# Patient Record
Sex: Female | Born: 1965 | Race: White | Hispanic: No | Marital: Married | Smoking: Former smoker
Health system: Southern US, Community
[De-identification: ages and names within clinical notes are randomized; demographics above are authoritative.]

## PROBLEM LIST (undated history)

## (undated) DIAGNOSIS — M1990 Unspecified osteoarthritis, unspecified site: Secondary | ICD-10-CM

## (undated) DIAGNOSIS — Z973 Presence of spectacles and contact lenses: Secondary | ICD-10-CM

## (undated) DIAGNOSIS — I10 Essential (primary) hypertension: Secondary | ICD-10-CM

## (undated) DIAGNOSIS — E785 Hyperlipidemia, unspecified: Secondary | ICD-10-CM

## (undated) DIAGNOSIS — N3281 Overactive bladder: Secondary | ICD-10-CM

## (undated) DIAGNOSIS — Z860101 Personal history of adenomatous and serrated colon polyps: Secondary | ICD-10-CM

## (undated) DIAGNOSIS — F419 Anxiety disorder, unspecified: Secondary | ICD-10-CM

## (undated) DIAGNOSIS — E782 Mixed hyperlipidemia: Secondary | ICD-10-CM

## (undated) DIAGNOSIS — J302 Other seasonal allergic rhinitis: Secondary | ICD-10-CM

## (undated) DIAGNOSIS — K219 Gastro-esophageal reflux disease without esophagitis: Secondary | ICD-10-CM

## (undated) DIAGNOSIS — F329 Major depressive disorder, single episode, unspecified: Secondary | ICD-10-CM

## (undated) DIAGNOSIS — N3946 Mixed incontinence: Secondary | ICD-10-CM

## (undated) DIAGNOSIS — Z86010 Personal history of colonic polyps: Secondary | ICD-10-CM

## (undated) DIAGNOSIS — J449 Chronic obstructive pulmonary disease, unspecified: Secondary | ICD-10-CM

## (undated) DIAGNOSIS — F411 Generalized anxiety disorder: Secondary | ICD-10-CM

## (undated) HISTORY — DX: Hyperlipidemia, unspecified: E78.5

## (undated) HISTORY — PX: ANTERIOR CERVICAL DECOMP/DISCECTOMY FUSION: SHX1161

## (undated) HISTORY — PX: TUBAL LIGATION: SHX77

## (undated) HISTORY — PX: OTHER SURGICAL HISTORY: SHX169

## (undated) HISTORY — PX: CHOLECYSTECTOMY: SHX55

## (undated) HISTORY — PX: DEBRIDEMENT TENNIS ELBOW: SHX1442

## (undated) HISTORY — PX: ELBOW SURGERY: SHX618

## (undated) HISTORY — DX: Chronic obstructive pulmonary disease, unspecified: J44.9

## (undated) HISTORY — PX: ABDOMINAL HYSTERECTOMY: SHX81

## (undated) HISTORY — PX: TONSILLECTOMY: SUR1361

## (undated) HISTORY — DX: Anxiety disorder, unspecified: F41.9

## (undated) HISTORY — PX: LAPAROSCOPIC HYSTERECTOMY: SHX1926

---
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
## 1998-02-12 ENCOUNTER — Ambulatory Visit (HOSPITAL_COMMUNITY): Admission: RE | Admit: 1998-02-12 | Discharge: 1998-02-12 | Payer: Self-pay | Admitting: Obstetrics and Gynecology

## 2001-11-01 HISTORY — PX: LAPAROSCOPIC ASSISTED VAGINAL HYSTERECTOMY: SHX5398

## 2004-09-09 ENCOUNTER — Ambulatory Visit (HOSPITAL_COMMUNITY): Admission: RE | Admit: 2004-09-09 | Discharge: 2004-09-09 | Payer: Self-pay | Admitting: Orthopedic Surgery

## 2004-12-16 ENCOUNTER — Ambulatory Visit: Payer: Self-pay | Admitting: Family Medicine

## 2005-09-28 IMAGING — CR DG ORBITS FOR FOREIGN BODY
2 series · 2 of 2 positions shown · non-contrast
Comparison: none

CLINICAL DATA: Pre-MRI.
 EYE FOR FOREIGN BODY:
 There is no evidence of metal or radiopaque foreign body within the orbits.  No significant bone abnormalities are identified.
 IMPRESSION
 No evidence of metallic foreign body.

[view not recorded (1 of 2)]
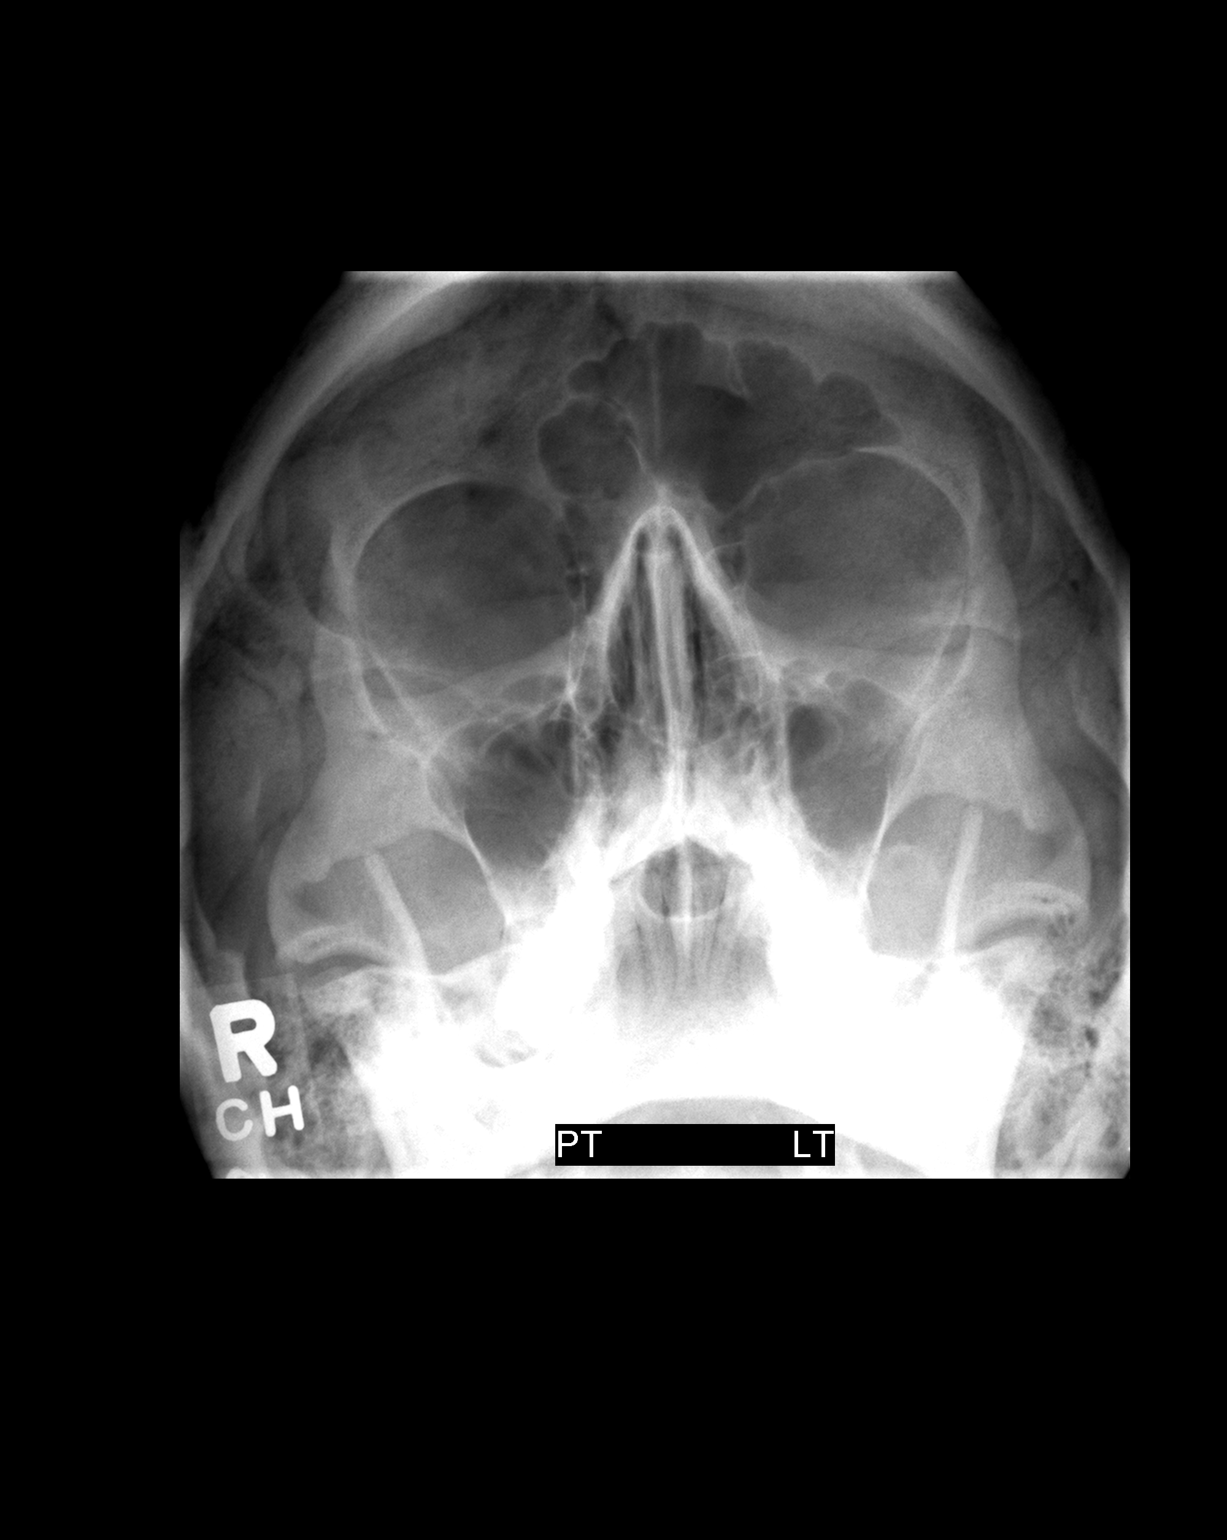

[view not recorded (2 of 2)]
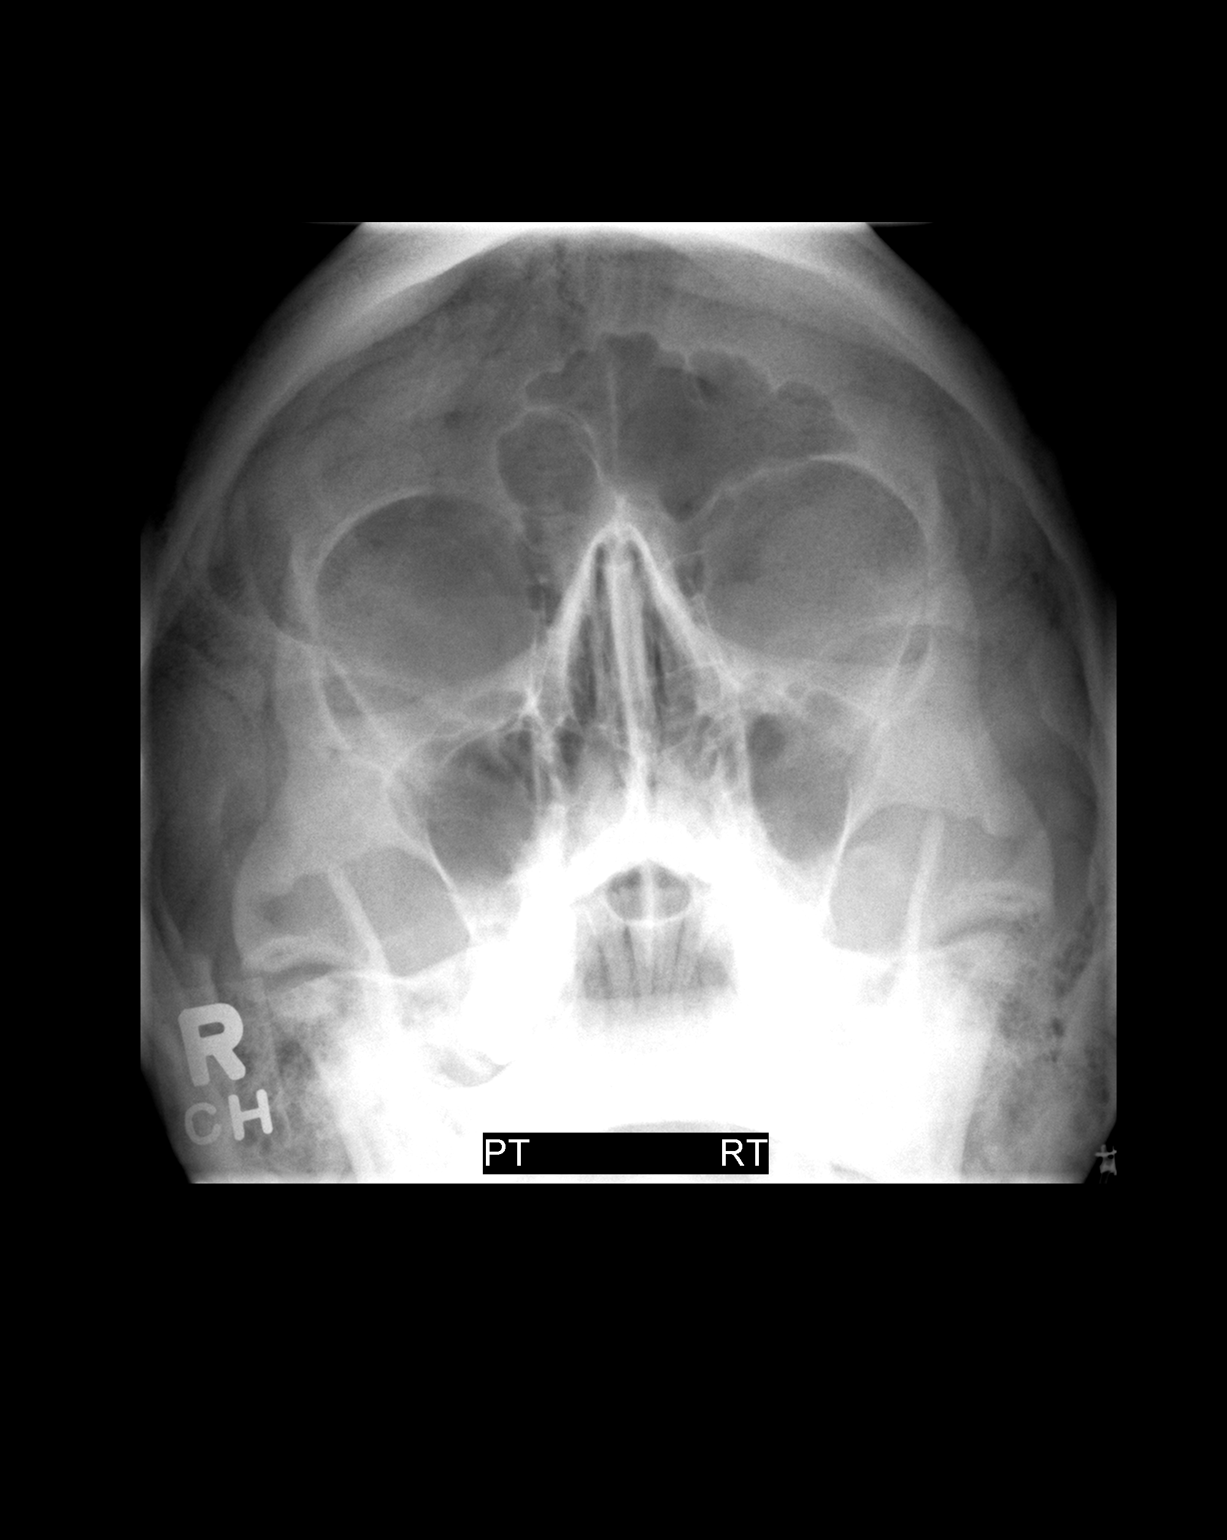

[2 of 2 positions shown; findings below may reference images not displayed]

## 2006-04-04 ENCOUNTER — Ambulatory Visit (HOSPITAL_COMMUNITY): Admission: RE | Admit: 2006-04-04 | Discharge: 2006-04-04 | Payer: Self-pay | Admitting: Orthopedic Surgery

## 2007-02-14 ENCOUNTER — Ambulatory Visit (HOSPITAL_COMMUNITY): Admission: RE | Admit: 2007-02-14 | Discharge: 2007-02-15 | Payer: Self-pay | Admitting: Neurosurgery

## 2007-02-14 HISTORY — PX: ANTERIOR CERVICAL DECOMP/DISCECTOMY FUSION: SHX1161

## 2007-04-20 ENCOUNTER — Encounter: Admission: RE | Admit: 2007-04-20 | Discharge: 2007-04-20 | Payer: Self-pay | Admitting: Neurosurgery

## 2007-12-12 ENCOUNTER — Encounter: Admission: RE | Admit: 2007-12-12 | Discharge: 2007-12-12 | Payer: Self-pay | Admitting: Neurosurgery

## 2007-12-20 ENCOUNTER — Encounter: Admission: RE | Admit: 2007-12-20 | Discharge: 2007-12-20 | Payer: Self-pay | Admitting: Neurosurgery

## 2008-03-04 IMAGING — RF DG CERVICAL SPINE 1V
1 series · 1 of 1 positions shown · non-contrast
Comparison: none

CLINICAL DATA: Anterior fusion.
 CERVICAL SPINE ? 1 VIEW ? 02/14/07:

[Series 1: run · 1 of 1 slices shown]
[im 1/1]
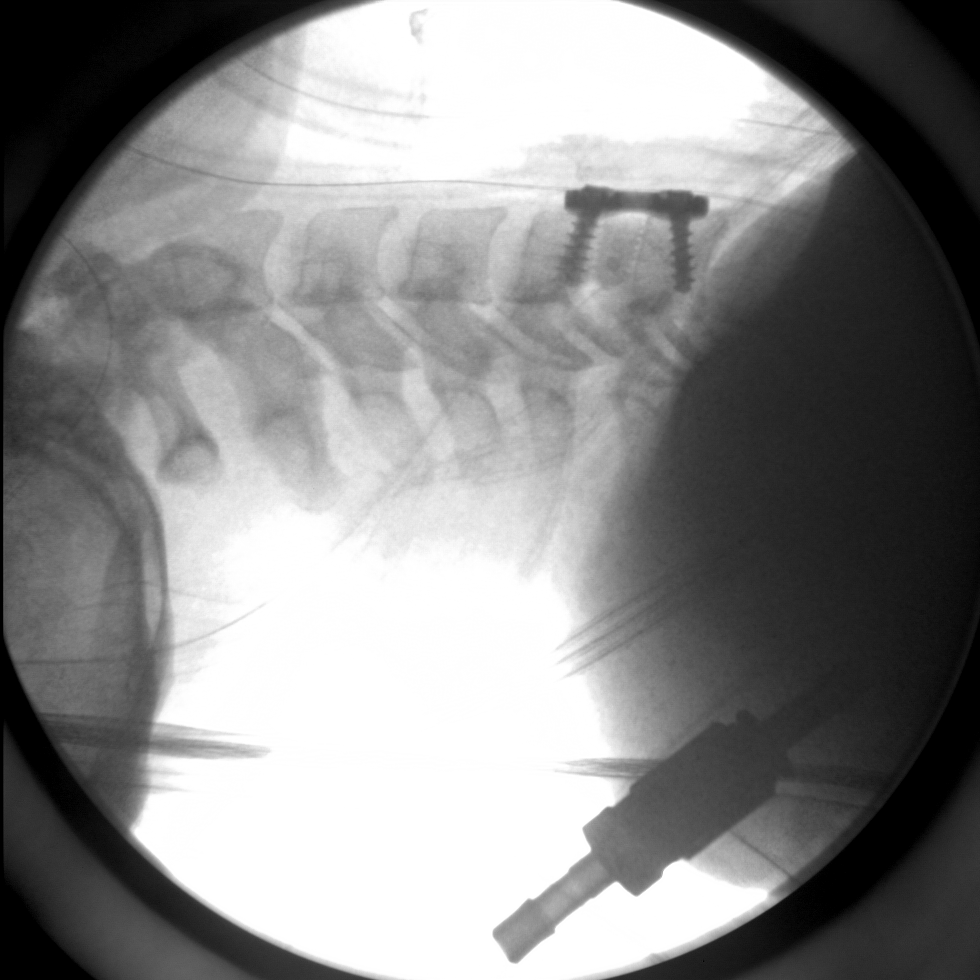

[1 of 1 positions shown; findings below may reference images not displayed]

FINDINGS: A C-arm spot film returned from the operating room shows anterior fusion at C5-6. The interbody fusion plug is in good position and normal alignment is maintained.
IMPRESSION: Anterior fusion at C5-6.

## 2010-01-09 ENCOUNTER — Encounter: Admission: RE | Admit: 2010-01-09 | Discharge: 2010-01-09 | Payer: Self-pay | Admitting: Neurosurgery

## 2010-04-28 ENCOUNTER — Ambulatory Visit (HOSPITAL_COMMUNITY): Admission: RE | Admit: 2010-04-28 | Discharge: 2010-04-28 | Payer: Self-pay | Admitting: Neurosurgery

## 2010-04-28 HISTORY — PX: CERVICAL SPINE SURGERY: SHX589

## 2010-06-18 ENCOUNTER — Encounter: Admission: RE | Admit: 2010-06-18 | Discharge: 2010-06-18 | Payer: Self-pay | Admitting: Neurosurgery

## 2010-09-09 ENCOUNTER — Encounter: Admission: RE | Admit: 2010-09-09 | Discharge: 2010-09-09 | Payer: Self-pay | Admitting: Neurosurgery

## 2011-01-17 LAB — CBC
HCT: 39.2 % (ref 36.0–46.0)
Hemoglobin: 13.6 g/dL (ref 12.0–15.0)
MCH: 32.2 pg (ref 26.0–34.0)
MCHC: 34.6 g/dL (ref 30.0–36.0)
MCV: 93 fL (ref 78.0–100.0)
Platelets: 149 10*3/uL — ABNORMAL LOW (ref 150–400)
RBC: 4.22 MIL/uL (ref 3.87–5.11)
RDW: 13.4 % (ref 11.5–15.5)
WBC: 8.1 10*3/uL (ref 4.0–10.5)

## 2011-01-17 LAB — TYPE AND SCREEN
ABO/RH(D): A POS
Antibody Screen: NEGATIVE

## 2011-01-17 LAB — DIFFERENTIAL
Basophils Absolute: 0 10*3/uL (ref 0.0–0.1)
Basophils Relative: 0 % (ref 0–1)
Eosinophils Absolute: 0.1 10*3/uL (ref 0.0–0.7)
Eosinophils Relative: 1 % (ref 0–5)
Lymphocytes Relative: 26 % (ref 12–46)
Lymphs Abs: 2.1 10*3/uL (ref 0.7–4.0)
Monocytes Absolute: 0.5 10*3/uL (ref 0.1–1.0)
Monocytes Relative: 6 % (ref 3–12)
Neutro Abs: 5.4 10*3/uL (ref 1.7–7.7)
Neutrophils Relative %: 66 % (ref 43–77)

## 2011-01-17 LAB — SURGICAL PCR SCREEN
MRSA, PCR: NEGATIVE
Staphylococcus aureus: POSITIVE — AB

## 2011-03-19 NOTE — Op Note (Signed)
Colleen Larsen, Colleen Larsen             ACCOUNT NO.:  [id number]   MEDICAL RECORD NO.:  [id number]          PATIENT TYPE:  AMB   LOCATION:  SDS                          FACILITY:  [facility]   PHYSICIAN:  Henry A. Pool, M.D.    DATE OF BIRTH:  Nov 13, 1965   DATE OF PROCEDURE:  02/14/2007  DATE OF DISCHARGE:                               OPERATIVE REPORT   PREOPERATIVE DIAGNOSIS:  Left C5-C6 herniated pulposus with  radiculopathy.   POSTOPERATIVE DIAGNOSIS:  Left C5-C6 herniated pulposus with  radiculopathy.   PROCEDURE NOTE:  C5-C6 anterior cervical discectomy fusion allograft and  plating.   SURGEON:  Kathaleen Maser. Pool, M.D.   ASSISTANT:  Reinaldo Meeker, M.D.   ANESTHESIA:  General.   INDICATIONS FOR PROCEDURE:  Colleen Larsen is a 45 year old female with  history of neck and left upper extremity pain consistent with a left  sided C6 radiculopathy.  Workup demonstrates evidence of a large left  sided C5-C6 disc herniation with compression of the left lateral aspect  of the spinal cord and exiting left sided C6 nerve root.  We discussed  the options of management including the possibility of undergoing C5-C6  anterior cervical discectomy and fusion with allograft and plating in  hopes of improving her symptoms.  The patient is aware of the risks and  benefits and wishes to proceed.   OPERATIVE NOTE:  The patient was taken to the operating room and placed  on the operating table in the supine position.  After anesthesia was  achieved, the patient was positioned supine with the neck slightly  extended and held in place with halter traction.  The patient's anterior  cervical region was prepped and draped sterilely.  A 10 blade was used  make a linear incision overlying the C5-C6 interspace.  This was carried  down sharply to the platysma.  The platysma was then divided vertically  and dissection proceeded along the medial border of the sternomastoid  muscle and carotid sheath.  The trachea  and esophagus were mobilized and  retracted towards the left.  The prevertebral fascia was stripped off  the anterior spinal column.  The longus coli muscle was elevated  bilaterally using electrocautery.  Deep self-retaining retractor was  placed.  Intraoperative fluoroscopy was used.  The C5-C6 level was  confirmed.   The disc space was then incised in a rectangular fracture.  Wide disc  space clean out was then achieved using pituitary rongeurs, Carlens  curets, Kerrison rongeurs, and a high speed drill.  All elements of the  disc were removed down to the posterior annulus.  The  microscope was  brought on the field and used throughout the remainder of discectomy.  The remaining aspects of the annulus and osteophytes were removed using  the high speed drill down to the level of the posterior longitudinal  ligament.  The posterior longitudinal ligament was then elevated and  resected in a piecemeal fashion using Kerrison rongeurs.  The thecal sac  was then identified.  A wide central decompression was then performed by  undercutting the bodies of C5 and C6.  Decompression proceeded to each  neural foramen.  All elements of the disc herniation off the left side  were completely resected.  Wide anterior foraminotomies were then  performed along the course of the exiting C6 nerve root bilaterally.   At this point, a very thorough decompression had been achieved.  There  was no evidence of injury to the thecal sac or nerve roots.  The wound  was then irrigated with antibiotic solution.  Gelfoam was placed  topically for hemostasis which was found be good.  A 5 mm Lifenet  allograft wedge was then impacted into place and recessed approximately  1 mm from the anterior cortical margin.  A 23 mm Atlantis anterior  cervical plate was then placed over the C5 and C6 levels.  This was  attached under fluoroscopic guidance using 13 mm variable angle screws,  two each at both levels.  All four  screws were given final tightening  and found be solid in bone.  The locking screws were engaged at both  levels.  Final images revealed good position of the bone graft and  hardware.  The wound was then irrigated with antibiotic solution.  Hemostasis was then achieved with bipolar electrocautery.  The wound was  then closed in layers.  Steri-Strips and sterile dressing were applied.  There were no operative complications.  The patient tolerated the  procedure well and returns to the recovery room for postoperative care.           ______________________________  Kathaleen Maser. Pool, M.D.     HAP/MEDQ  D:  02/14/2007  T:  02/14/2007  Job:  [id number]

## 2014-08-14 DIAGNOSIS — I10 Essential (primary) hypertension: Secondary | ICD-10-CM | POA: Insufficient documentation

## 2014-08-21 DIAGNOSIS — E785 Hyperlipidemia, unspecified: Secondary | ICD-10-CM | POA: Insufficient documentation

## 2018-04-05 DIAGNOSIS — E559 Vitamin D deficiency, unspecified: Secondary | ICD-10-CM | POA: Insufficient documentation

## 2018-05-08 NOTE — Patient Instructions (Addendum)
Colleen MasonBarbara B Larsen  05/08/2018   Your procedure is scheduled on: 05-17-18  [facility]             Report to admitting at    0630  AM    Call this number if you have problems the morning of surgery [phone]    Remember: Do not eat food or drink liquids :After Midnight.     Take these medicines the morning of surgery with A SIP OF WATER: RANTIDINE, ZYRTEC, FLUTICASONE NASAL SPRAY                                You may not have any metal on your body including hair pins and              piercings  Do not wear jewelry, make-up, lotions, powders or perfumes, deodorant             Do not wear nail polish.  Do not shave  48 hours prior to surgery.              Do not bring valuables to the hospital. [facility] IS NOT             RESPONSIBLE   FOR VALUABLES.  Contacts, dentures or bridgework may not be worn into surgery.  Leave suitcase in the car. After surgery it may be brought to your room.               Please read over the following fact sheets you were given: _____________________________________________________________________           [facility] - Pre[facility] Before surgery, you can play an important role.  Because skin is not sterile, your skin needs to be as free of germs as possible.  You can reduce the number of germs on your skin by washing with CHG (chlorahexidine gluconate) soap before surgery.  CHG is an antiseptic cleaner which kills germs and bonds with the skin to continue killing germs even after washing. Please DO NOT use if you have an allergy to CHG or antibacterial soaps.  If your skin becomes reddened/irritated stop using the CHG and inform your nurse when you arrive at Short Stay. Do not shave (including legs and underarms) for at least 48 hours prior to the first CHG shower.  You may shave your face/neck. Please follow these instructions carefully:  1.  Shower with CHG Soap the night before surgery  and the  morning of Surgery.  2.  If you choose to wash your hair, wash your hair first as usual with your  normal  shampoo.  3.  After you shampoo, rinse your hair and body thoroughly to remove the  shampoo.                           4.  Use CHG as you would any other liquid soap.  You can apply chg directly  to the skin and wash                       Gently with a scrungie or clean washcloth.  5.  Apply the CHG Soap to your body ONLY FROM THE NECK DOWN.   Do not use on face/ open  Wound or open sores. Avoid contact with eyes, ears mouth and genitals (private parts).                       Wash face,  Genitals (private parts) with your normal soap.             6.  Wash thoroughly, paying special attention to the area where your surgery  will be performed.  7.  Thoroughly rinse your body with warm water from the neck down.  8.  DO NOT shower/wash with your normal soap after using and rinsing off  the CHG Soap.                9.  Pat yourself dry with a clean towel.            10.  Wear clean pajamas.            11.  Place clean sheets on your bed the night of your first shower and do not  sleep with pets. Day of Surgery : Do not apply any lotions/deodorants the morning of surgery.  Please wear clean clothes to the hospital/surgery center.  FAILURE TO FOLLOW THESE INSTRUCTIONS MAY RESULT IN THE CANCELLATION OF YOUR SURGERY PATIENT SIGNATURE_________________________________  NURSE SIGNATURE__________________________________  ________________________________________________________________________  WHAT IS A BLOOD TRANSFUSION? Blood Transfusion Information  A transfusion is the replacement of blood or some of its parts. Blood is made up of multiple cells which provide different functions.  Red blood cells carry oxygen and are used for blood loss replacement.  White blood cells fight against infection.  Platelets control bleeding.  Plasma helps clot  blood.  Other blood products are available for specialized needs, such as hemophilia or other clotting disorders. BEFORE THE TRANSFUSION  Who gives blood for transfusions?   Healthy volunteers who are fully evaluated to make sure their blood is safe. This is blood bank blood. Transfusion therapy is the safest it has ever been in the practice of medicine. Before blood is taken from a donor, a complete history is taken to make sure that person has no history of diseases nor engages in risky social behavior (examples are intravenous drug use or sexual activity with multiple partners). The donor's travel history is screened to minimize risk of transmitting infections, such as malaria. The donated blood is tested for signs of infectious diseases, such as HIV and hepatitis. The blood is then tested to be sure it is compatible with you in order to minimize the chance of a transfusion reaction. If you or a relative donates blood, this is often done in anticipation of surgery and is not appropriate for emergency situations. It takes many days to process the donated blood. RISKS AND COMPLICATIONS Although transfusion therapy is very safe and saves many lives, the main dangers of transfusion include:   Getting an infectious disease.  Developing a transfusion reaction. This is an allergic reaction to something in the blood you were given. Every precaution is taken to prevent this. The decision to have a blood transfusion has been considered carefully by your caregiver before blood is given. Blood is not given unless the benefits outweigh the risks. AFTER THE TRANSFUSION  Right after receiving a blood transfusion, you will usually feel much better and more energetic. This is especially true if your red blood cells have gotten low (anemic). The transfusion raises the level of the red blood cells which carry oxygen, and this usually causes an energy increase.  The  nurse administering the transfusion will monitor  you carefully for complications. HOME CARE INSTRUCTIONS  No special instructions are needed after a transfusion. You may find your energy is better. Speak with your caregiver about any limitations on activity for underlying diseases you may have. SEEK MEDICAL CARE IF:   Your condition is not improving after your transfusion.  You develop redness or irritation at the intravenous (IV) site. SEEK IMMEDIATE MEDICAL CARE IF:  Any of the following symptoms occur over the next 12 hours:  Shaking chills.  You have a temperature by mouth above 102 F (38.9 C), not controlled by medicine.  Chest, back, or muscle pain.  People around you feel you are not acting correctly or are confused.  Shortness of breath or difficulty breathing.  Dizziness and fainting.  You get a rash or develop hives.  You have a decrease in urine output.  Your urine turns a dark color or changes to pink, red, or brown. Any of the following symptoms occur over the next 10 days:  You have a temperature by mouth above 102 F (38.9 C), not controlled by medicine.  Shortness of breath.  Weakness after normal activity.  The white part of the eye turns yellow (jaundice).  You have a decrease in the amount of urine or are urinating less often.  Your urine turns a dark color or changes to pink, red, or brown. Document Released: 10/15/2000 Document Revised: 01/10/2012 Document Reviewed: 06/03/2008 ExitCare Patient Information 2014 [place].  _______________________________________________________________________  Incentive Spirometer  An incentive spirometer is a tool that can help keep your lungs clear and active. This tool measures how well you are filling your lungs with each breath. Taking long deep breaths may help reverse or decrease the chance of developing breathing (pulmonary) problems (especially infection) following:  A long period of time when you are unable to move or be active. BEFORE THE  PROCEDURE   If the spirometer includes an indicator to show your best effort, your nurse or respiratory therapist will set it to a desired goal.  If possible, sit up straight or lean slightly forward. Try not to slouch.  Hold the incentive spirometer in an upright position. INSTRUCTIONS FOR USE  1. Sit on the edge of your bed if possible, or sit up as far as you can in bed or on a chair. 2. Hold the incentive spirometer in an upright position. 3. Breathe out normally. 4. Place the mouthpiece in your mouth and seal your lips tightly around it. 5. Breathe in slowly and as deeply as possible, raising the piston or the ball toward the top of the column. 6. Hold your breath for 3-5 seconds or for as long as possible. Allow the piston or ball to fall to the bottom of the column. 7. Remove the mouthpiece from your mouth and breathe out normally. 8. Rest for a few seconds and repeat Steps 1 through 7 at least 10 times every 1-2 hours when you are awake. Take your time and take a few normal breaths between deep breaths. 9. The spirometer may include an indicator to show your best effort. Use the indicator as a goal to work toward during each repetition. 10. After each set of 10 deep breaths, practice coughing to be sure your lungs are clear. If you have an incision (the cut made at the time of surgery), support your incision when coughing by placing a pillow or rolled up towels firmly against it. Once you are able to get  out of bed, walk around indoors and cough well. You may stop using the incentive spirometer when instructed by your caregiver.  RISKS AND COMPLICATIONS  Take your time so you do not get dizzy or light-headed.  If you are in pain, you may need to take or ask for pain medication before doing incentive spirometry. It is harder to take a deep breath if you are having pain. AFTER USE  Rest and breathe slowly and easily.  It can be helpful to keep track of a log of your progress. Your  caregiver can provide you with a simple table to help with this. If you are using the spirometer at home, follow these instructions: [place] IF:   You are having difficultly using the spirometer.  You have trouble using the spirometer as often as instructed.  Your pain medication is not giving enough relief while using the spirometer.  You develop fever of 100.5 F (38.1 C) or higher. SEEK IMMEDIATE MEDICAL CARE IF:   You cough up bloody sputum that had not been present before.  You develop fever of 102 F (38.9 C) or greater.  You develop worsening pain at or near the incision site. MAKE SURE YOU:   Understand these instructions.  Will watch your condition.  Will get help right away if you are not doing well or get worse. Document Released: 02/28/2007 Document Revised: 01/10/2012 Document Reviewed: 05/01/2007 [employer] Patient Information 2014 [place], [place].   ________________________________________________________________________

## 2018-05-10 ENCOUNTER — Encounter (HOSPITAL_COMMUNITY)
Admission: RE | Admit: 2018-05-10 | Discharge: 2018-05-10 | Disposition: A | Discharge status: Home / Self Care | Payer: BLUE CROSS/BLUE SHIELD | Source: Ambulatory Visit | Attending: Orthopedic Surgery | Admitting: Orthopedic Surgery

## 2018-05-10 ENCOUNTER — Encounter (HOSPITAL_COMMUNITY): Payer: Self-pay

## 2018-05-10 ENCOUNTER — Other Ambulatory Visit: Payer: Self-pay

## 2018-05-10 ENCOUNTER — Other Ambulatory Visit (HOSPITAL_COMMUNITY): Payer: Self-pay | Admitting: *Deleted

## 2018-05-10 DIAGNOSIS — M1711 Unilateral primary osteoarthritis, right knee: Secondary | ICD-10-CM | POA: Diagnosis not present

## 2018-05-10 DIAGNOSIS — I10 Essential (primary) hypertension: Secondary | ICD-10-CM | POA: Insufficient documentation

## 2018-05-10 DIAGNOSIS — Z01810 Encounter for preprocedural cardiovascular examination: Secondary | ICD-10-CM | POA: Insufficient documentation

## 2018-05-10 DIAGNOSIS — R9431 Abnormal electrocardiogram [ECG] [EKG]: Secondary | ICD-10-CM | POA: Diagnosis not present

## 2018-05-10 DIAGNOSIS — Z01812 Encounter for preprocedural laboratory examination: Secondary | ICD-10-CM | POA: Diagnosis not present

## 2018-05-10 HISTORY — DX: Essential (primary) hypertension: I10

## 2018-05-10 HISTORY — DX: Gastro-esophageal reflux disease without esophagitis: K21.9

## 2018-05-10 HISTORY — DX: Unspecified osteoarthritis, unspecified site: M19.90

## 2018-05-10 LAB — COMPREHENSIVE METABOLIC PANEL
ALT: 20 U/L (ref 0–44)
AST: 25 U/L (ref 15–41)
Albumin: 4.1 g/dL (ref 3.5–5.0)
Alkaline Phosphatase: 94 U/L (ref 38–126)
Anion gap: 11 (ref 5–15)
BUN: 13 mg/dL (ref 6–20)
CO2: 28 mmol/L (ref 22–32)
Calcium: 9.1 mg/dL (ref 8.9–10.3)
Chloride: 100 mmol/L (ref 98–111)
Creatinine, Ser: 0.75 mg/dL (ref 0.44–1.00)
GFR calc Af Amer: >60 mL/min (ref >60)
GFR calc non Af Amer: >60 mL/min (ref >60)
Glucose, Bld: 114 mg/dL — ABNORMAL HIGH (ref 70–99)
Potassium: 2.7 mmol/L — CL (ref 3.5–5.1)
Sodium: 139 mmol/L (ref 135–145)
Total Bilirubin: 0.5 mg/dL (ref 0.3–1.2)
Total Protein: 7.2 g/dL (ref 6.5–8.1)

## 2018-05-10 LAB — CBC WITH DIFFERENTIAL/PLATELET
Basophils Absolute: 0 10*3/uL (ref 0.0–0.1)
Basophils Relative: 0 %
Eosinophils Absolute: 0.2 10*3/uL (ref 0.0–0.7)
Eosinophils Relative: 2 %
HCT: 38.5 % (ref 36.0–46.0)
Hemoglobin: 13.4 g/dL (ref 12.0–15.0)
Lymphocytes Relative: 28 %
Lymphs Abs: 2.5 10*3/uL (ref 0.7–4.0)
MCH: 30.8 pg (ref 26.0–34.0)
MCHC: 34.8 g/dL (ref 30.0–36.0)
MCV: 88.5 fL (ref 78.0–100.0)
Monocytes Absolute: 0.6 10*3/uL (ref 0.1–1.0)
Monocytes Relative: 6 %
Neutro Abs: 5.8 10*3/uL (ref 1.7–7.7)
Neutrophils Relative %: 64 %
Platelets: 195 10*3/uL (ref 150–400)
RBC: 4.35 MIL/uL (ref 3.87–5.11)
RDW: 13.5 % (ref 11.5–15.5)
WBC: 9.1 10*3/uL (ref 4.0–10.5)

## 2018-05-10 LAB — URINALYSIS, ROUTINE W REFLEX MICROSCOPIC
Bacteria, UA: NONE SEEN
Bilirubin Urine: NEGATIVE
Glucose, UA: NEGATIVE mg/dL
Ketones, ur: NEGATIVE mg/dL
Leukocytes, UA: NEGATIVE
Nitrite: NEGATIVE
Protein, ur: NEGATIVE mg/dL
Specific Gravity, Urine: 1.006 (ref 1.005–1.030)
pH: 6 (ref 5.0–8.0)

## 2018-05-10 LAB — ABO/RH: ABO/RH(D): A POS

## 2018-05-10 LAB — PROTIME-INR
INR: 0.87
Prothrombin Time: 11.8 seconds (ref 11.4–15.2)

## 2018-05-10 LAB — APTT: aPTT: 27 seconds (ref 24–36)

## 2018-05-10 LAB — SURGICAL PCR SCREEN
MRSA, PCR: NEGATIVE
STAPHYLOCOCCUS AUREUS: NEGATIVE

## 2018-05-10 NOTE — Progress Notes (Signed)
SURGICAL CLEARANCE DR Eula FriedSLATE 04-05-18 ON CHART

## 2018-05-10 NOTE — Progress Notes (Signed)
CRITICAL VALUE ALERT  Critical Value potassium 2..7 Date & Time Notied: 05-10-18   Provider Notified: amber constable pa  Orders Received/Actions taken: amber constable to call in potassium tablets for patient and order received to repeat potassium level day of surgery and order placed in epic to repeat potassium day of surgery

## 2018-05-16 MED ORDER — BUPIVACAINE LIPOSOME 1.3 % IJ SUSP
20.0000 mL | Freq: Once | INTRAMUSCULAR | Status: DC
  Filled 2018-05-16: qty 20

## 2018-05-16 MED ORDER — TRANEXAMIC ACID 1000 MG/10ML IV SOLN
1000.0000 mg | INTRAVENOUS | Status: DC
  Filled 2018-05-16: qty 10

## 2018-05-16 MED ORDER — TRANEXAMIC ACID 1000 MG/10ML IV SOLN
1000.0000 mg | INTRAVENOUS | Status: AC
  Administered 2018-05-17: 1000 mg via INTRAVENOUS
  Filled 2018-05-16: qty 1100

## 2018-05-16 NOTE — H&P (Signed)
TOTAL KNEE ADMISSION H&P  Patient is being admitted for right total knee arthroplasty.  Subjective:  Chief Complaint:right knee pain.  HPI: Colleen Larsen, 52 y.o. female, has a history of pain and functional disability in the right knee due to arthritis and has failed non-surgical conservative treatments for greater than 12 weeks to includeNSAID's and/or analgesics, corticosteriod injections, flexibility and strengthening excercises and activity modification.  Onset of symptoms was gradual, starting 5 years ago with gradually worsening course since that time. The patient noted prior procedures on the knee to include  arthroscopy and menisectomy on the right knee(s).  Patient currently rates pain in the right knee(s) at 5 out of 10 with activity. Patient has night pain, worsening of pain with activity and weight bearing, pain that interferes with activities of daily living, pain with passive range of motion, crepitus and joint swelling.  Patient has evidence of periarticular osteophytes and joint space narrowing by imaging studies. There is no active infection.   Past Medical History:  Diagnosis Date  . Arthritis    OA BOTH KNEES  . GERD (gastroesophageal reflux disease)   . Hypertension     Past Surgical History:  Procedure Laterality Date  . ABDOMINAL HYSTERECTOMY  YRS AGO   PARTIAL  . ANTERIOR CERVICAL DECOMP/DISCECTOMY FUSION  YRS AGO   WITH CADAVER BONE GRAFT  . BILATERAL CARPAL TUNNLE RELEASE  YRS AGO  . CHOLECYSTECTOMY  AGE 41   OPEN  . ELBOW SURGERY Right YRS AGO  . TUBAL LIGATION  YRS AGO    No current facility-administered medications for this encounter.    Current Outpatient Medications  Medication Sig Dispense Refill Last Dose  . cetirizine (ZYRTEC) 10 MG tablet Take 10 mg by mouth daily.     Marland Kitchen. estradiol (VIVELLE-DOT) 0.0375 MG/24HR Place 1 patch onto the skin 2 (two) times a week.  5   . hydrochlorothiazide (HYDRODIURIL) 25 MG tablet Take 25 mg by mouth daily.      Marland Kitchen. ibuprofen (ADVIL,MOTRIN) 200 MG tablet Take 600 mg by mouth every 4 (four) hours as needed for headache or moderate pain.     Marland Kitchen. losartan (COZAAR) 50 MG tablet Take 50 mg by mouth 2 (two) times daily.     . nabumetone (RELAFEN) 500 MG tablet Take 500 mg by mouth 2 (two) times daily.     . pravastatin (PRAVACHOL) 40 MG tablet Take 40 mg by mouth at bedtime.     . ranitidine (ZANTAC) 150 MG tablet Take 150 mg by mouth daily as needed for heartburn.     . Cholecalciferol (VITAMIN D) 2000 units tablet Take 4,000 Units by mouth daily.     . fluticasone (FLONASE) 50 MCG/ACT nasal spray Place 2 sprays into both nostrils 2 (two) times daily.      Allergies  Allergen Reactions  . Celebrex [Celecoxib] Hives    Social History   Tobacco Use  . Smoking status: Current Every Day Smoker    Packs/day: 1.00    Years: 30.00    Pack years: 30.00    Types: Cigarettes  . Smokeless tobacco: Never Used  Substance Use Topics  . Alcohol use: Yes    Comment: RARE    Review of Systems  Constitutional: Negative.   HENT: Negative.   Eyes: Negative.   Respiratory: Negative.   Cardiovascular: Negative.   Gastrointestinal: Negative.   Genitourinary: Negative.   Musculoskeletal: Positive for joint pain and myalgias. Negative for back pain, falls and neck pain.  Skin: Negative.  Neurological: Negative.   Endo/Heme/Allergies: Negative.   Psychiatric/Behavioral: Negative.     Objective:  Physical Exam  Constitutional: She is oriented to person, place, and time. She appears well-developed. No distress.  Obese  HENT:  Head: Normocephalic and atraumatic.  Right Ear: External ear normal.  Left Ear: External ear normal.  Nose: Nose normal.  Mouth/Throat: Oropharynx is clear and moist.  Eyes: Conjunctivae and EOM are normal.  Neck: Normal range of motion. Neck supple.  Cardiovascular: Normal rate, regular rhythm, normal heart sounds and intact distal pulses.  No murmur heard. Respiratory: Effort  normal and breath sounds normal. No respiratory distress. She has no wheezes.  GI: Soft. Bowel sounds are normal. She exhibits no distension. There is no tenderness.  Musculoskeletal:  Moderate tenderness to palpation about the medial and lateral joint lines of the right knee. ROM right knee 5-120 degrees. Mild effusion noted in the right knee. No instability about the right knee. Severe patellofemoral crepitus in the right knee. Normal painless motion of the hips.   Neurological: She is alert and oriented to person, place, and time. She has normal strength and normal reflexes. No sensory deficit.  Skin: No rash noted. She is not diaphoretic. No erythema.  Psychiatric: She has a normal mood and affect. Her behavior is normal.    Ht: 5 ft 5 in Wt: 183 lbs  BMI: 30.5  BP: 136/84  Pulse: 76 bpm   Imaging Review Plain radiographs demonstrate severe degenerative joint disease of the right knee(s). The overall alignment ismild varus. The bone quality appears to be good for age and reported activity level.   Preoperative templating of the joint replacement has been completed, documented, and submitted to the Operating Room personnel in order to optimize intra-operative equipment management.   Anticipated LOS equal to or greater than 2 midnights due to - Age 51 and older with one or more of the following:  - Obesity  - Expected need for hospital services (PT, OT, Nursing) required for safe  discharge  - Anticipated need for postoperative skilled nursing care or inpatient rehab  - Active co-morbidities: None OR   - Unanticipated findings during/Post Surgery: None  - Patient is a high risk of re-admission due to: None     Assessment/Plan:  End stage primary osteoarthritis, right knee   The patient history, physical examination, clinical judgment of the provider and imaging studies are consistent with end stage degenerative joint disease of the right knee(s) and total knee arthroplasty  is deemed medically necessary. The treatment options including medical management, injection therapy arthroscopy and arthroplasty were discussed at length. The risks and benefits of total knee arthroplasty were presented and reviewed. The risks due to aseptic loosening, infection, stiffness, patella tracking problems, thromboembolic complications and other imponderables were discussed. The patient acknowledged the explanation, agreed to proceed with the plan and consent was signed. Patient is being admitted for inpatient treatment for surgery, pain control, PT, OT, prophylactic antibiotics, VTE prophylaxis, progressive ambulation and ADL's and discharge planning. The patient is planning to be discharged home with outpatient therapy    Therapy Plans: outpatient therapy at [facility] Disposition: Home with husband Planned DVT prophylaxis: aspirin 325mg  BID DME needed: none PCP: Dr. Briscoe Burns Other: TXA IV no anesthesia concerns  Dimitri Ped, PA-C

## 2018-05-17 ENCOUNTER — Inpatient Hospital Stay (HOSPITAL_COMMUNITY): Payer: BLUE CROSS/BLUE SHIELD | Admitting: Anesthesiology

## 2018-05-17 ENCOUNTER — Encounter (HOSPITAL_COMMUNITY): Payer: Self-pay | Admitting: Anesthesiology

## 2018-05-17 ENCOUNTER — Inpatient Hospital Stay (HOSPITAL_COMMUNITY): Payer: BLUE CROSS/BLUE SHIELD

## 2018-05-17 ENCOUNTER — Other Ambulatory Visit: Payer: Self-pay

## 2018-05-17 ENCOUNTER — Encounter (HOSPITAL_COMMUNITY)
Admission: RE | Disposition: A | Discharge status: Home / Self Care | Payer: Self-pay | Source: Ambulatory Visit | Attending: Orthopedic Surgery

## 2018-05-17 ENCOUNTER — Inpatient Hospital Stay (HOSPITAL_COMMUNITY)
Admission: RE | Admit: 2018-05-17 | Discharge: 2018-05-19 | DRG: 470 | Disposition: A | Discharge status: Home / Self Care | Payer: BLUE CROSS/BLUE SHIELD | Source: Ambulatory Visit | Attending: Orthopedic Surgery | Admitting: Orthopedic Surgery

## 2018-05-17 DIAGNOSIS — K219 Gastro-esophageal reflux disease without esophagitis: Secondary | ICD-10-CM | POA: Diagnosis present

## 2018-05-17 DIAGNOSIS — Z6829 Body mass index (BMI) 29.0-29.9, adult: Secondary | ICD-10-CM | POA: Diagnosis not present

## 2018-05-17 DIAGNOSIS — Z7951 Long term (current) use of inhaled steroids: Secondary | ICD-10-CM | POA: Diagnosis not present

## 2018-05-17 DIAGNOSIS — Z96659 Presence of unspecified artificial knee joint: Secondary | ICD-10-CM

## 2018-05-17 DIAGNOSIS — Z9049 Acquired absence of other specified parts of digestive tract: Secondary | ICD-10-CM | POA: Diagnosis not present

## 2018-05-17 DIAGNOSIS — E669 Obesity, unspecified: Secondary | ICD-10-CM | POA: Diagnosis present

## 2018-05-17 DIAGNOSIS — F17210 Nicotine dependence, cigarettes, uncomplicated: Secondary | ICD-10-CM | POA: Diagnosis present

## 2018-05-17 DIAGNOSIS — I10 Essential (primary) hypertension: Secondary | ICD-10-CM | POA: Diagnosis present

## 2018-05-17 DIAGNOSIS — Z79899 Other long term (current) drug therapy: Secondary | ICD-10-CM | POA: Diagnosis not present

## 2018-05-17 DIAGNOSIS — M1711 Unilateral primary osteoarthritis, right knee: Principal | ICD-10-CM | POA: Diagnosis present

## 2018-05-17 DIAGNOSIS — M25561 Pain in right knee: Secondary | ICD-10-CM | POA: Diagnosis present

## 2018-05-17 DIAGNOSIS — Z96651 Presence of right artificial knee joint: Secondary | ICD-10-CM

## 2018-05-17 DIAGNOSIS — Z981 Arthrodesis status: Secondary | ICD-10-CM

## 2018-05-17 HISTORY — PX: TOTAL KNEE ARTHROPLASTY: SHX125

## 2018-05-17 LAB — POTASSIUM: Potassium: 5.3 mmol/L — ABNORMAL HIGH (ref 3.5–5.1)

## 2018-05-17 LAB — TYPE AND SCREEN
ABO/RH(D): A POS
Antibody Screen: NEGATIVE

## 2018-05-17 SURGERY — ARTHROPLASTY, KNEE, TOTAL
Anesthesia: Spinal | Site: Knee | Laterality: Right

## 2018-05-17 MED ORDER — OXYCODONE HCL 5 MG PO TABS
5.0000 mg | ORAL_TABLET | ORAL | Status: DC | PRN
  Administered 2018-05-17: 10 mg via ORAL
  Administered 2018-05-17: 5 mg via ORAL
  Administered 2018-05-17 – 2018-05-18 (×3): 10 mg via ORAL
  Filled 2018-05-17 (×4): qty 2

## 2018-05-17 MED ORDER — SODIUM CHLORIDE 0.9 % IV SOLN
INTRAVENOUS | Status: DC | PRN
  Administered 2018-05-17: 500 mL

## 2018-05-17 MED ORDER — ALUM & MAG HYDROXIDE-SIMETH 200-200-20 MG/5ML PO SUSP
30.0000 mL | ORAL | Status: DC | PRN
  Administered 2018-05-18: 30 mL via ORAL
  Filled 2018-05-17: qty 30

## 2018-05-17 MED ORDER — LOSARTAN POTASSIUM 50 MG PO TABS
50.0000 mg | ORAL_TABLET | Freq: Two times a day (BID) | ORAL | Status: DC
  Administered 2018-05-18 – 2018-05-19 (×3): 50 mg via ORAL
  Filled 2018-05-17 (×3): qty 1

## 2018-05-17 MED ORDER — RIVAROXABAN 10 MG PO TABS
10.0000 mg | ORAL_TABLET | Freq: Every day | ORAL | Status: DC
  Administered 2018-05-18 – 2018-05-19 (×2): 10 mg via ORAL
  Filled 2018-05-17 (×2): qty 1

## 2018-05-17 MED ORDER — SODIUM CHLORIDE 0.9 % IR SOLN
Status: DC | PRN
  Administered 2018-05-17: 1000 mL

## 2018-05-17 MED ORDER — ONDANSETRON HCL 4 MG PO TABS: 4.0000 mg | ORAL_TABLET | Freq: Four times a day (QID) | ORAL | Status: DC | PRN

## 2018-05-17 MED ORDER — FENTANYL CITRATE (PF) 100 MCG/2ML IJ SOLN
INTRAMUSCULAR | Status: DC | PRN
  Administered 2018-05-17 (×2): 25 ug via INTRAVENOUS
  Administered 2018-05-17: 50 ug via INTRAVENOUS

## 2018-05-17 MED ORDER — HYDROMORPHONE HCL 1 MG/ML IJ SOLN
0.2500 mg | INTRAMUSCULAR | Status: DC | PRN
Start: 2018-05-17 — End: 2018-05-17

## 2018-05-17 MED ORDER — MENTHOL 3 MG MT LOZG: 1.0000 | LOZENGE | OROMUCOSAL | Status: DC | PRN

## 2018-05-17 MED ORDER — CEFAZOLIN SODIUM-DEXTROSE 2-4 GM/100ML-% IV SOLN
2.0000 g | INTRAVENOUS | Status: AC
  Administered 2018-05-17: 2 g via INTRAVENOUS
  Filled 2018-05-17: qty 100

## 2018-05-17 MED ORDER — PROPOFOL 500 MG/50ML IV EMUL
INTRAVENOUS | Status: DC | PRN
  Administered 2018-05-17: 75 ug/kg/min via INTRAVENOUS

## 2018-05-17 MED ORDER — FAMOTIDINE 10 MG PO TABS
10.0000 mg | ORAL_TABLET | Freq: Every day | ORAL | Status: DC
  Administered 2018-05-18 – 2018-05-19 (×2): 10 mg via ORAL
  Filled 2018-05-17 (×2): qty 1

## 2018-05-17 MED ORDER — SODIUM CHLORIDE 0.9 % IJ SOLN
INTRAMUSCULAR | Status: AC
  Filled 2018-05-17: qty 20

## 2018-05-17 MED ORDER — METOCLOPRAMIDE HCL 5 MG PO TABS: 5.0000 mg | ORAL_TABLET | Freq: Three times a day (TID) | ORAL | Status: DC | PRN

## 2018-05-17 MED ORDER — MIDAZOLAM HCL 5 MG/5ML IJ SOLN
INTRAMUSCULAR | Status: DC | PRN
  Administered 2018-05-17: 2 mg via INTRAVENOUS

## 2018-05-17 MED ORDER — PHENOL 1.4 % MT LIQD: 1.0000 | OROMUCOSAL | Status: DC | PRN

## 2018-05-17 MED ORDER — GABAPENTIN 300 MG PO CAPS
300.0000 mg | ORAL_CAPSULE | Freq: Once | ORAL | Status: AC
  Administered 2018-05-17: 300 mg via ORAL
  Filled 2018-05-17: qty 1

## 2018-05-17 MED ORDER — HYDROMORPHONE HCL 1 MG/ML IJ SOLN
0.5000 mg | INTRAMUSCULAR | Status: DC | PRN
  Administered 2018-05-17: 1 mg via INTRAVENOUS
  Administered 2018-05-17: 0.5 mg via INTRAVENOUS
  Administered 2018-05-18: 1 mg via INTRAVENOUS
  Filled 2018-05-17 (×3): qty 1

## 2018-05-17 MED ORDER — SODIUM CHLORIDE 0.9 % IJ SOLN
INTRAMUSCULAR | Status: DC | PRN
  Administered 2018-05-17: 20 mL

## 2018-05-17 MED ORDER — FLEET ENEMA 7-19 GM/118ML RE ENEM: 1.0000 | ENEMA | Freq: Once | RECTAL | Status: DC | PRN

## 2018-05-17 MED ORDER — DEXAMETHASONE SODIUM PHOSPHATE 10 MG/ML IJ SOLN: 8.0000 mg | Freq: Once | INTRAMUSCULAR | Status: DC

## 2018-05-17 MED ORDER — METOCLOPRAMIDE HCL 5 MG/ML IJ SOLN: 5.0000 mg | Freq: Three times a day (TID) | INTRAMUSCULAR | Status: DC | PRN

## 2018-05-17 MED ORDER — POLYETHYLENE GLYCOL 3350 17 G PO PACK: 17.0000 g | PACK | Freq: Every day | ORAL | Status: DC | PRN

## 2018-05-17 MED ORDER — HYDROCHLOROTHIAZIDE 25 MG PO TABS
25.0000 mg | ORAL_TABLET | Freq: Every day | ORAL | Status: DC
  Administered 2018-05-18 – 2018-05-19 (×2): 25 mg via ORAL
  Filled 2018-05-17 (×2): qty 1

## 2018-05-17 MED ORDER — ACETAMINOPHEN 10 MG/ML IV SOLN
1000.0000 mg | Freq: Four times a day (QID) | INTRAVENOUS | Status: DC
  Administered 2018-05-17: 1000 mg via INTRAVENOUS
  Filled 2018-05-17 (×2): qty 100

## 2018-05-17 MED ORDER — LACTATED RINGERS IV SOLN
INTRAVENOUS | Status: DC
  Administered 2018-05-17 (×2): via INTRAVENOUS

## 2018-05-17 MED ORDER — DOCUSATE SODIUM 100 MG PO CAPS
100.0000 mg | ORAL_CAPSULE | Freq: Two times a day (BID) | ORAL | Status: DC
  Administered 2018-05-17 – 2018-05-19 (×4): 100 mg via ORAL
  Filled 2018-05-17 (×4): qty 1

## 2018-05-17 MED ORDER — ONDANSETRON HCL 4 MG/2ML IJ SOLN
INTRAMUSCULAR | Status: DC | PRN
  Administered 2018-05-17: 4 mg via INTRAVENOUS

## 2018-05-17 MED ORDER — ACETAMINOPHEN 10 MG/ML IV SOLN: 1000.0000 mg | Freq: Once | INTRAVENOUS | Status: DC | PRN

## 2018-05-17 MED ORDER — PROPOFOL 10 MG/ML IV BOLUS
INTRAVENOUS | Status: AC
  Filled 2018-05-17: qty 40

## 2018-05-17 MED ORDER — ROPIVACAINE HCL 7.5 MG/ML IJ SOLN
INTRAMUSCULAR | Status: DC | PRN
  Administered 2018-05-17 (×4): 5 mL via PERINEURAL

## 2018-05-17 MED ORDER — PROPOFOL 10 MG/ML IV BOLUS
INTRAVENOUS | Status: DC | PRN
  Administered 2018-05-17: 10 mg via INTRAVENOUS
  Administered 2018-05-17: 15 mg via INTRAVENOUS

## 2018-05-17 MED ORDER — CEFAZOLIN SODIUM-DEXTROSE 1-4 GM/50ML-% IV SOLN
1.0000 g | Freq: Four times a day (QID) | INTRAVENOUS | Status: AC
  Administered 2018-05-17 (×2): 1 g via INTRAVENOUS
  Filled 2018-05-17 (×2): qty 50

## 2018-05-17 MED ORDER — BUPIVACAINE IN DEXTROSE 0.75-8.25 % IT SOLN
INTRATHECAL | Status: DC | PRN
  Administered 2018-05-17: 1.6 mL via INTRATHECAL

## 2018-05-17 MED ORDER — SODIUM CHLORIDE 0.9 % IV SOLN
INTRAVENOUS | Status: AC
  Filled 2018-05-17: qty 500000

## 2018-05-17 MED ORDER — DEXAMETHASONE SODIUM PHOSPHATE 10 MG/ML IJ SOLN
INTRAMUSCULAR | Status: AC
  Filled 2018-05-17: qty 3

## 2018-05-17 MED ORDER — SODIUM CHLORIDE 0.9 % IV SOLN
INTRAVENOUS | Status: DC | PRN
  Administered 2018-05-17: 15 ug/min via INTRAVENOUS

## 2018-05-17 MED ORDER — BUPIVACAINE LIPOSOME 1.3 % IJ SUSP
INTRAMUSCULAR | Status: DC | PRN
  Administered 2018-05-17: 20 mL

## 2018-05-17 MED ORDER — ROPIVACAINE HCL 5 MG/ML IJ SOLN
INTRAMUSCULAR | Status: DC | PRN
  Administered 2018-05-17 (×2): 5 mL via PERINEURAL

## 2018-05-17 MED ORDER — BISACODYL 5 MG PO TBEC: 5.0000 mg | DELAYED_RELEASE_TABLET | Freq: Every day | ORAL | Status: DC | PRN

## 2018-05-17 MED ORDER — CHLORHEXIDINE GLUCONATE 4 % EX LIQD: 60.0000 mL | Freq: Once | CUTANEOUS | Status: DC

## 2018-05-17 MED ORDER — MEPERIDINE HCL 50 MG/ML IJ SOLN
6.2500 mg | INTRAMUSCULAR | Status: DC | PRN
Start: 2018-05-17 — End: 2018-05-17

## 2018-05-17 MED ORDER — FERROUS SULFATE 325 (65 FE) MG PO TABS
325.0000 mg | ORAL_TABLET | Freq: Three times a day (TID) | ORAL | Status: DC
  Administered 2018-05-18 – 2018-05-19 (×4): 325 mg via ORAL
  Filled 2018-05-17 (×4): qty 1

## 2018-05-17 MED ORDER — PROPOFOL 10 MG/ML IV BOLUS
INTRAVENOUS | Status: AC
  Filled 2018-05-17: qty 60

## 2018-05-17 MED ORDER — LACTATED RINGERS IV SOLN
INTRAVENOUS | Status: DC
  Administered 2018-05-17 – 2018-05-18 (×2): via INTRAVENOUS

## 2018-05-17 MED ORDER — FENTANYL CITRATE (PF) 100 MCG/2ML IJ SOLN
INTRAMUSCULAR | Status: AC
Start: 2018-05-17 — End: ?
  Filled 2018-05-17: qty 2

## 2018-05-17 MED ORDER — ESTRADIOL 0.05 MG/24HR TD PTWK
0.0500 mg | MEDICATED_PATCH | TRANSDERMAL | Status: DC
  Filled 2018-05-17: qty 1

## 2018-05-17 MED ORDER — ACETAMINOPHEN 500 MG PO TABS
1000.0000 mg | ORAL_TABLET | Freq: Four times a day (QID) | ORAL | Status: AC
  Administered 2018-05-17 – 2018-05-18 (×4): 1000 mg via ORAL
  Filled 2018-05-17 (×4): qty 2

## 2018-05-17 MED ORDER — DEXAMETHASONE SODIUM PHOSPHATE 10 MG/ML IJ SOLN
INTRAMUSCULAR | Status: DC | PRN
  Administered 2018-05-17: 10 mg via INTRAVENOUS

## 2018-05-17 MED ORDER — OXYCODONE HCL 5 MG PO TABS
ORAL_TABLET | ORAL | Status: AC
  Filled 2018-05-17: qty 1

## 2018-05-17 MED ORDER — HYDROMORPHONE HCL 1 MG/ML IJ SOLN
1.0000 mg | Freq: Once | INTRAMUSCULAR | Status: AC
  Administered 2018-05-17: 1 mg via INTRAVENOUS
  Filled 2018-05-17: qty 1

## 2018-05-17 MED ORDER — MIDAZOLAM HCL 2 MG/2ML IJ SOLN
INTRAMUSCULAR | Status: AC
  Filled 2018-05-17: qty 2

## 2018-05-17 MED ORDER — PROMETHAZINE HCL 25 MG/ML IJ SOLN: 6.2500 mg | INTRAMUSCULAR | Status: DC | PRN

## 2018-05-17 MED ORDER — LOSARTAN POTASSIUM 50 MG PO TABS: 50.0000 mg | ORAL_TABLET | Freq: Two times a day (BID) | ORAL | Status: DC

## 2018-05-17 MED ORDER — ACETAMINOPHEN 325 MG PO TABS
325.0000 mg | ORAL_TABLET | Freq: Four times a day (QID) | ORAL | Status: DC | PRN
  Administered 2018-05-18: 650 mg via ORAL
  Filled 2018-05-17: qty 2

## 2018-05-17 MED ORDER — ONDANSETRON HCL 4 MG/2ML IJ SOLN
INTRAMUSCULAR | Status: AC
  Filled 2018-05-17: qty 6

## 2018-05-17 MED ORDER — ONDANSETRON HCL 4 MG/2ML IJ SOLN: 4.0000 mg | Freq: Four times a day (QID) | INTRAMUSCULAR | Status: DC | PRN

## 2018-05-17 MED ORDER — FLUTICASONE PROPIONATE 50 MCG/ACT NA SUSP: 2.0000 | Freq: Two times a day (BID) | NASAL | Status: DC

## 2018-05-17 SURGICAL SUPPLY — 71 items
AGENT HMST SPONGE THK3/8 (HEMOSTASIS) ×1
BAG DECANTER FOR FLEXI CONT (MISCELLANEOUS) ×3 IMPLANT
BAG SPEC THK2 15X12 ZIP CLS (MISCELLANEOUS) ×1
BAG ZIPLOCK 12X15 (MISCELLANEOUS) ×2 IMPLANT
BANDAGE ACE 4X5 VEL STRL LF (GAUZE/BANDAGES/DRESSINGS) ×3 IMPLANT
BANDAGE ACE 6X5 VEL STRL LF (GAUZE/BANDAGES/DRESSINGS) ×3 IMPLANT
BLADE SAG 18X100X1.27 (BLADE) ×3 IMPLANT
BLADE SAW SGTL 11.0X1.19X90.0M (BLADE) ×3 IMPLANT
BNDG GAUZE ELAST 4 BULKY (GAUZE/BANDAGES/DRESSINGS) ×6 IMPLANT
CAP KNEE TOTAL 3 SIGMA ×2 IMPLANT
CEMENT HV SMART SET (Cement) ×6 IMPLANT
CLOSURE WOUND 1/2 X4 (GAUZE/BANDAGES/DRESSINGS) ×2
COVER SURGICAL LIGHT HANDLE (MISCELLANEOUS) ×3 IMPLANT
CUFF TOURN SGL QUICK 34 (TOURNIQUET CUFF) ×3
CUFF TRNQT CYL 34X4X40X1 (TOURNIQUET CUFF) ×1 IMPLANT
DECANTER SPIKE VIAL GLASS SM (MISCELLANEOUS) ×5 IMPLANT
DRAPE C-ARM 42X120 X-RAY (DRAPES) ×2 IMPLANT
DRAPE C-ARMOR (DRAPES) ×2 IMPLANT
DRAPE INCISE IOBAN 66X45 STRL (DRAPES) IMPLANT
DRAPE U-SHAPE 47X51 STRL (DRAPES) ×3 IMPLANT
DRSG ADAPTIC 3X8 NADH LF (GAUZE/BANDAGES/DRESSINGS) ×3 IMPLANT
DRSG PAD ABDOMINAL 8X10 ST (GAUZE/BANDAGES/DRESSINGS) ×2 IMPLANT
DURAPREP 26ML APPLICATOR (WOUND CARE) ×3 IMPLANT
ELECT BLADE TIP CTD 4 INCH (ELECTRODE) ×3 IMPLANT
ELECT REM PT RETURN 15FT ADLT (MISCELLANEOUS) ×3 IMPLANT
EVACUATOR 1/8 PVC DRAIN (DRAIN) ×3 IMPLANT
FACESHIELD WRAPAROUND (MASK) ×3 IMPLANT
FACESHIELD WRAPAROUND OR TEAM (MASK) ×1 IMPLANT
GAUZE SPONGE 4X4 12PLY STRL (GAUZE/BANDAGES/DRESSINGS) ×3 IMPLANT
GLOVE BIOGEL PI IND STRL 6.5 (GLOVE) ×1 IMPLANT
GLOVE BIOGEL PI IND STRL 8.5 (GLOVE) ×1 IMPLANT
GLOVE BIOGEL PI INDICATOR 6.5 (GLOVE) ×2
GLOVE BIOGEL PI INDICATOR 8.5 (GLOVE) ×2
GLOVE ECLIPSE 8.0 STRL XLNG CF (GLOVE) ×6 IMPLANT
GLOVE SURG SS PI 6.5 STRL IVOR (GLOVE) ×3 IMPLANT
GOWN STRL REUS W/TWL LRG LVL3 (GOWN DISPOSABLE) ×3 IMPLANT
GOWN STRL REUS W/TWL XL LVL3 (GOWN DISPOSABLE) ×3 IMPLANT
HANDPIECE INTERPULSE COAX TIP (DISPOSABLE) ×3
HEMOSTAT SPONGE AVITENE ULTRA (HEMOSTASIS) ×3 IMPLANT
HOLDER FOLEY CATH W/STRAP (MISCELLANEOUS) ×2 IMPLANT
IMMOBILIZER KNEE 20 (SOFTGOODS) ×3
IMMOBILIZER KNEE 20 THIGH 36 (SOFTGOODS) ×1 IMPLANT
MANIFOLD NEPTUNE II (INSTRUMENTS) ×3 IMPLANT
NDL SAFETY ECLIPSE 18X1.5 (NEEDLE) ×1 IMPLANT
NEEDLE HYPO 18GX1.5 SHARP (NEEDLE) ×3
NS IRRIG 1000ML POUR BTL (IV SOLUTION) IMPLANT
PACK TOTAL KNEE CUSTOM (KITS) ×3 IMPLANT
PAD ABD 8X10 STRL (GAUZE/BANDAGES/DRESSINGS) ×4 IMPLANT
PADDING CAST COTTON 6X4 STRL (CAST SUPPLIES) ×3 IMPLANT
PENCIL SMOKE EVAC W/HOLSTER (ELECTROSURGICAL) ×3 IMPLANT
POSITIONER SURGICAL ARM (MISCELLANEOUS) ×3 IMPLANT
SET HNDPC FAN SPRY TIP SCT (DISPOSABLE) ×1 IMPLANT
SET PAD KNEE POSITIONER (MISCELLANEOUS) ×3 IMPLANT
SPONGE LAP 18X18 RF (DISPOSABLE) IMPLANT
STRIP CLOSURE SKIN 1/2X4 (GAUZE/BANDAGES/DRESSINGS) ×4 IMPLANT
SUT BONE WAX W31G (SUTURE) IMPLANT
SUT MNCRL AB 4-0 PS2 18 (SUTURE) ×3 IMPLANT
SUT STRATAFIX 0 PDS 27 VIOLET (SUTURE) ×3
SUT VIC AB 1 CT1 27 (SUTURE) ×3
SUT VIC AB 1 CT1 27XBRD ANTBC (SUTURE) ×1 IMPLANT
SUT VIC AB 2-0 CT1 27 (SUTURE) ×9
SUT VIC AB 2-0 CT1 TAPERPNT 27 (SUTURE) ×3 IMPLANT
SUTURE STRATFX 0 PDS 27 VIOLET (SUTURE) ×1 IMPLANT
SYR 20CC LL (SYRINGE) ×6 IMPLANT
SYR 3ML LL SCALE MARK (SYRINGE) IMPLANT
TOWER CARTRIDGE SMART MIX (DISPOSABLE) ×3 IMPLANT
TRAY FOLEY CATH 14FR (SET/KITS/TRAYS/PACK) ×2 IMPLANT
TRAY FOLEY MTR SLVR 16FR STAT (SET/KITS/TRAYS/PACK) ×1 IMPLANT
WATER STERILE IRR 1000ML POUR (IV SOLUTION) ×5 IMPLANT
WRAP KNEE MAXI GEL POST OP (GAUZE/BANDAGES/DRESSINGS) ×3 IMPLANT
YANKAUER SUCT BULB TIP 10FT TU (MISCELLANEOUS) ×3 IMPLANT

## 2018-05-17 NOTE — Evaluation (Signed)
Physical Therapy Evaluation Patient Details Name: Colleen Larsen MRN: [id number] DOB: 1966/04/17 Today's Date: 05/17/2018   History of Present Illness  Pt s/p R TKR with insertion of bone graft in femoral canal  Clinical Impression  Pt s/p R TKR and presents with decreased R LE strength/ROM, post op pain and TDWB on R LE limiting functional mobility.  Pt should progress to dc home with family assist.    Follow Up Recommendations Home health PT    Equipment Recommendations  Rolling walker with 5" wheels    Recommendations for Other Services       Precautions / Restrictions Precautions Precautions: Fall Restrictions Weight Bearing Restrictions: Yes RLE Weight Bearing: Touchdown weight bearing Other Position/Activity Restrictions: (Per verbal order Dr Darrelyn Hillock)      Mobility  Bed Mobility Overal bed mobility: Needs Assistance Bed Mobility: Supine to Sit     Supine to sit: Min assist     General bed mobility comments: cues for sequence and use of L LE to self assist  Transfers Overall transfer level: Needs assistance Equipment used: Rolling walker (2 wheeled) Transfers: Sit to/from Stand Sit to Stand: Min assist         General transfer comment: cues for LE management and use of UEs to self assist  Ambulation/Gait Ambulation/Gait assistance: Min assist Gait Distance (Feet): 20 Feet Assistive device: Rolling walker (2 wheeled) Gait Pattern/deviations: Step-to pattern;Decreased step length - right;Decreased step length - left;Shuffle;Trunk flexed Gait velocity: decr   General Gait Details: cues for sequence, posture, position from RW and TDWB on R LE  Stairs            Wheelchair Mobility    Modified Rankin (Stroke Patients Only)       Balance                                             Pertinent Vitals/Pain Pain Assessment: 0-10 Pain Score: 6  Pain Location: R knee Pain Descriptors / Indicators: Aching;Sore Pain  Intervention(s): Limited activity within patient's tolerance;Monitored during session;Premedicated before session;Patient requesting pain meds-RN notified;Ice applied    Home Living Family/patient expects to be discharged to:: Private residence Living Arrangements: Spouse/significant other Available Help at Discharge: Family Type of Home: House Home Access: Stairs to enter Entrance Stairs-Rails: Right Entrance Stairs-Number of Steps: 3 Home Layout: One level Home Equipment: None      Prior Function Level of Independence: Independent               Hand Dominance        Extremity/Trunk Assessment   Upper Extremity Assessment Upper Extremity Assessment: Overall WFL for tasks assessed    Lower Extremity Assessment Lower Extremity Assessment: RLE deficits/detail    Cervical / Trunk Assessment Cervical / Trunk Assessment: Normal  Communication   Communication: No difficulties  Cognition Arousal/Alertness: Awake/alert Behavior During Therapy: WFL for tasks assessed/performed Overall Cognitive Status: Within Functional Limits for tasks assessed                                        General Comments      Exercises Total Joint Exercises Ankle Circles/Pumps: AROM;Both;15 reps;Supine   Assessment/Plan    PT Assessment Patient needs continued PT services  PT Problem List Decreased strength;Decreased range of motion;Decreased  activity tolerance;Decreased mobility;Decreased knowledge of use of DME;Pain       PT Treatment Interventions DME instruction;Gait training;Stair training;Functional mobility training;Therapeutic activities;Therapeutic exercise;Patient/family education    PT Goals (Current goals can be found in the Care Plan section)  Acute Rehab PT Goals Patient Stated Goal: Regain IND PT Goal Formulation: With patient Time For Goal Achievement: 05/24/18 Potential to Achieve Goals: Good    Frequency 7X/week   Barriers to discharge         Co-evaluation               AM-PAC PT "6 Clicks" Daily Activity  Outcome Measure Difficulty turning over in bed (including adjusting bedclothes, sheets and blankets)?: Unable Difficulty moving from lying on back to sitting on the side of the bed? : Unable Difficulty sitting down on and standing up from a chair with arms (e.g., wheelchair, bedside commode, etc,.)?: Unable Help needed moving to and from a bed to chair (including a wheelchair)?: A Little Help needed walking in hospital room?: A Little Help needed climbing 3-5 steps with a railing? : A Lot 6 Click Score: 11    End of Session Equipment Utilized During Treatment: Gait belt;Right knee immobilizer Activity Tolerance: Patient tolerated treatment well Patient left: in bed Nurse Communication: Mobility status PT Visit Diagnosis: Difficulty in walking, not elsewhere classified (R26.2)    Time: [id number] PT Time Calculation (min) (ACUTE ONLY): 28 min   Charges:   PT Evaluation $PT Eval Low Complexity: 1 Low PT Treatments $Gait Training: 8-22 mins   PT G Codes:        Pg [phone]   Edris Friedt 05/17/2018, 5:40 PM

## 2018-05-17 NOTE — Discharge Instructions (Addendum)
INSTRUCTIONS AFTER JOINT REPLACEMENT   o Remove items at home which could result in a fall. This includes throw rugs or furniture in walking pathways o ICE to the affected joint every three hours while awake for 30 minutes at a time, for at least the first 3-5 days, and then as needed for pain and swelling.  Continue to use ice for pain and swelling. You may notice swelling that will progress down to the foot and ankle.  This is normal after surgery.  Elevate your leg when you are not up walking on it.   o Continue to use the breathing machine you got in the hospital (incentive spirometer) which will help keep your temperature down.  It is common for your temperature to cycle up and down following surgery, especially at night when you are not up moving around and exerting yourself.  The breathing machine keeps your lungs expanded and your temperature down.   DIET:  As you were doing prior to hospitalization, we recommend a well-balanced diet.  DRESSING / WOUND CARE / SHOWERING  You may change your dressing every day with sterile gauze.  Please use good hand washing techniques before changing the dressing.  Do not use any lotions or creams on the incision until instructed by your surgeon.  ACTIVITY  o Increase activity slowly as tolerated, but follow the weight bearing instructions below.   o No driving for 6 weeks or until further direction given by your physician.  You cannot drive while taking narcotics.  o No lifting or carrying greater than 10 lbs. until further directed by your surgeon. o Avoid periods of inactivity such as sitting longer than an hour when not asleep. This helps prevent blood clots.  o You may return to work once you are authorized by your doctor.     WEIGHT BEARING   Partial weight bearing with assist device as directed.  Touch down weightbearing with walker   EXERCISES  Results after joint replacement surgery are often greatly improved when you follow the  exercise, range of motion and muscle strengthening exercises prescribed by your doctor. Safety measures are also important to protect the joint from further injury. Any time any of these exercises cause you to have increased pain or swelling, decrease what you are doing until you are comfortable again and then slowly increase them. If you have problems or questions, call your caregiver or physical therapist for advice.   Rehabilitation is important following a joint replacement. After just a few days of immobilization, the muscles of the leg can become weakened and shrink (atrophy).  These exercises are designed to build up the tone and strength of the thigh and leg muscles and to improve motion. Often times heat used for twenty to thirty minutes before working out will loosen up your tissues and help with improving the range of motion but do not use heat for the first two weeks following surgery (sometimes heat can increase post-operative swelling).   These exercises can be done on a training (exercise) mat, on the floor, on a table or on a bed. Use whatever works the best and is most comfortable for you.    Use music or television while you are exercising so that the exercises are a pleasant break in your day. This will make your life better with the exercises acting as a break in your routine that you can look forward to.   Perform all exercises about fifteen times, three times per day or as  directed.  You should exercise both the operative leg and the other leg as well.  Exercises include:    Quad Sets - Tighten up the muscle on the front of the thigh (Quad) and hold for 5-10 seconds.    Straight Leg Raises - With your knee straight (if you were given a brace, keep it on), lift the leg to 60 degrees, hold for 3 seconds, and slowly lower the leg.  Perform this exercise against resistance later as your leg gets stronger.   Leg Slides: Lying on your back, slowly slide your foot toward your buttocks,  bending your knee up off the floor (only go as far as is comfortable). Then slowly slide your foot back down until your leg is flat on the floor again.   Angel Wings: Lying on your back spread your legs to the side as far apart as you can without causing discomfort.   Hamstring Strength:  Lying on your back, push your heel against the floor with your leg straight by tightening up the muscles of your buttocks.  Repeat, but this time bend your knee to a comfortable angle, and push your heel against the floor.  You may put a pillow under the heel to make it more comfortable if necessary.   A rehabilitation program following joint replacement surgery can speed recovery and prevent re-injury in the future due to weakened muscles. Contact your doctor or a physical therapist for more information on knee rehabilitation.    CONSTIPATION  Constipation is defined medically as fewer than three stools per week and severe constipation as less than one stool per week.  Even if you have a regular bowel pattern at home, your normal regimen is likely to be disrupted due to multiple reasons following surgery.  Combination of anesthesia, postoperative narcotics, change in appetite and fluid intake all can affect your bowels.   YOU MUST use at least one of the following options; they are listed in order of increasing strength to get the job done.  They are all available over the counter, and you may need to use some, POSSIBLY even all of these options:    Drink plenty of fluids (prune juice may be helpful) and high fiber foods Colace 100 mg by mouth twice a day  Senokot for constipation as directed and as needed Dulcolax (bisacodyl), take with full glass of water  Miralax (polyethylene glycol) once or twice a day as needed.  If you have tried all these things and are unable to have a bowel movement in the first 3-4 days after surgery call either your surgeon or your primary doctor.    If you experience loose stools  or diarrhea, hold the medications until you stool forms back up.  If your symptoms do not get better within 1 week or if they get worse, check with your doctor.  If you experience "the worst abdominal pain ever" or develop nausea or vomiting, please contact the office immediately for further recommendations for treatment.   ITCHING:  If you experience itching with your medications, try taking only a single pain pill, or even half a pain pill at a time.  You can also use Benadryl over the counter for itching or also to help with sleep.   TED HOSE STOCKINGS:  Use stockings on both legs until for at least 2 weeks or as directed by physician office. They may be removed at night for sleeping.  MEDICATIONS:  See your medication summary on the After Visit  Summary that nursing will review with you.  You may have some home medications which will be placed on hold until you complete the course of blood thinner medication.  It is important for you to complete the blood thinner medication as prescribed.  PRECAUTIONS:  If you experience chest pain or shortness of breath - call 911 immediately for transfer to the hospital emergency department.   If you develop a fever greater that 101 F, purulent drainage from wound, increased redness or drainage from wound, foul odor from the wound/dressing, or calf pain - CONTACT YOUR SURGEON.                                                   FOLLOW-UP APPOINTMENTS:  If you do not already have a post-op appointment, please call the office for an appointment to be seen by your surgeon.  Guidelines for how soon to be seen are listed in your After Visit Summary, but are typically between 1-4 weeks after surgery.  OTHER INSTRUCTIONS:   Knee Replacement:  Do not place pillow under knee, focus on keeping the knee straight while resting. CPM instructions: 0-90 degrees, 2 hours in the morning, 2 hours in the afternoon, and 2 hours in the evening. Place foam block, curve side up  under heel at all times except when in CPM or when walking.  DO NOT modify, tear, cut, or change the foam block in any way.  MAKE SURE YOU:   Understand these instructions.   Get help right away if you are not doing well or get worse.    Thank you for letting us be a part of your medical care team.  It is a privilege we respect greatly.  We hope these instructions will help you stay on track for a fast and full recovery!

## 2018-05-17 NOTE — Op Note (Signed)
NAME: Colleen Larsen, Colleen B. MEDICAL RECORD [id number] ACCOUNT [id number] DATE OF BIRTH:Dec 31, 1965 FACILITY: [facility] LOCATION: [facility] PHYSICIAN:Saumya Hukill Sanjuana KavaA. Darlys Buis, MD  OPERATIVE REPORT  DATE OF PROCEDURE:  05/17/2018  SURGEON:  Dr. Windy Fastonald A. Compton Brigance.  ASSISTANT:  Dimitri PedAmber Constable PA.  PREOPERATIVE DIAGNOSIS:  Primary osteoarthritis with bone on bone.  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  PROCEDURE:  Right total knee arthroplasty utilizing the DePuy system.    All 3 components were cemented.  The sizes used was a size 3 right femoral component posterior cruciate sacrificing type.  The tibial tray was a size 3.  The insert was a size 3, 10 mm thickness insert rotating platform polyethylene type.  The patella  was a size 35 with 3 pegs.  DESCRIPTION OF PROCEDURE: 1.  A sterile prep and draping of the right lower extremity was carried out.  The appropriate time-out was carried out also marked the appropriate right leg in the holding area.  At this time, after the prep and draping, the leg was exsanguinated with  Esmarch tourniquet was elevated at 325 mmHg.  The patient had 2 grams of IV Ancef.  At that particular time, the right lower extremity was placed in the [facility].  An anterior approach to knee was carried out.  Two flaps were created.  I then  carried out a median parapatellar incision.  We then reflected the patella laterally.  We did medial and lateral meniscectomies and excised the anterior and posterior cruciate ligaments.  At this particular time, the initial drill hole was made in the  intercondylar notch.  The bone was extremely hard.  We brought the C-arm in just to check and make sure the canal finder was going appropriate direction.  There was a small penetration medially.  We then redrilled more laterally.  The canal finder, then  was inserted under C-arm control and looked perfectly fine.  At this time the #1 jig was inserted.  We  removal 11 mm thickness off the distal femur at that particular time.  We then measured the femur to be a size 3 right.  Following that, we then  directed attention to the tibial plateau.  The appropriate guide was inserted.  We removed 4 mm thickness off of the medial side as our guide.  At this particular point, the lamina spreader was inserted.  There were no posterior spurs.  We had a really  nice smooth cut of the distal femur.  We then completed our meniscectomies.  At this particular point, we then inserted our spacer block and elected to use a 10 mm thickness insert.  Following that, we then went on to complete the preparation of the  tibia.  A keel was made in the tibial plateau in the usual fashion.  At this particular point, we then prepared the femur.  We did the notch cut of the distal femur in the usual fashion.  After this, we thoroughly irrigated out the area and we inserted  our trial components.  We then did a resurfacing procedure on the patella in the usual fashion.  Three drill holes were made in the patella.  The patella was measured to be a size 35.  All trial components were removed.  We then did check thoroughly for  our stability with a 10 mm thickness.  We felt we had good flexion, extension, good stability of the medial and lateral plane.  After we thoroughly irrigated out the area.  I then  utilized some of the bone and removed from our bone cuts and inserted it  into the femoral canal in order to block that small penetration medially.  At this particular point, we then cemented all 3 components in simultaneously.  After the cement was hardened, we removed all loose pieces of cement.  In order to make sure we had  all small pieces of cement removed.  We then washed thoroughly water picked out the knee again several times.  We then tested the knee again with the 10 mm thickness insert and felt that we had good position, good stability.  So we finally inserted our  permanent  rotating platform and reduced the knee and had excellent function.  Once again, the knee was irrigated.  I loosely applied some Gelfoam posteriorly.  At this point, after checking the patella to make sure the patella was fixed thoroughly.  We  reduced the knee as I stated.  I then inserted a Hemovac drain after I irrigated some more with the antibiotic solution.  The remaining part of the wound was closed in the usual fashion.  Subcu was closed with subcuticular closure.  Sterile dressings  were applied.  AN/NUANCE  D:05/17/2018 T:05/17/2018 JOB:[id number]

## 2018-05-17 NOTE — Anesthesia Postprocedure Evaluation (Signed)
Anesthesia Post Note  Patient: Colleen MasonBarbara B Larsen  Procedure(s) Performed: RIGHT TOTAL KNEE ARTHROPLASTY, INSERTION OF BONE GRAFT IN FEMORAL CANAL (Right Knee)     Patient location during evaluation: PACU Anesthesia Type: Spinal Level of consciousness: sedated Pain management: pain level controlled Vital Signs Assessment: post-procedure vital signs reviewed and stable Respiratory status: spontaneous breathing Cardiovascular status: stable Postop Assessment: no headache, no backache, spinal receding, patient able to bend at knees and no apparent nausea or vomiting Anesthetic complications: no    Last Vitals:  Vitals:   05/17/18 1145 05/17/18 1200  BP: (!) 132/92 111/77  Pulse: 73 71  Resp: 17 13  Temp:    SpO2: 99% 100%    Last Pain:  Vitals:   05/17/18 1200  PainSc: Asleep   Pain Goal: Patients Stated Pain Goal: 3 (05/17/18 0703)  LLE Motor Response: Purposeful movement;Responds to commands (05/17/18 1200) LLE Sensation: Numbness (05/17/18 1200) RLE Motor Response: Purposeful movement;Responds to commands (05/17/18 1200) RLE Sensation: Numbness (05/17/18 1200) L Sensory Level: S1-Sole of foot, small toes (05/17/18 1200) R Sensory Level: S1-Sole of foot, small toes (05/17/18 1200)  Walther Sanagustin JR,JOHN Randilyn Foisy

## 2018-05-17 NOTE — Brief Op Note (Signed)
05/17/2018  10:46 AM  PATIENT:  Colleen Larsen  52 y.o. female  PRE-OPERATIVE DIAGNOSIS:  right knee Primary osteoarthritis  POST-OPERATIVE DIAGNOSIS:  right knee Primary  osteoarthritis  PROCEDURE:  Procedure(s): RIGHT TOTAL KNEE ARTHROPLASTY, INSERTION OF BONE GRAFT IN FEMORAL CANAL (Right)  SURGEON:  Surgeon(s) and Role:    Ranee Gosselin* Derius Ghosh, MD - Primary  PHYSICIAN ASSISTANT:Amber Saxononstable PA   ASSISTANTS: Dimitri PedAmber Constable PA  ANESTHESIA:   spinal and an Optician, dispensingAdductor Canal Block.  EBL:  25 mL   BLOOD ADMINISTERED:none  DRAINS: (One) Hemovact drain(s) in the Right Knee with  Suction Open   LOCAL MEDICATIONS USED:  OTHER 20cc of Exparel mixed with 20cc of Normal Saline.  SPECIMEN:  No Specimen  DISPOSITION OF SPECIMEN:  N/A  COUNTS:  YES  TOURNIQUET:   Total Tourniquet Time Documented: Thigh (Right) - 103 minutes Total: Thigh (Right) - 103 minutes   DICTATION: .Other Dictation: Dictation Number [id number]  PLAN OF CARE: Admit to inpatient   PATIENT DISPOSITION:  PACU - hemodynamically stable.   Delay start of Pharmacological VTE agent (>24hrs) due to surgical blood loss or risk of bleeding: yes

## 2018-05-17 NOTE — Progress Notes (Signed)
Subjective: Day of Surgery Procedure(s) (LRB): RIGHT TOTAL KNEE ARTHROPLASTY, INSERTION OF BONE GRAFT IN FEMORAL CANAL (Right) Patient reports pain as 4 on 0-10 scale. She has excellent function in her Right Foot. Her foot is warm and she has an excellent PosteriorTibial Pulse.I did a doppler on both pulses and they are intact.   Objective: Vital signs in last 24 hours: Temp:  [97.7 F (36.5 C)-98.2 F (36.8 C)] 98.2 F (36.8 C) (07/17 1459) Pulse Rate:  [68-84] 73 (07/17 1459) Resp:  [13-18] 17 (07/17 1459) BP: (100-132)/(50-92) 119/72 (07/17 1459) SpO2:  [96 %-100 %] 100 % (07/17 1459) Weight:  [82.6 kg (182 lb)] 82.6 kg (182 lb) (07/17 0703)  Intake/Output from previous day: No intake/output data recorded. Intake/Output this shift: Total I/O In: 1500 [I.V.:1500] Out: 1135 [Urine:1100; Drains:10; Blood:25]  No results for input(s): HGB in the last 72 hours. No results for input(s): WBC, RBC, HCT, PLT in the last 72 hours. Recent Labs    05/17/18 0637  K 5.3*   No results for input(s): LABPT, INR in the last 72 hours.  Neurologically intact Intact pulses distally Dorsiflexion/Plantar flexion intact  Anticipated LOS equal to or greater than 2 midnights due to - Age 54 and older with one or more of the following:  - Obesity  - Expected need for hospital services (PT, OT, Nursing) required for safe  discharge  - Anticipated need for postoperative skilled nursing care or inpatient rehab  - Active co-morbidities: Chronic pain requiring opiods OR   - Unanticipated findings during/Post Surgery: None  - Patient is a high risk of re-admission due to: None   Assessment/Plan: Day of Surgery Procedure(s) (LRB): RIGHT TOTAL KNEE ARTHROPLASTY, INSERTION OF BONE GRAFT IN FEMORAL CANAL (Right) Up with therapy    Ranee Gosselinonald Takeira Yanes 05/17/2018, 4:42 PM

## 2018-05-17 NOTE — Transfer of Care (Signed)
Immediate Anesthesia Transfer of Care Note  Patient: Colleen Larsen  Procedure(s) Performed: Procedure(s): RIGHT TOTAL KNEE ARTHROPLASTY, INSERTION OF BONE GRAFT IN FEMORAL CANAL (Right)  Patient Location: PACU  Anesthesia Type:Spinal  Level of Consciousness:  sedated, patient cooperative and responds to stimulation  Airway & Oxygen Therapy:Patient Spontanous Breathing and Patient connected to face mask oxgen  Post-op Assessment:  Report given to PACU RN and Post -op Vital signs reviewed and stable  Post vital signs:  Reviewed and stable  Last Vitals:  Vitals:   05/17/18 1110  BP: 113/74  Pulse: 84  Resp: 13  SpO2: 98%    Complications: No apparent anesthesia complications

## 2018-05-17 NOTE — Anesthesia Preprocedure Evaluation (Signed)
Anesthesia Evaluation  Patient identified by MRN, date of birth, ID band Patient awake    Reviewed: Allergy & Precautions, NPO status , Patient's Chart, lab work & pertinent test results  Airway Mallampati: I       Dental no notable dental hx. (+) Teeth Intact   Pulmonary Current Smoker,    Pulmonary exam normal breath sounds clear to auscultation       Cardiovascular hypertension, Pt. on medications Normal cardiovascular exam Rhythm:Regular Rate:Normal     Neuro/Psych    GI/Hepatic Neg liver ROS,   Endo/Other  negative endocrine ROS  Renal/GU negative Renal ROS  negative genitourinary   Musculoskeletal  (+) Arthritis , Osteoarthritis,    Abdominal Normal abdominal exam  (+)   Peds  Hematology negative hematology ROS (+)   Anesthesia Other Findings   Reproductive/Obstetrics                             Anesthesia Physical Anesthesia Plan  ASA: II  Anesthesia Plan: Spinal   Post-op Pain Management:  Regional for Post-op pain   Induction:   PONV Risk Score and Plan: 1 and Ondansetron  Airway Management Planned: Natural Airway, Nasal Cannula and Simple Face Mask  Additional Equipment:   Intra-op Plan:   Post-operative Plan:   Informed Consent: I have reviewed the patients History and Physical, chart, labs and discussed the procedure including the risks, benefits and alternatives for the proposed anesthesia with the patient or authorized representative who has indicated his/her understanding and acceptance.     Plan Discussed with: CRNA and Surgeon  Anesthesia Plan Comments:         Anesthesia Quick Evaluation

## 2018-05-17 NOTE — Anesthesia Procedure Notes (Signed)
Anesthesia Regional Block: Adductor canal block   Pre-Anesthetic Checklist: ,, timeout performed, Correct Patient, Correct Site, Correct Laterality, Correct Procedure, Correct Position, site marked, Risks and benefits discussed,  Surgical consent,  Pre-op evaluation,  At surgeon's request and post-op pain management  Laterality: Right and Lower  Prep: chloraprep       Needles:  Injection technique: Single-shot  Needle Type: Echogenic Stimulator Needle     Needle Length: 10cm  Needle Gauge: 21   Needle insertion depth: 4 cm   Additional Needles:   Procedures:,,,, ultrasound used (permanent image in chart),,,,  Narrative:  Start time: 05/17/2018 8:10 AM End time: 05/17/2018 8:23 AM Injection made incrementally with aspirations every 5 mL.  Performed by: Personally  Anesthesiologist: Leilani AbleHatchett, Dequann Vandervelden, MD

## 2018-05-17 NOTE — Anesthesia Procedure Notes (Signed)
Spinal  Start time: 05/17/2018 8:34 AM End time: 05/17/2018 8:36 AM Staffing Anesthesiologist: Leilani AbleHatchett, Taquisha Phung, MD Performed: anesthesiologist  Preanesthetic Checklist Completed: patient identified, site marked, surgical consent, pre-op evaluation, timeout performed, IV checked, risks and benefits discussed and monitors and equipment checked Spinal Block Patient position: sitting Prep: site prepped and draped and DuraPrep Patient monitoring: continuous pulse ox and blood pressure Approach: midline Location: L2-3 Injection technique: single-shot Needle Needle type: Pencan  Needle gauge: 24 G Needle length: 10 cm Needle insertion depth: 6 cm Assessment Sensory level: T8

## 2018-05-17 NOTE — Interval H&P Note (Signed)
History and Physical Interval Note:  05/17/2018 8:35 AM  Colleen Larsen  has presented today for surgery, with the diagnosis of right knee osteoarthritis  The various methods of treatment have been discussed with the patient and family. After consideration of risks, benefits and other options for treatment, the patient has consented to  Procedure(s): RIGHT TOTAL KNEE ARTHROPLASTY (Right) as a surgical intervention .  The patient's history has been reviewed, patient examined, no change in status, stable for surgery.  I have reviewed the patient's chart and labs.  Questions were answered to the patient's satisfaction.     Ranee Gosselinonald Valisha Heslin

## 2018-05-18 LAB — CBC
HCT: 34.1 % — ABNORMAL LOW (ref 36.0–46.0)
Hemoglobin: 11.3 g/dL — ABNORMAL LOW (ref 12.0–15.0)
MCH: 30.5 pg (ref 26.0–34.0)
MCHC: 33.1 g/dL (ref 30.0–36.0)
MCV: 91.9 fL (ref 78.0–100.0)
PLATELETS: 181 10*3/uL (ref 150–400)
RBC: 3.71 MIL/uL — ABNORMAL LOW (ref 3.87–5.11)
RDW: 14 % (ref 11.5–15.5)
WBC: 13.6 10*3/uL — ABNORMAL HIGH (ref 4.0–10.5)

## 2018-05-18 LAB — BASIC METABOLIC PANEL
Anion gap: 9 (ref 5–15)
BUN: 13 mg/dL (ref 6–20)
CALCIUM: 8.6 mg/dL — AB (ref 8.9–10.3)
CO2: 26 mmol/L (ref 22–32)
CREATININE: 0.65 mg/dL (ref 0.44–1.00)
Chloride: 104 mmol/L (ref 98–111)
GFR calc Af Amer: >60 mL/min (ref >60)
GLUCOSE: 118 mg/dL — AB (ref 70–99)
Potassium: 3.6 mmol/L (ref 3.5–5.1)
SODIUM: 139 mmol/L (ref 135–145)

## 2018-05-18 MED ORDER — METHOCARBAMOL 500 MG PO TABS
500.0000 mg | ORAL_TABLET | Freq: Four times a day (QID) | ORAL | Status: DC | PRN
  Administered 2018-05-18 – 2018-05-19 (×4): 500 mg via ORAL
  Filled 2018-05-18 (×5): qty 1

## 2018-05-18 MED ORDER — OXYCODONE HCL 5 MG PO TABS
5.0000 mg | ORAL_TABLET | ORAL | Status: DC | PRN
  Administered 2018-05-18 – 2018-05-19 (×7): 15 mg via ORAL
  Filled 2018-05-18 (×7): qty 3

## 2018-05-18 NOTE — Evaluation (Signed)
Occupational Therapy Evaluation Patient Details Name: Colleen Larsen MRN: [id number] DOB: Apr 26, 1966 Today's Date: 05/18/2018    History of Present Illness Pt s/p R TKR with insertion of bone graft in femoral canal   Clinical Impression   This 52 year old female was admitted for the above sx.  She is TDWB at this time. She will benefit from continued OT to increase safety and independence with adls. Goals are for supervision to min guard assist.    Follow Up Recommendations  Supervision/Assistance - 24 hour    Equipment Recommendations  (likely none; has a high toilet)    Recommendations for Other Services       Precautions / Restrictions Precautions Precautions: Fall Required Braces or Orthoses: Knee Immobilizer - Right Restrictions RLE Weight Bearing: Touchdown weight bearing      Mobility Bed Mobility         Supine to sit: Min assist     General bed mobility comments: light assist for RLE  Transfers   Equipment used: Rolling walker (2 wheeled)   Sit to Stand: Min guard         General transfer comment: cues for UE/LE placement. Steadying assistance especially during adls    Balance                                           ADL either performed or assessed with clinical judgement   ADL Overall ADL's : Needs assistance/impaired Eating/Feeding: Independent   Grooming: Set up;Sitting   Upper Body Bathing: Set up;Sitting   Lower Body Bathing: Minimal assistance;Sit to/from stand   Upper Body Dressing : Set up;Sitting   Lower Body Dressing: Maximal assistance;Sit to/from stand                 General ADL Comments: performed ADL from EOB.  Pt maintained TDWB during adls. Educated on knee precautions     Vision         Perception     Praxis      Pertinent Vitals/Pain Pain Score: 6  Pain Location: R knee Pain Descriptors / Indicators: Aching;Sore Pain Intervention(s): Limited activity within patient's  tolerance;Monitored during session;Premedicated before session;Repositioned     Hand Dominance     Extremity/Trunk Assessment Upper Extremity Assessment Upper Extremity Assessment: Overall WFL for tasks assessed(R hand edema 2* infiltration)           Communication Communication Communication: No difficulties   Cognition Arousal/Alertness: Awake/alert Behavior During Therapy: WFL for tasks assessed/performed Overall Cognitive Status: Within Functional Limits for tasks assessed                                     General Comments       Exercises     Shoulder Instructions      Home Living Family/patient expects to be discharged to:: Private residence Living Arrangements: Spouse/significant other Available Help at Discharge: Family               Bathroom Shower/Tub: Walk-in Soil scientistshower   Bathroom Toilet: Handicapped height     Home Equipment: Shower seat;Hand held shower head   Additional Comments: husband and daughter are in an out; mostly 24/7.        Prior Functioning/Environment Level of Independence: Independent  OT Problem List: Decreased activity tolerance;Pain;Decreased knowledge of use of DME or AE      OT Treatment/Interventions: Self-care/ADL training;DME and/or AE instruction;Patient/family education    OT Goals(Current goals can be found in the care plan section) Acute Rehab OT Goals Patient Stated Goal: Regain IND OT Goal Formulation: With patient Time For Goal Achievement: 06/01/18 Potential to Achieve Goals: Good ADL Goals Pt Will Transfer to Toilet: with supervision;ambulating(high commode) Pt Will Perform Tub/Shower Transfer: Shower transfer;with min guard assist;shower seat(vs verbalize sequence)  OT Frequency: Min 2X/week   Barriers to D/C:            Co-evaluation              AM-PAC PT "6 Clicks" Daily Activity     Outcome Measure Help from another person eating meals?: None Help from  another person taking care of personal grooming?: A Little Help from another person toileting, which includes using toliet, bedpan, or urinal?: A Little Help from another person bathing (including washing, rinsing, drying)?: A Little Help from another person to put on and taking off regular upper body clothing?: A Little Help from another person to put on and taking off regular lower body clothing?: A Lot 6 Click Score: 18   End of Session    Activity Tolerance: Patient limited by fatigue Patient left: in bed;with call bell/phone within reach;with bed alarm set  OT Visit Diagnosis: Pain Pain - Right/Left: Right Pain - part of body: Knee                Time: 4098-1191 OT Time Calculation (min): 36 min Charges:  OT General Charges $OT Visit: 1 Visit OT Evaluation $OT Eval Low Complexity: 1 Low OT Treatments $Self Care/Home Management : 8-22 mins G-Codes:     [place], OTR/L [phone] 05/18/2018  Colleen Larsen 05/18/2018, 8:51 AM

## 2018-05-18 NOTE — Progress Notes (Signed)
Subjective: 1 Day Post-Op Procedure(s) (LRB): RIGHT TOTAL KNEE ARTHROPLASTY, INSERTION OF BONE GRAFT IN FEMORAL CANAL (Right) Patient reports pain as 3 on 0-10 scale. Excellent Posterior Tibial Pulse and foot is warm.Good function in her Right Foot.Hemovac DCd and wound looks fine.   Objective: Vital signs in last 24 hours: Temp:  [97.7 F (36.5 C)-98.3 F (36.8 C)] 98.2 F (36.8 C) (07/18 0521) Pulse Rate:  [67-84] 80 (07/18 0521) Resp:  [13-18] 18 (07/18 0521) BP: (100-132)/(50-92) 125/68 (07/18 0521) SpO2:  [95 %-100 %] 98 % (07/18 0521)  Intake/Output from previous day: 07/17 0701 - 07/18 0700 In: 3135 [P.O.:520; I.V.:2615] Out: 2115 [Urine:2050; Drains:40; Blood:25] Intake/Output this shift: No intake/output data recorded.  Recent Labs    05/18/18 0621  HGB 11.3*   Recent Labs    05/18/18 0621  WBC 13.6*  RBC 3.71*  HCT 34.1*  PLT 181   Recent Labs    05/17/18 0637 05/18/18 0621  NA  --  139  K 5.3* 3.6  CL  --  104  CO2  --  26  BUN  --  13  CREATININE  --  0.65  GLUCOSE  --  118*  CALCIUM  --  8.6*   No results for input(s): LABPT, INR in the last 72 hours.  Neurovascular intact Dorsiflexion/Plantar flexion intact No cellulitis present  Anticipated LOS equal to or greater than 2 midnights due to - Age 54 and older with one or more of the following:  - Obesity  - Expected need for hospital services (PT, OT, Nursing) required for safe  discharge  - Anticipated need for postoperative skilled nursing care or inpatient rehab  - Active co-morbidities: Chronic pain requiring opiods OR   - Unanticipated findings during/Post Surgery: None  - Patient is a high risk of re-admission due to: Non-elective hospital admission within previous 6 months   Assessment/Plan: 1 Day Post-Op Procedure(s) (LRB): RIGHT TOTAL KNEE ARTHROPLASTY, INSERTION OF BONE GRAFT IN FEMORAL CANAL (Right) Up with therapy    Ranee Gosselinonald Akshath Mccarey 05/18/2018, 7:17 AM

## 2018-05-18 NOTE — Progress Notes (Signed)
Physical Therapy Treatment Patient Details Name: Colleen MasonBarbara B Larsen MRN: [id number] DOB: 06-12-1966 Today's Date: 05/18/2018    History of Present Illness Pt s/p R TKR with insertion of bone graft in femoral canal    PT Comments    Pt very cooperative but ltd by pain and NWB on R LE.   Follow Up Recommendations  Home health PT     Equipment Recommendations  Rolling walker with 5" wheels    Recommendations for Other Services       Precautions / Restrictions Precautions Precautions: Fall Required Braces or Orthoses: Knee Immobilizer - Right Restrictions Weight Bearing Restrictions: Yes RLE Weight Bearing: Touchdown weight bearing    Mobility  Bed Mobility Overal bed mobility: Needs Assistance Bed Mobility: Supine to Sit;Sit to Supine     Supine to sit: Min assist Sit to supine: Min assist   General bed mobility comments: Increased time, cues for sequence and assist for R LE  Transfers Overall transfer level: Needs assistance Equipment used: Rolling walker (2 wheeled) Transfers: Sit to/from Stand Sit to Stand: Min guard         General transfer comment: cues for UE/LE placement. Steadying assistance   Ambulation/Gait Ambulation/Gait assistance: Min assist Gait Distance (Feet): 50 Feet(and 15' into bathroom) Assistive device: Rolling walker (2 wheeled) Gait Pattern/deviations: Step-to pattern;Decreased step length - right;Decreased step length - left;Shuffle;Trunk flexed Gait velocity: decr   General Gait Details: cues for sequence, posture, position from RW and TDWB on R LE.  Pt demonstrating good compliance with TDWB   Stairs             Wheelchair Mobility    Modified Rankin (Stroke Patients Only)       Balance                                            Cognition Arousal/Alertness: Awake/alert Behavior During Therapy: WFL for tasks assessed/performed Overall Cognitive Status: Within Functional Limits for tasks  assessed                                        Exercises Total Joint Exercises Ankle Circles/Pumps: AROM;Both;15 reps;Supine Quad Sets: AROM;Both;5 reps;Supine Heel Slides: AAROM;5 reps;Supine;Right Straight Leg Raises: AAROM;Right;5 reps;Supine    General Comments        Pertinent Vitals/Pain Pain Assessment: 0-10 Pain Score: 7  Pain Location: R knee Pain Descriptors / Indicators: Aching;Sore Pain Intervention(s): Limited activity within patient's tolerance;Monitored during session;Premedicated before session;Ice applied    Home Living                      Prior Function            PT Goals (current goals can now be found in the care plan section) Acute Rehab PT Goals Patient Stated Goal: Regain IND PT Goal Formulation: With patient Time For Goal Achievement: 05/24/18 Potential to Achieve Goals: Good Progress towards PT goals: Progressing toward goals    Frequency    7X/week      PT Plan Current plan remains appropriate    Co-evaluation              AM-PAC PT "6 Clicks" Daily Activity  Outcome Measure  Difficulty turning over in bed (including adjusting bedclothes, sheets and blankets)?:  Unable Difficulty moving from lying on back to sitting on the side of the bed? : Unable Difficulty sitting down on and standing up from a chair with arms (e.g., wheelchair, bedside commode, etc,.)?: Unable Help needed moving to and from a bed to chair (including a wheelchair)?: A Little Help needed walking in hospital room?: A Little Help needed climbing 3-5 steps with a railing? : A Lot 6 Click Score: 11    End of Session Equipment Utilized During Treatment: Gait belt;Right knee immobilizer Activity Tolerance: Patient tolerated treatment well Patient left: in bed Nurse Communication: Mobility status PT Visit Diagnosis: Difficulty in walking, not elsewhere classified (R26.2)     Time: 1410-1440 PT Time Calculation (min) (ACUTE  ONLY): 30 min  Charges:  $Gait Training: 8-22 mins $Therapeutic Exercise: 8-22 mins                    G Codes:       Pg [phone]    Alexee Delsanto 05/18/2018, 5:09 PM

## 2018-05-18 NOTE — Progress Notes (Signed)
Spoke with patient at bedside. Confirmed plan for OP PT, already arranged. Needs a RW, contacted [facility] to deliver to the room. [phone] 

## 2018-05-19 ENCOUNTER — Inpatient Hospital Stay (HOSPITAL_COMMUNITY): Payer: BLUE CROSS/BLUE SHIELD

## 2018-05-19 LAB — CBC
HCT: 32.8 % — ABNORMAL LOW (ref 36.0–46.0)
HEMOGLOBIN: 11 g/dL — AB (ref 12.0–15.0)
MCH: 30.6 pg (ref 26.0–34.0)
MCHC: 33.5 g/dL (ref 30.0–36.0)
MCV: 91.1 fL (ref 78.0–100.0)
Platelets: 205 10*3/uL (ref 150–400)
RBC: 3.6 MIL/uL — ABNORMAL LOW (ref 3.87–5.11)
RDW: 14.3 % (ref 11.5–15.5)
WBC: 10.5 10*3/uL (ref 4.0–10.5)

## 2018-05-19 LAB — BASIC METABOLIC PANEL
Anion gap: 10 (ref 5–15)
BUN: 8 mg/dL (ref 6–20)
CALCIUM: 8.7 mg/dL — AB (ref 8.9–10.3)
CO2: 30 mmol/L (ref 22–32)
CREATININE: 0.59 mg/dL (ref 0.44–1.00)
Chloride: 97 mmol/L — ABNORMAL LOW (ref 98–111)
GFR calc Af Amer: >60 mL/min (ref >60)
GFR calc non Af Amer: >60 mL/min (ref >60)
GLUCOSE: 109 mg/dL — AB (ref 70–99)
Potassium: 3.1 mmol/L — ABNORMAL LOW (ref 3.5–5.1)
Sodium: 137 mmol/L (ref 135–145)

## 2018-05-19 MED ORDER — OXYCODONE HCL 5 MG PO TABS: 5.0000 mg | ORAL_TABLET | ORAL | 0 refills | Status: DC | PRN

## 2018-05-19 MED ORDER — METHOCARBAMOL 500 MG PO TABS: 500.0000 mg | ORAL_TABLET | Freq: Four times a day (QID) | ORAL | 0 refills | Status: DC | PRN

## 2018-05-19 MED ORDER — IOPAMIDOL (ISOVUE-370) INJECTION 76%
INTRAVENOUS | Status: AC
  Filled 2018-05-19: qty 100

## 2018-05-19 MED ORDER — IOPAMIDOL (ISOVUE-370) INJECTION 76%
100.0000 mL | Freq: Once | INTRAVENOUS | Status: AC | PRN
  Administered 2018-05-19: 100 mL via INTRAVENOUS

## 2018-05-19 MED ORDER — ASPIRIN EC 325 MG PO TBEC: 325.0000 mg | DELAYED_RELEASE_TABLET | Freq: Two times a day (BID) | ORAL | 0 refills | Status: DC

## 2018-05-19 NOTE — Progress Notes (Signed)
Dr. Darrelyn HillockGioffre spoke with the patient and the radiologist regarding the CT angio right LE. Results are WNL. Move forward with DC home today. Follow up in office on Monday 7/22.    Dimitri PedAmber Agustin Swatek, PA-C

## 2018-05-19 NOTE — Plan of Care (Signed)
Pt alert and oriented, resting with pain well controlled.  Plan to d/c home per MD order.  RN will monitor.

## 2018-05-19 NOTE — Progress Notes (Signed)
   Subjective: 2 Days Post-Op Procedure(s) (LRB): RIGHT TOTAL KNEE ARTHROPLASTY, INSERTION OF BONE GRAFT IN FEMORAL CANAL (Right) Patient reports pain as moderate.   Patient seen in rounds with Dr. Darrelyn HillockGioffre. Patient is well, and has had no acute complaints or problems other than pain in the right knee. She denies SOB and chest pain. Voiding well. Reports flatus. No issues overnight.  Plan is to go Home after hospital stay.  Objective: Vital signs in last 24 hours: Temp:  [98 F (36.7 C)-98.4 F (36.9 C)] 98.4 F (36.9 C) (07/19 0457) Pulse Rate:  [73-87] 73 (07/19 0457) Resp:  [14-16] 16 (07/19 0457) BP: (115-146)/(69-81) 115/77 (07/19 0457) SpO2:  [95 %-100 %] 98 % (07/19 0457)  Intake/Output from previous day:  Intake/Output Summary (Last 24 hours) at 05/19/2018 0705 Last data filed at 05/19/2018 0600 Gross per 24 hour  Intake 1490 ml  Output 2850 ml  Net -1360 ml    Intake/Output this shift: No intake/output data recorded.  Labs: Recent Labs    05/18/18 0621 05/19/18 0613  HGB 11.3* 11.0*   Recent Labs    05/18/18 0621 05/19/18 0613  WBC 13.6* 10.5  RBC 3.71* 3.60*  HCT 34.1* 32.8*  PLT 181 205   Recent Labs    05/18/18 0621 05/19/18 0613  NA 139 137  K 3.6 3.1*  CL 104 97*  CO2 26 30  BUN 13 8  CREATININE 0.65 0.59  GLUCOSE 118* 109*  CALCIUM 8.6* 8.7*    EXAM General - Patient is Alert and Oriented Extremity - Neurologically intact Intact pulses distally Dorsiflexion/Plantar flexion intact No cellulitis present Compartment soft Dressing/Incision - clean, dry, no drainage Motor Function - intact, moving foot and toes well on exam.   Past Medical History:  Diagnosis Date  . Arthritis    OA BOTH KNEES  . GERD (gastroesophageal reflux disease)   . Hypertension     Assessment/Plan: 2 Days Post-Op Procedure(s) (LRB): RIGHT TOTAL KNEE ARTHROPLASTY, INSERTION OF BONE GRAFT IN FEMORAL CANAL (Right) Active Problems:   Hx of total knee  replacement, right  Estimated body mass index is 29.38 kg/m as calculated from the following:   Height as of this encounter: 5\' 6"  (1.676 m).   Weight as of this encounter: 82.6 kg (182 lb). Advance diet Up with therapy  DVT Prophylaxis - Xarelto- will transition to aspirin 325 BID once home Partial weightbearing- touch down with walker right LE  Continue therapy this morning. Unable to increase pain medicine any further at this point. Supplement with Tylenol. Follow up in office on Monday.   Dimitri PedAmber Chevy Sweigert, PA-C Orthopaedic Surgery 05/19/2018, 7:05 AM

## 2018-05-19 NOTE — Progress Notes (Signed)
Physical Therapy Treatment Patient Details Name: Colleen MasonBarbara B Dollins MRN: [id number] DOB: 1965/12/30 Today's Date: 05/19/2018    History of Present Illness Pt s/p R TKR with insertion of bone graft in femoral canal    PT Comments    Pt continues very cooperative but progressing slowly with mobility and ROM 2* pain   Follow Up Recommendations  Home health PT     Equipment Recommendations  Rolling walker with 5" wheels    Recommendations for Other Services       Precautions / Restrictions Precautions Precautions: Fall Required Braces or Orthoses: Knee Immobilizer - Right Restrictions Weight Bearing Restrictions: Yes RLE Weight Bearing: Touchdown weight bearing    Mobility  Bed Mobility               General bed mobility comments: OOB with OT - requests back to chair  Transfers Overall transfer level: Needs assistance Equipment used: Rolling walker (2 wheeled) Transfers: Sit to/from Stand Sit to Stand: Min guard         General transfer comment: cues for UE/LE placement. Steadying assistance   Ambulation/Gait Ambulation/Gait assistance: Min guard Gait Distance (Feet): 60 Feet Assistive device: Rolling walker (2 wheeled) Gait Pattern/deviations: Step-to pattern;Decreased step length - right;Decreased step length - left;Shuffle;Trunk flexed Gait velocity: decr   General Gait Details: min cues for sequence, posture, position from RW and TDWB on R LE.  Pt demonstrating good compliance with TDWB   Stairs Stairs: Yes Stairs assistance: Min assist Stair Management: One rail Left;Step to pattern;Forwards;With crutches Number of Stairs: 2 General stair comments: cues for sequence and crutch/foot placement.  Written instruction provided   Wheelchair Mobility    Modified Rankin (Stroke Patients Only)       Balance                                            Cognition Arousal/Alertness: Awake/alert Behavior During Therapy: WFL for  tasks assessed/performed Overall Cognitive Status: Within Functional Limits for tasks assessed                                        Exercises Total Joint Exercises Ankle Circles/Pumps: AROM;Both;15 reps;Supine Quad Sets: AROM;Both;Supine;10 reps Heel Slides: AAROM;Supine;Right;10 reps Straight Leg Raises: AAROM;Right;Supine;10 reps Goniometric ROM: AAROM R knee -10- 25 - pain limited    General Comments        Pertinent Vitals/Pain Pain Assessment: 0-10 Pain Score: 6  Pain Location: R knee Pain Descriptors / Indicators: Aching;Sore Pain Intervention(s): Limited activity within patient's tolerance;Monitored during session;Premedicated before session;RN gave pain meds during session;Ice applied    Home Living                      Prior Function            PT Goals (current goals can now be found in the care plan section) Acute Rehab PT Goals Patient Stated Goal: Regain IND PT Goal Formulation: With patient Time For Goal Achievement: 05/24/18 Potential to Achieve Goals: Good Progress towards PT goals: Progressing toward goals    Frequency    7X/week      PT Plan Current plan remains appropriate    Co-evaluation              AM-PAC PT "  6 Clicks" Daily Activity  Outcome Measure  Difficulty turning over in bed (including adjusting bedclothes, sheets and blankets)?: Unable Difficulty moving from lying on back to sitting on the side of the bed? : A Lot Difficulty sitting down on and standing up from a chair with arms (e.g., wheelchair, bedside commode, etc,.)?: A Lot Help needed moving to and from a bed to chair (including a wheelchair)?: A Little Help needed walking in hospital room?: A Little Help needed climbing 3-5 steps with a railing? : A Little 6 Click Score: 14    End of Session Equipment Utilized During Treatment: Gait belt;Right knee immobilizer Activity Tolerance: Patient tolerated treatment well;Patient limited by  pain Patient left: in chair;with call bell/phone within reach;with family/visitor present Nurse Communication: Mobility status PT Visit Diagnosis: Difficulty in walking, not elsewhere classified (R26.2)     Time: 1050-1120 PT Time Calculation (min) (ACUTE ONLY): 30 min  Charges:  $Gait Training: 8-22 mins $Therapeutic Exercise: 8-22 mins                    G Codes:       Pg [phone]    Danile Trier 05/19/2018, 11:56 AM

## 2018-05-19 NOTE — Progress Notes (Signed)
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   OT comments  Education completed today  Follow Up Recommendations  Supervision/Assistance - 24 hour    Equipment Recommendations  (can borrow 3:1)    Recommendations for Other Services      Precautions / Restrictions Precautions Precautions: Fall Required Braces or Orthoses: Knee Immobilizer - Right Restrictions Weight Bearing Restrictions: Yes RLE Weight Bearing: Touchdown weight bearing       Mobility Bed Mobility         Supine to sit: Min assist     General bed mobility comments: assist for RLE  Transfers Overall transfer level: Needs assistance Equipment used: Rolling walker (2 wheeled) Transfers: Sit to/from Stand Sit to Stand: Min guard         General transfer comment: cues for UE/LE placement. Steadying assistance     Balance                                           ADL either performed or assessed with clinical judgement   ADL                       Lower Body Dressing: Moderate assistance;Sit to/from stand   Toilet Transfer: Min guard;Ambulation;RW;Comfort height toilet   Toileting- Clothing Manipulation and Hygiene: Min guard;Sit to/from stand         General ADL Comments: pt got dressed and used commode. Demonstrated shower transfer, when she feels ready     Vision       Perception     Praxis      Cognition Arousal/Alertness: Awake/alert Behavior During Therapy: WFL for tasks assessed/performed Overall Cognitive Status: Within Functional Limits for tasks assessed                                          Exercises    Shoulder Instructions       General Comments      Pertinent Vitals/ Pain       Pain Assessment: 0-10 Pain Score: 6  Pain Location: R  knee Pain Descriptors / Indicators: Aching;Sore Pain Intervention(s): Limited activity within patient's tolerance;Monitored during session;Premedicated before session;RN gave pain meds during session;Ice applied  Home Living                                          Prior Functioning/Environment              Frequency           Progress Toward Goals  OT Goals(current goals can now be found in the care plan section)  Progress towards OT goals: Progressing toward goals(near goal status:  will have assistance)  Acute Rehab OT Goals Patient Stated Goal: Regain IND  Plan      Co-evaluation                 AM-PAC PT "6 Clicks" Daily Activity     Outcome Measure   Help from another person eating meals?: None Help  from another person taking care of personal grooming?: A Little Help from another person toileting, which includes using toliet, bedpan, or urinal?: A Little Help from another person bathing (including washing, rinsing, drying)?: A Little Help from another person to put on and taking off regular upper body clothing?: A Little Help from another person to put on and taking off regular lower body clothing?: A Lot 6 Click Score: 18    End of Session    OT Visit Diagnosis: Pain Pain - Right/Left: Right Pain - part of body: Knee   Activity Tolerance Patient tolerated treatment well   Patient Left in chair;with call bell/phone within reach;with family/visitor present   Nurse Communication          Time: 4098-1191 OT Time Calculation (min): 20 min  Charges: OT General Charges $OT Visit: 1 Visit OT Treatments $Self Care/Home Management : 8-22 mins  Colleen Larsen, OTR/L [phone] 05/19/2018   Colleen Larsen 05/19/2018, 12:02 PM

## 2018-05-22 NOTE — Discharge Summary (Signed)
Physician Discharge Summary   Patient ID: Colleen Larsen MRN: [id number] DOB/AGE: 11-13-1965 52 y.o.  Admit date: 05/17/2018 Discharge date: 05/19/2018  Primary Diagnosis: Primary osteoarthritis right knee   Admission Diagnoses:  Past Medical History:  Diagnosis Date  . Arthritis    OA BOTH KNEES  . GERD (gastroesophageal reflux disease)   . Hypertension    Discharge Diagnoses:   Active Problems:   Hx of total knee replacement, right  Estimated body mass index is 29.38 kg/m as calculated from the following:   Height as of this encounter: _0  (1.676 m).   Weight as of this encounter: 82.6 kg (182 lb).  Procedure:  Procedure(s) (LRB): RIGHT TOTAL KNEE ARTHROPLASTY, INSERTION OF BONE GRAFT IN FEMORAL CANAL (Right)   Consults: None  HPI: Colleen Larsen, 52 y.o. female, has a history of pain and functional disability in the right knee due to arthritis and has failed non-surgical conservative treatments for greater than 12 weeks to includeNSAID's and/or analgesics, corticosteriod injections, flexibility and strengthening excercises and activity modification.  Onset of symptoms was gradual, starting 5 years ago with gradually worsening course since that time. The patient noted prior procedures on the knee to include  arthroscopy and menisectomy on the right knee(s).  Patient currently rates pain in the right knee(s) at 5 out of 10 with activity. Patient has night pain, worsening of pain with activity and weight bearing, pain that interferes with activities of daily living, pain with passive range of motion, crepitus and joint swelling.  Patient has evidence of periarticular osteophytes and joint space narrowing by imaging studies. There is no active infection.     Laboratory Data: Admission on 05/17/2018, Discharged on 05/19/2018  Component Date Value Ref Range Status  . Potassium 05/17/2018 5.3* 3.5 - 5.1 mmol/L Final   Performed at [facility]  [address] [place], [place] [address]  . WBC 05/18/2018 13.6* 4.0 - 10.5 K/uL Final  . RBC 05/18/2018 3.71* 3.87 - 5.11 MIL/uL Final  . Hemoglobin 05/18/2018 11.3* 12.0 - 15.0 g/dL Final  . HCT 05/18/2018 34.1* 36.0 - 46.0 % Final  . MCV 05/18/2018 91.9  78.0 - 100.0 fL Final  . MCH 05/18/2018 30.5  26.0 - 34.0 pg Final  . MCHC 05/18/2018 33.1  30.0 - 36.0 g/dL Final  . RDW 05/18/2018 14.0  11.5 - 15.5 % Final  . Platelets 05/18/2018 181  150 - 400 K/uL Final   Performed at [facility], [place] [address] [place], [place] [address]  . Sodium 05/18/2018 139  135 - 145 mmol/L Final  . Potassium 05/18/2018 3.6  3.5 - 5.1 mmol/L Final   Comment: DELTA CHECK NOTED REPEATED TO VERIFY   . Chloride 05/18/2018 104  98 - 111 mmol/L Final   Please note change in reference range.  . CO2 05/18/2018 26  22 - 32 mmol/L Final  . Glucose, Bld 05/18/2018 118* 70 - 99 mg/dL Final   Please note change in reference range.  . BUN 05/18/2018 13  6 - 20 mg/dL Final   Please note change in reference range.  . Creatinine, Ser 05/18/2018 0.65  0.44 - 1.00 mg/dL Final  . Calcium 05/18/2018 8.6* 8.9 - 10.3 mg/dL Final  . GFR calc non Af Amer 05/18/2018 >60  >60 mL/min Final  . GFR calc Af Amer 05/18/2018 >60  >60 mL/min Final   Comment: (NOTE) The eGFR has been calculated using the CKD EPI equation. This calculation has not  been validated in all clinical situations. eGFR's persistently <60 mL/min signify possible Chronic Kidney Disease.   Georgiann Hahn gap 05/18/2018 9  5 - 15 Final   Performed at [facility], [place] [address], [place], [place] [address]  . WBC 05/19/2018 10.5  4.0 - 10.5 K/uL Final  . RBC 05/19/2018 3.60* 3.87 - 5.11 MIL/uL Final  . Hemoglobin 05/19/2018 11.0* 12.0 - 15.0 g/dL Final  . HCT 05/19/2018 32.8* 36.0 - 46.0 % Final  . MCV 05/19/2018 91.1  78.0 - 100.0 fL Final  . MCH 05/19/2018 30.6  26.0 - 34.0 pg Final  . MCHC 05/19/2018 33.5  30.0 - 36.0 g/dL  Final  . RDW 05/19/2018 14.3  11.5 - 15.5 % Final  . Platelets 05/19/2018 205  150 - 400 K/uL Final   Performed at [facility], [place] [address] [place], [place] [address]  . Sodium 05/19/2018 137  135 - 145 mmol/L Final  . Potassium 05/19/2018 3.1* 3.5 - 5.1 mmol/L Final  . Chloride 05/19/2018 97* 98 - 111 mmol/L Final   Please note change in reference range.  . CO2 05/19/2018 30  22 - 32 mmol/L Final  . Glucose, Bld 05/19/2018 109* 70 - 99 mg/dL Final   Please note change in reference range.  . BUN 05/19/2018 8  6 - 20 mg/dL Final   Please note change in reference range.  . Creatinine, Ser 05/19/2018 0.59  0.44 - 1.00 mg/dL Final  . Calcium 05/19/2018 8.7* 8.9 - 10.3 mg/dL Final  . GFR calc non Af Amer 05/19/2018 >60  >60 mL/min Final  . GFR calc Af Amer 05/19/2018 >60  >60 mL/min Final   Comment: (NOTE) The eGFR has been calculated using the CKD EPI equation. This calculation has not been validated in all clinical situations. eGFR's persistently <60 mL/min signify possible Chronic Kidney Disease.   Georgiann Hahn gap 05/19/2018 10  5 - 15 Final   Performed at [facility], [place] [address] [place], [place] [address]  Hospital Outpatient Visit on 05/10/2018  Component Date Value Ref Range Status  . aPTT 05/10/2018 27  24 - 36 seconds Final   Performed at [facility], [place] [address], [place], [place] [address]  . WBC 05/10/2018 9.1  4.0 - 10.5 K/uL Final  . RBC 05/10/2018 4.35  3.87 - 5.11 MIL/uL Final  . Hemoglobin 05/10/2018 13.4  12.0 - 15.0 g/dL Final  . HCT 05/10/2018 38.5  36.0 - 46.0 % Final  . MCV 05/10/2018 88.5  78.0 - 100.0 fL Final  . MCH 05/10/2018 30.8  26.0 - 34.0 pg Final  . MCHC 05/10/2018 34.8  30.0 - 36.0 g/dL Final  . RDW 05/10/2018 13.5  11.5 - 15.5 % Final  . Platelets 05/10/2018 195  150 - 400 K/uL Final  . Neutrophils Relative % 05/10/2018 64  % Final  . Neutro Abs 05/10/2018 5.8  1.7 - 7.7 K/uL Final    . Lymphocytes Relative 05/10/2018 28  % Final  . Lymphs Abs 05/10/2018 2.5  0.7 - 4.0 K/uL Final  . Monocytes Relative 05/10/2018 6  % Final  . Monocytes Absolute 05/10/2018 0.6  0.1 - 1.0 K/uL Final  . Eosinophils Relative 05/10/2018 2  % Final  . Eosinophils Absolute 05/10/2018 0.2  0.0 - 0.7 K/uL Final  . Basophils Relative 05/10/2018 0  % Final  . Basophils Absolute 05/10/2018 0.0  0.0 - 0.1 K/uL Final   Performed at [facility]  [place] [address], [place], [place] [address]  . Sodium 05/10/2018 139  135 - 145 mmol/L Final  . Potassium 05/10/2018 2.7* 3.5 - 5.1 mmol/L Final   Comment: REPEATED TO VERIFY CRITICAL RESULT CALLED TO, READ BACK BY AND VERIFIED WITH: HESTER,T. RN AT 5284 05/10/18 MULLINS,T   . Chloride 05/10/2018 100  98 - 111 mmol/L Final   Please note change in reference range.  . CO2 05/10/2018 28  22 - 32 mmol/L Final  . Glucose, Bld 05/10/2018 114* 70 - 99 mg/dL Final   Please note change in reference range.  . BUN 05/10/2018 13  6 - 20 mg/dL Final   Please note change in reference range.  . Creatinine, Ser 05/10/2018 0.75  0.44 - 1.00 mg/dL Final  . Calcium 05/10/2018 9.1  8.9 - 10.3 mg/dL Final  . Total Protein 05/10/2018 7.2  6.5 - 8.1 g/dL Final  . Albumin 05/10/2018 4.1  3.5 - 5.0 g/dL Final  . AST 05/10/2018 25  15 - 41 U/L Final  . ALT 05/10/2018 20  0 - 44 U/L Final   Please note change in reference range.  . Alkaline Phosphatase 05/10/2018 94  38 - 126 U/L Final  . Total Bilirubin 05/10/2018 0.5  0.3 - 1.2 mg/dL Final  . GFR calc non Af Amer 05/10/2018 >60  >60 mL/min Final  . GFR calc Af Amer 05/10/2018 >60  >60 mL/min Final   Comment: (NOTE) The eGFR has been calculated using the CKD EPI equation. This calculation has not been validated in all clinical situations. eGFR's persistently <60 mL/min signify possible Chronic Kidney Disease.   Georgiann Hahn gap 05/10/2018 11  5 - 15 Final   Performed at [facility], [place] [address] [place], [place] [address]  . Prothrombin Time 05/10/2018 11.8  11.4 - 15.2 seconds Final  . INR 05/10/2018 0.87   Final   Performed at [facility], [place] [address], [place], [place] [address]  . ABO/RH(D) 05/10/2018 A POS   Final  . Antibody Screen 05/10/2018 NEG   Final  . Sample Expiration 05/10/2018 05/20/2018   Final  . Extend sample reason 05/10/2018    Final                   Value:NO TRANSFUSIONS OR PREGNANCY IN THE PAST 3 MONTHS Performed at [facility], [place] [address], [place], [place] [address]   . Color, Urine 05/10/2018 STRAW* YELLOW Final  . APPearance 05/10/2018 CLEAR  CLEAR Final  . Specific Gravity, Urine 05/10/2018 1.006  1.005 - 1.030 Final  . pH 05/10/2018 6.0  5.0 - 8.0 Final  . Glucose, UA 05/10/2018 NEGATIVE  NEGATIVE mg/dL Final  . Hgb urine dipstick 05/10/2018 SMALL* NEGATIVE Final  . Bilirubin Urine 05/10/2018 NEGATIVE  NEGATIVE Final  . Ketones, ur 05/10/2018 NEGATIVE  NEGATIVE mg/dL Final  . Protein, ur 05/10/2018 NEGATIVE  NEGATIVE mg/dL Final  . Nitrite 05/10/2018 NEGATIVE  NEGATIVE Final  . Leukocytes, UA 05/10/2018 NEGATIVE  NEGATIVE Final  . RBC / HPF 05/10/2018 0-5  0 - 5 RBC/hpf Final  . WBC, UA 05/10/2018 0-5  0 - 5 WBC/hpf Final  . Bacteria, UA 05/10/2018 NONE SEEN  NONE SEEN Final  . Squamous Epithelial / LPF 05/10/2018 0-5  0 - 5 Final  . Mucus 05/10/2018 PRESENT   Final   Performed at [facility], [place] [address], [place], [place] [address]  . MRSA, PCR  05/10/2018 NEGATIVE  NEGATIVE Final  . Staphylococcus aureus 05/10/2018 NEGATIVE  NEGATIVE Final   Comment: (NOTE) The Xpert SA Assay (FDA approved for NASAL specimens in patients 4 years of age and older), is one component of a comprehensive surveillance program. It is not intended to diagnose infection nor to guide or monitor treatment. Performed at [facility], [place] [address] [place], [place]  [address]   . ABO/RH(D) 05/10/2018    Final                   Value:A POS Performed at [facility], [place] [address], [place], [place] [address]      X-Rays:X-ray Knee Right Ap And Lateral  Result Date: 05/17/2018 CLINICAL DATA:  Right total knee arthroplasty EXAM: RIGHT KNEE - 1-2 VIEW COMPARISON:  None. FINDINGS: Status post right total knee arthroplasty, with well-positioned right distal femoral and right proximal tibial prostheses. No acute osseous fracture. No dislocation. No suspicious focal osseous lesions. Surgical drain terminates over the suprapatellar right knee joint. Expected gas within and surrounding the right knee joint. IMPRESSION: Satisfactory immediate postoperative appearance status post right total knee arthroplasty. Electronically Signed   By: Ilona Sorrel M.D.   On: 05/17/2018 11:54   Ct Angio Ao+bifem W & Or Wo Contrast  Result Date: 05/19/2018 CLINICAL DATA:  History of right total knee replacement with concern for inversion arterial injury. Please perform CTA run-off for diagnostic purposes. EXAM: CT ANGIOGRAPHY OF ABDOMINAL AORTA WITH ILIOFEMORAL RUNOFF TECHNIQUE: Multidetector CT imaging of the abdomen, pelvis and lower extremities was performed using the standard protocol during bolus administration of intravenous contrast. Multiplanar CT image reconstructions and MIPs were obtained to evaluate the vascular anatomy. CONTRAST:  161m ISOVUE-370 IOPAMIDOL (ISOVUE-370) INJECTION 76% COMPARISON:  Right knee radiographs - 05/17/2018; intraoperative fluoroscopic radiographs of the right knee - 05/17/2018 FINDINGS: VASCULAR Aorta: Normal caliber of the abdominal aorta. No evidence of abdominal aortic dissection or periaortic stranding. Celiac: Mild apparent narrowing/tortuosity involving the origin the celiac artery may be secondary to mass effect from the median arcuate ligament. Mild hypertrophy GDA and pancreaticoduodenal collaterals from the SMA. Conventional  branching pattern. SMA: Widely patent without hemodynamically significant narrowing. Conventional branching pattern. Renals: Solitary bilaterally. The bilateral renal arteries are widely patent without hemodynamically significant narrowing. No vessel irregularity to suggest FMD. IMA: Widely patent. RIGHT Lower Extremity Inflow: The normal caliber right common, external and internal iliac arteries are widely patent without hemodynamically significant stenosis. Outflow: The right common and deep femoral arteries are of normal caliber and widely patent without hemodynamically significant stenosis. The right superficial femoral artery is of normal caliber and widely patent without a hemodynamically significant stenosis, however there are ill-defined areas of active extravasation about the anterior distal aspect of the right superficial femoral artery at the level of the adductor canal (representative images 212, 214, 228 and 240), adjacent to cortical defect involving the medial aspect of the distal femoral metaphysis (image 251, series 8). There is no vessel wall irregularity involving the superficial femoral artery. No dissection. These findings are associated with an expected of all vein hematoma involving the medial compartment of the distal thigh. The right above and below-knee popliteal artery appears widely patent however evaluation is degraded secondary streak artifact from the right total hip prosthesis. Runoff: Three-vessel runoff to the right lower leg. The anterior tibial and peroneal arteries become atretic at the level of the ankle. The posterior tibial artery is patent to the level of the forefoot.  No discrete internal filling defect suggest distal embolism. LEFT Lower Extremity Inflow: The left common, external and internal iliac arteries are of normal caliber and widely patent without hemodynamically significant stenosis. Outflow: The left common, deep and superficial femoral arteries are of normal  caliber and widely patent. The left above and below-knee popliteal arteries are widely patent. Runoff: Three-vessel runoff to the left lower leg however both the left anterior tibial and peroneal artery, atretic at the level of the ankle. The left posterior tibial artery is patent to the level of the forefoot. Veins: There is mild asymmetric increased enhancement of the right superficial femoral vein however this is presumably secondary to hyperemia associated with recent right total knee replacement as a definitive AV fistula is not identified. Review of the MIP images confirms the above findings. _________________________________________________________ NON-VASCULAR Lower chest: Limited visualization of lower thorax demonstrates minimal subsegmental atelectasis within the imaged bilateral lower lobes. No discrete focal airspace opacities. No pleural effusion. Hepatobiliary: There is diffuse decreased attenuation hepatic parenchyma on this postcontrast examination suggestive of hepatic steatosis. No discrete hyperenhancing hepatic lesions. Common bile duct is dilated measuring approximately 1.6 cm in diameter presumably the sequela of post cholecystectomy state and biliary reservoir phenomenon. No intrahepatic bili duct dilatation. No ascites. Pancreas: Normal appearance of the pancreas Spleen: Normal appearance of the spleen. Note is made of 2 small splenules. Adrenals/Urinary Tract: There is symmetric enhancement of the bilateral kidneys. No definite renal stones on this postcontrast examination. No discrete renal lesions. No urinary obstruction or perinephric stranding. Normal appearance the bilateral adrenal glands. Normal appearance of the urinary bladder given degree distention. Stomach/Bowel: Moderate colonic stool burden without evidence of enteric obstruction. Normal appearance of the terminal ileum and retrocecal appendix. No pneumoperitoneum, pneumatosis or portal venous gas. Lymphatic: No bulky  retroperitoneal, mesenteric, pelvic or inguinal lymphadenopathy. Reproductive: Post hysterectomy. No discrete adnexal lesion. No free fluid within the pelvic cul-de-sac. Other: There is diffuse soft tissue swelling about the thigh and proximal calf with scattered foci of subcutaneous emphysema about the right total knee replacement operative site. There is a small hematoma involving the distal aspect of the thigh. No radiopaque foreign body. Musculoskeletal: Post total knee replacement, suboptimally evaluated due to streak artifact from the prosthesis. As above, there is a cortical defect involving the medial aspect of the distal right femoral metaphysis. IMPRESSION: VASCULAR 1. Several small areas of active extravasation are noted adjacent to the cortical defect involving the distal medial right femoral metaphysis with associated small hematoma but without definitive injury to the adjacent femoral or popliteal artery. No evidence of dissection or distal embolism. 2. No evidence of abdominal aortic aneurysm or dissection. NON-VASCULAR 1. No acute findings within the abdomen or pelvis. Critical Value/emergent results were called by telephone at the time of interpretation on 05/19/2018 at 9:58 am to Dr. Latanya Maudlin , who verbally acknowledged these results. Electronically Signed   By: Sandi Mariscal M.D.   On: 05/19/2018 10:15   Dg C-arm 1-60 Min-no Report  Result Date: 05/17/2018 Fluoroscopy was utilized by the requesting physician.  No radiographic interpretation.    Hospital Course: Colleen Larsen is a 52 y.o. who was admitted to [facility]. They were brought to the operating room on 05/17/2018 and underwent Procedure(s): RIGHT TOTAL KNEE ARTHROPLASTY, INSERTION OF BONE GRAFT IN FEMORAL CANAL.  Patient tolerated the procedure well and was later transferred to the recovery room and then to the orthopaedic floor for postoperative care.  They were given PO and  IV analgesics for pain control  following their surgery.  They were given 24 hours of postoperative antibiotics of  Anti-infectives (From admission, onward)   Start     Dose/Rate Route Frequency Ordered Stop   05/17/18 1430  ceFAZolin (ANCEF) IVPB 1 g/50 mL premix     1 g 100 mL/hr over 30 Minutes Intravenous Every 6 hours 05/17/18 1236 05/17/18 2146   05/17/18 0908  polymyxin B 500,000 Units, bacitracin 50,000 Units in sodium chloride 0.9 % 500 mL irrigation  Status:  Discontinued       As needed 05/17/18 0908 05/17/18 1106   05/17/18 0645  ceFAZolin (ANCEF) IVPB 2g/100 mL premix     2 g 200 mL/hr over 30 Minutes Intravenous On call to O.R. 05/17/18 [id number] 05/17/18 0834     and started on DVT prophylaxis in the form of Xarelto.   PT and OT were ordered for total joint protocol.  Discharge planning consulted to help with postop disposition and equipment needs.  Patient had a fair night on the evening of surgery.  They started to get up OOB with therapy on day one. She was placed on partial weightbearing due to some impact to her medial femoral cortex intraop. Hemovac drain was pulled without difficulty.  Continued to work with therapy into day two.  Dressing was changed on day two and the incision was clean and dry. The patient had progressed with therapy and meeting their goals.  Incision was healing well.  Patient was seen in rounds and was ready to go home.   Diet: Cardiac diet Activity:PWB Follow-up:in 3 days Disposition - Home Discharged Condition: stable   Discharge Instructions    Call MD / Call 911   Complete by:  As directed    If you experience chest pain or shortness of breath, CALL 911 and be transported to the hospital emergency room.  If you develope a fever above 101 F, pus (white drainage) or increased drainage or redness at the wound, or calf pain, call your surgeon's office.   Constipation Prevention   Complete by:  As directed    Drink plenty of fluids.  Prune juice may be helpful.  You may use a stool  softener, such as Colace (over the counter) 100 mg twice a day.  Use MiraLax (over the counter) for constipation as needed.   Diet - low sodium heart healthy   Complete by:  As directed    Discharge instructions   Complete by:  As directed    INSTRUCTIONS AFTER JOINT REPLACEMENT   Remove items at home which could result in a fall. This includes throw rugs or furniture in walking pathways ICE to the affected joint every three hours while awake for 30 minutes at a time, for at least the first 3-5 days, and then as needed for pain and swelling.  Continue to use ice for pain and swelling. You may notice swelling that will progress down to the foot and ankle.  This is normal after surgery.  Elevate your leg when you are not up walking on it.   Continue to use the breathing machine you got in the hospital (incentive spirometer) which will help keep your temperature down.  It is common for your temperature to cycle up and down following surgery, especially at night when you are not up moving around and exerting yourself.  The breathing machine keeps your lungs expanded and your temperature down.   DIET:  As you were doing prior to hospitalization,  we recommend a well-balanced diet.  DRESSING / WOUND CARE / SHOWERING  You may change your dressing every day with sterile gauze.  Please use good hand washing techniques before changing the dressing.  Do not use any lotions or creams on the incision until instructed by your surgeon.  ACTIVITY  Increase activity slowly as tolerated, but follow the weight bearing instructions below.   No driving for 6 weeks or until further direction given by your physician.  You cannot drive while taking narcotics.  No lifting or carrying greater than 10 lbs. until further directed by your surgeon. Avoid periods of inactivity such as sitting longer than an hour when not asleep. This helps prevent blood clots.  You may return to work once you are authorized by your doctor.       WEIGHT BEARING   Partial weight bearing with assist device as directed.  Touch down weightbearing with walker   EXERCISES  Results after joint replacement surgery are often greatly improved when you follow the exercise, range of motion and muscle strengthening exercises prescribed by your doctor. Safety measures are also important to protect the joint from further injury. Any time any of these exercises cause you to have increased pain or swelling, decrease what you are doing until you are comfortable again and then slowly increase them. If you have problems or questions, call your caregiver or physical therapist for advice.   Rehabilitation is important following a joint replacement. After just a few days of immobilization, the muscles of the leg can become weakened and shrink (atrophy).  These exercises are designed to build up the tone and strength of the thigh and leg muscles and to improve motion. Often times heat used for twenty to thirty minutes before working out will loosen up your tissues and help with improving the range of motion but do not use heat for the first two weeks following surgery (sometimes heat can increase post-operative swelling).   These exercises can be done on a training (exercise) mat, on the floor, on a table or on a bed. Use whatever works the best and is most comfortable for you.    Use music or television while you are exercising so that the exercises are a pleasant break in your day. This will make your life better with the exercises acting as a break in your routine that you can look forward to.   Perform all exercises about fifteen times, three times per day or as directed.  You should exercise both the operative leg and the other leg as well.  Exercises include:   Quad Sets - Tighten up the muscle on the front of the thigh (Quad) and hold for 5-10 seconds.   Straight Leg Raises - With your knee straight (if you were given a brace, keep it on), lift the  leg to 60 degrees, hold for 3 seconds, and slowly lower the leg.  Perform this exercise against resistance later as your leg gets stronger.  Leg Slides: Lying on your back, slowly slide your foot toward your buttocks, bending your knee up off the floor (only go as far as is comfortable). Then slowly slide your foot back down until your leg is flat on the floor again.  Angel Wings: Lying on your back spread your legs to the side as far apart as you can without causing discomfort.  Hamstring Strength:  Lying on your back, push your heel against the floor with your leg straight by tightening up the muscles  of your buttocks.  Repeat, but this time bend your knee to a comfortable angle, and push your heel against the floor.  You may put a pillow under the heel to make it more comfortable if necessary.   A rehabilitation program following joint replacement surgery can speed recovery and prevent re-injury in the future due to weakened muscles. Contact your doctor or a physical therapist for more information on knee rehabilitation.    CONSTIPATION  Constipation is defined medically as fewer than three stools per week and severe constipation as less than one stool per week.  Even if you have a regular bowel pattern at home, your normal regimen is likely to be disrupted due to multiple reasons following surgery.  Combination of anesthesia, postoperative narcotics, change in appetite and fluid intake all can affect your bowels.   YOU MUST use at least one of the following options; they are listed in order of increasing strength to get the job done.  They are all available over the counter, and you may need to use some, POSSIBLY even all of these options:    Drink plenty of fluids (prune juice may be helpful) and high fiber foods Colace 100 mg by mouth twice a day  Senokot for constipation as directed and as needed Dulcolax (bisacodyl), take with full glass of water  Miralax (polyethylene glycol) once or twice  a day as needed.  If you have tried all these things and are unable to have a bowel movement in the first 3-4 days after surgery call either your surgeon or your primary doctor.    If you experience loose stools or diarrhea, hold the medications until you stool forms back up.  If your symptoms do not get better within 1 week or if they get worse, check with your doctor.  If you experience "the worst abdominal pain ever" or develop nausea or vomiting, please contact the office immediately for further recommendations for treatment.   ITCHING:  If you experience itching with your medications, try taking only a single pain pill, or even half a pain pill at a time.  You can also use Benadryl over the counter for itching or also to help with sleep.   TED HOSE STOCKINGS:  Use stockings on both legs until for at least 2 weeks or as directed by physician office. They may be removed at night for sleeping.  MEDICATIONS:  See your medication summary on the "After Visit Summary" that nursing will review with you.  You may have some home medications which will be placed on hold until you complete the course of blood thinner medication.  It is important for you to complete the blood thinner medication as prescribed.  PRECAUTIONS:  If you experience chest pain or shortness of breath - call 911 immediately for transfer to the hospital emergency department.   If you develop a fever greater that 101 F, purulent drainage from wound, increased redness or drainage from wound, foul odor from the wound/dressing, or calf pain - CONTACT YOUR SURGEON.                                                   FOLLOW-UP APPOINTMENTS:  If you do not already have a post-op appointment, please call the office for an appointment to be seen by your surgeon.  Guidelines for how soon to  be seen are listed in your "After Visit Summary", but are typically between 1-4 weeks after surgery.  OTHER INSTRUCTIONS:   Knee Replacement:  Do not  place pillow under knee, focus on keeping the knee straight while resting. CPM instructions: 0-90 degrees, 2 hours in the morning, 2 hours in the afternoon, and 2 hours in the evening. Place foam block, curve side up under heel at all times except when in CPM or when walking.  DO NOT modify, tear, cut, or change the foam block in any way.  MAKE SURE YOU:  Understand these instructions.  Get help right away if you are not doing well or get worse.    Thank you for letting us be a part of your medical care team.  It is a privilege we respect greatly.  We hope these instructions will help you stay on track for a fast and full recovery!   Increase activity slowly as tolerated   Complete by:  As directed      Allergies as of 05/19/2018      Reactions   Celebrex [celecoxib] Hives      Medication List    TAKE these medications   aspirin EC 325 MG tablet Take 1 tablet (325 mg total) by mouth 2 (two) times daily.   cetirizine 10 MG tablet Commonly known as:  ZYRTEC Take 10 mg by mouth daily.   estradiol 0.0375 MG/24HR Commonly known as:  VIVELLE-DOT Place 1 patch onto the skin 2 (two) times a week. Notes to patient:  Take as prescribed   fluticasone 50 MCG/ACT nasal spray Commonly known as:  FLONASE Place 2 sprays into both nostrils 2 (two) times daily.   hydrochlorothiazide 25 MG tablet Commonly known as:  HYDRODIURIL Take 25 mg by mouth daily.   ibuprofen 200 MG tablet Commonly known as:  ADVIL,MOTRIN Take 600 mg by mouth every 4 (four) hours as needed for headache or moderate pain.   losartan 50 MG tablet Commonly known as:  COZAAR Take 50 mg by mouth 2 (two) times daily.   methocarbamol 500 MG tablet Commonly known as:  ROBAXIN Take 1 tablet (500 mg total) by mouth every 6 (six) hours as needed for muscle spasms.   nabumetone 500 MG tablet Commonly known as:  RELAFEN Take 500 mg by mouth 2 (two) times daily.   oxyCODONE 5 MG immediate release tablet Commonly known  as:  Oxy IR/ROXICODONE Take 1-3 tablets (5-15 mg total) by mouth every 4 (four) hours as needed for moderate pain (pain score 4-6).   PRAVACHOL 40 MG tablet Generic drug:  pravastatin Take 40 mg by mouth at bedtime.   ranitidine 150 MG tablet Commonly known as:  ZANTAC Take 150 mg by mouth daily as needed for heartburn.   Vitamin D 2000 units tablet Take 4,000 Units by mouth daily.      Follow-up Information    Latanya Maudlin, MD. Schedule an appointment as soon as possible for a visit on 05/22/2018.   Specialty:  Orthopedic Surgery Contact information: [address] [place] [address] [phone]        [facility] - Dme Follow up.   Why:  walker Contact information: [address] [place] [address] [phone]           Signed: Ardeen Jourdain, PA-C Orthopaedic Surgery 05/22/2018, 9:00 AM

## 2018-07-06 DIAGNOSIS — Z471 Aftercare following joint replacement surgery: Secondary | ICD-10-CM | POA: Insufficient documentation

## 2018-07-31 ENCOUNTER — Other Ambulatory Visit: Payer: Self-pay

## 2018-07-31 ENCOUNTER — Encounter (HOSPITAL_COMMUNITY): Payer: Self-pay | Admitting: *Deleted

## 2018-08-02 ENCOUNTER — Ambulatory Visit (HOSPITAL_COMMUNITY): Payer: BLUE CROSS/BLUE SHIELD | Admitting: Anesthesiology

## 2018-08-02 ENCOUNTER — Ambulatory Visit (HOSPITAL_COMMUNITY): Payer: BLUE CROSS/BLUE SHIELD

## 2018-08-02 ENCOUNTER — Encounter (HOSPITAL_COMMUNITY)
Admission: RE | Disposition: A | Discharge status: Home / Self Care | Payer: Self-pay | Source: Ambulatory Visit | Attending: Orthopedic Surgery

## 2018-08-02 ENCOUNTER — Ambulatory Visit (HOSPITAL_COMMUNITY)
Admission: RE | Admit: 2018-08-02 | Discharge: 2018-08-02 | Disposition: A | Discharge status: Home / Self Care | Payer: BLUE CROSS/BLUE SHIELD | Source: Ambulatory Visit | Attending: Orthopedic Surgery | Admitting: Orthopedic Surgery

## 2018-08-02 ENCOUNTER — Encounter (HOSPITAL_COMMUNITY): Payer: Self-pay | Admitting: General Practice

## 2018-08-02 DIAGNOSIS — M1712 Unilateral primary osteoarthritis, left knee: Secondary | ICD-10-CM | POA: Insufficient documentation

## 2018-08-02 DIAGNOSIS — I10 Essential (primary) hypertension: Secondary | ICD-10-CM | POA: Diagnosis not present

## 2018-08-02 DIAGNOSIS — F17210 Nicotine dependence, cigarettes, uncomplicated: Secondary | ICD-10-CM | POA: Diagnosis not present

## 2018-08-02 DIAGNOSIS — Z886 Allergy status to analgesic agent status: Secondary | ICD-10-CM | POA: Insufficient documentation

## 2018-08-02 DIAGNOSIS — Z79899 Other long term (current) drug therapy: Secondary | ICD-10-CM | POA: Diagnosis not present

## 2018-08-02 DIAGNOSIS — K219 Gastro-esophageal reflux disease without esophagitis: Secondary | ICD-10-CM | POA: Diagnosis not present

## 2018-08-02 DIAGNOSIS — Z7982 Long term (current) use of aspirin: Secondary | ICD-10-CM | POA: Insufficient documentation

## 2018-08-02 DIAGNOSIS — M24561 Contracture, right knee: Secondary | ICD-10-CM | POA: Insufficient documentation

## 2018-08-02 DIAGNOSIS — Z419 Encounter for procedure for purposes other than remedying health state, unspecified: Secondary | ICD-10-CM

## 2018-08-02 DIAGNOSIS — Z96651 Presence of right artificial knee joint: Secondary | ICD-10-CM | POA: Diagnosis not present

## 2018-08-02 HISTORY — PX: KNEE CLOSED REDUCTION: SHX995

## 2018-08-02 LAB — BASIC METABOLIC PANEL
Anion gap: 13 (ref 5–15)
BUN: 17 mg/dL (ref 6–20)
CHLORIDE: 97 mmol/L — AB (ref 98–111)
CO2: 29 mmol/L (ref 22–32)
CREATININE: 0.74 mg/dL (ref 0.44–1.00)
Calcium: 9.7 mg/dL (ref 8.9–10.3)
GFR calc Af Amer: >60 mL/min (ref >60)
GFR calc non Af Amer: >60 mL/min (ref >60)
GLUCOSE: 91 mg/dL (ref 70–99)
Potassium: 2.8 mmol/L — ABNORMAL LOW (ref 3.5–5.1)
Sodium: 139 mmol/L (ref 135–145)

## 2018-08-02 LAB — CBC
HCT: 41.5 % (ref 36.0–46.0)
Hemoglobin: 13.8 g/dL (ref 12.0–15.0)
MCH: 30.1 pg (ref 26.0–34.0)
MCHC: 33.3 g/dL (ref 30.0–36.0)
MCV: 90.4 fL (ref 78.0–100.0)
Platelets: 182 10*3/uL (ref 150–400)
RBC: 4.59 MIL/uL (ref 3.87–5.11)
RDW: 13.9 % (ref 11.5–15.5)
WBC: 7.7 10*3/uL (ref 4.0–10.5)

## 2018-08-02 SURGERY — MANIPULATION, KNEE, CLOSED
Anesthesia: General | Site: Knee | Laterality: Right

## 2018-08-02 MED ORDER — OXYCODONE-ACETAMINOPHEN 10-325 MG PO TABS: 1.0000 | ORAL_TABLET | Freq: Three times a day (TID) | ORAL | Status: DC | PRN

## 2018-08-02 MED ORDER — MIDAZOLAM HCL 2 MG/2ML IJ SOLN: 1.0000 mg | INTRAMUSCULAR | Status: DC

## 2018-08-02 MED ORDER — LIDOCAINE 2% (20 MG/ML) 5 ML SYRINGE
INTRAMUSCULAR | Status: DC | PRN
  Administered 2018-08-02: 100 mg via INTRAVENOUS

## 2018-08-02 MED ORDER — FENTANYL CITRATE (PF) 100 MCG/2ML IJ SOLN
INTRAMUSCULAR | Status: AC
  Administered 2018-08-02: 50 ug via INTRAVENOUS
  Filled 2018-08-02: qty 2

## 2018-08-02 MED ORDER — ONDANSETRON HCL 4 MG/2ML IJ SOLN: 4.0000 mg | Freq: Once | INTRAMUSCULAR | Status: DC | PRN

## 2018-08-02 MED ORDER — PRAVASTATIN SODIUM 40 MG PO TABS: 40.0000 mg | ORAL_TABLET | Freq: Every day | ORAL | Status: DC

## 2018-08-02 MED ORDER — LORATADINE 10 MG PO TABS: 10.0000 mg | ORAL_TABLET | Freq: Every day | ORAL | Status: DC

## 2018-08-02 MED ORDER — HYDROCHLOROTHIAZIDE 25 MG PO TABS: 25.0000 mg | ORAL_TABLET | Freq: Every day | ORAL | Status: DC

## 2018-08-02 MED ORDER — PROPOFOL 10 MG/ML IV BOLUS
INTRAVENOUS | Status: DC | PRN
  Administered 2018-08-02: 150 mg via INTRAVENOUS

## 2018-08-02 MED ORDER — ONDANSETRON HCL 4 MG/2ML IJ SOLN
INTRAMUSCULAR | Status: AC
  Filled 2018-08-02: qty 2

## 2018-08-02 MED ORDER — LIDOCAINE 2% (20 MG/ML) 5 ML SYRINGE
INTRAMUSCULAR | Status: AC
  Filled 2018-08-02: qty 5

## 2018-08-02 MED ORDER — METHOCARBAMOL 500 MG PO TABS: 500.0000 mg | ORAL_TABLET | Freq: Three times a day (TID) | ORAL | 0 refills | Status: DC | PRN

## 2018-08-02 MED ORDER — MEPERIDINE HCL 50 MG/ML IJ SOLN: 6.2500 mg | INTRAMUSCULAR | Status: DC | PRN

## 2018-08-02 MED ORDER — PROPOFOL 10 MG/ML IV BOLUS
INTRAVENOUS | Status: AC
  Filled 2018-08-02: qty 20

## 2018-08-02 MED ORDER — ACETAMINOPHEN 160 MG/5ML PO SOLN: 325.0000 mg | ORAL | Status: DC | PRN

## 2018-08-02 MED ORDER — MIDAZOLAM HCL 2 MG/2ML IJ SOLN
INTRAMUSCULAR | Status: AC
  Administered 2018-08-02: 2 mg via INTRAVENOUS
  Filled 2018-08-02: qty 2

## 2018-08-02 MED ORDER — SUCCINYLCHOLINE CHLORIDE 200 MG/10ML IV SOSY
PREFILLED_SYRINGE | INTRAVENOUS | Status: AC
  Filled 2018-08-02: qty 10

## 2018-08-02 MED ORDER — KETOROLAC TROMETHAMINE 30 MG/ML IJ SOLN
INTRAMUSCULAR | Status: AC
  Administered 2018-08-02: 30 mg via INTRAVENOUS
  Filled 2018-08-02: qty 1

## 2018-08-02 MED ORDER — ASPIRIN EC 325 MG PO TBEC: 325.0000 mg | DELAYED_RELEASE_TABLET | Freq: Every day | ORAL | 0 refills | Status: DC

## 2018-08-02 MED ORDER — KETOROLAC TROMETHAMINE 30 MG/ML IJ SOLN
30.0000 mg | Freq: Once | INTRAMUSCULAR | Status: AC
  Administered 2018-08-02: 30 mg via INTRAVENOUS

## 2018-08-02 MED ORDER — FENTANYL CITRATE (PF) 100 MCG/2ML IJ SOLN
25.0000 ug | INTRAMUSCULAR | Status: DC | PRN
  Administered 2018-08-02: 50 ug via INTRAVENOUS

## 2018-08-02 MED ORDER — FLUTICASONE PROPIONATE 50 MCG/ACT NA SUSP: 2.0000 | Freq: Two times a day (BID) | NASAL | Status: DC | PRN

## 2018-08-02 MED ORDER — LOSARTAN POTASSIUM 50 MG PO TABS: 50.0000 mg | ORAL_TABLET | Freq: Two times a day (BID) | ORAL | Status: DC

## 2018-08-02 MED ORDER — OXYCODONE-ACETAMINOPHEN 10-325 MG PO TABS: 1.0000 | ORAL_TABLET | Freq: Three times a day (TID) | ORAL | 0 refills | Status: DC | PRN

## 2018-08-02 MED ORDER — ASPIRIN EC 325 MG PO TBEC: 325.0000 mg | DELAYED_RELEASE_TABLET | Freq: Two times a day (BID) | ORAL | Status: DC

## 2018-08-02 MED ORDER — FENTANYL CITRATE (PF) 100 MCG/2ML IJ SOLN
50.0000 ug | Freq: Once | INTRAMUSCULAR | Status: AC
  Administered 2018-08-02: 100 ug via INTRAVENOUS

## 2018-08-02 MED ORDER — FENTANYL CITRATE (PF) 100 MCG/2ML IJ SOLN
INTRAMUSCULAR | Status: AC
  Administered 2018-08-02: 100 ug via INTRAVENOUS
  Filled 2018-08-02: qty 2

## 2018-08-02 MED ORDER — EPHEDRINE SULFATE-NACL 50-0.9 MG/10ML-% IV SOSY
PREFILLED_SYRINGE | INTRAVENOUS | Status: DC | PRN
  Administered 2018-08-02: 10 mg via INTRAVENOUS

## 2018-08-02 MED ORDER — OXYCODONE HCL 5 MG/5ML PO SOLN
5.0000 mg | Freq: Once | ORAL | Status: DC | PRN
  Filled 2018-08-02: qty 5

## 2018-08-02 MED ORDER — MIDAZOLAM HCL 2 MG/2ML IJ SOLN
1.0000 mg | Freq: Once | INTRAMUSCULAR | Status: AC
  Administered 2018-08-02: 2 mg via INTRAVENOUS

## 2018-08-02 MED ORDER — METHOCARBAMOL 500 MG PO TABS: 500.0000 mg | ORAL_TABLET | Freq: Four times a day (QID) | ORAL | Status: DC | PRN

## 2018-08-02 MED ORDER — FENTANYL CITRATE (PF) 100 MCG/2ML IJ SOLN
INTRAMUSCULAR | Status: DC | PRN
  Administered 2018-08-02: 25 ug via INTRAVENOUS

## 2018-08-02 MED ORDER — FENTANYL CITRATE (PF) 100 MCG/2ML IJ SOLN
INTRAMUSCULAR | Status: AC
  Filled 2018-08-02: qty 2

## 2018-08-02 MED ORDER — ONDANSETRON HCL 4 MG/2ML IJ SOLN
INTRAMUSCULAR | Status: DC | PRN
  Administered 2018-08-02: 4 mg via INTRAVENOUS

## 2018-08-02 MED ORDER — ROPIVACAINE HCL 7.5 MG/ML IJ SOLN
INTRAMUSCULAR | Status: DC | PRN
  Administered 2018-08-02: 25 mL via PERINEURAL

## 2018-08-02 MED ORDER — MELATONIN 10 MG PO TABS: 10.0000 mg | ORAL_TABLET | Freq: Every evening | ORAL | Status: DC | PRN

## 2018-08-02 MED ORDER — ACETAMINOPHEN 325 MG PO TABS: 325.0000 mg | ORAL_TABLET | ORAL | Status: DC | PRN

## 2018-08-02 MED ORDER — LACTATED RINGERS IV SOLN
INTRAVENOUS | Status: DC
  Administered 2018-08-02: 09:00:00 via INTRAVENOUS

## 2018-08-02 MED ORDER — FENTANYL CITRATE (PF) 100 MCG/2ML IJ SOLN: 1.0000 ug | Freq: Once | INTRAMUSCULAR | Status: DC

## 2018-08-02 MED ORDER — OXYCODONE HCL 5 MG PO TABS: 5.0000 mg | ORAL_TABLET | Freq: Once | ORAL | Status: DC | PRN

## 2018-08-02 MED ORDER — ESTRADIOL 0.05 MG/24HR TD PTWK: 0.0500 mg | MEDICATED_PATCH | TRANSDERMAL | Status: DC

## 2018-08-02 SURGICAL SUPPLY — 15 items
BANDAGE ADH SHEER 1  50/CT (GAUZE/BANDAGES/DRESSINGS) ×1 IMPLANT
COVER SURGICAL LIGHT HANDLE (MISCELLANEOUS) ×1 IMPLANT
GAUZE SPONGE 4X4 12PLY STRL (GAUZE/BANDAGES/DRESSINGS) ×1 IMPLANT
GLOVE BIOGEL PI IND STRL 8.5 (GLOVE) ×1 IMPLANT
GLOVE BIOGEL PI INDICATOR 8.5 (GLOVE)
GLOVE ECLIPSE 8.0 STRL XLNG CF (GLOVE) ×2 IMPLANT
GOWN STRL REUS W/TWL LRG LVL3 (GOWN DISPOSABLE) ×3 IMPLANT
GOWN STRL REUS W/TWL XL LVL3 (GOWN DISPOSABLE) ×2 IMPLANT
MANIFOLD NEPTUNE II (INSTRUMENTS) ×1 IMPLANT
NDL SAFETY ECLIPSE 18X1.5 (NEEDLE) ×1 IMPLANT
NEEDLE HYPO 18GX1.5 SHARP (NEEDLE)
POSITIONER SURGICAL ARM (MISCELLANEOUS) ×1 IMPLANT
SOL PREP POV-IOD 4OZ 10% (MISCELLANEOUS) ×1 IMPLANT
SYR CONTROL 10ML LL (SYRINGE) ×1 IMPLANT
TOWEL OR 17X26 10 PK STRL BLUE (TOWEL DISPOSABLE) ×2 IMPLANT

## 2018-08-02 NOTE — Op Note (Signed)
NAME: PEGGY, LOGE MEDICAL RECORD RU:[id number] ACCOUNT [id number] DATE OF BIRTH:01-29-1966 FACILITY: [facility] LOCATION: [facility] PHYSICIAN:Kailyn Vanderslice Sanjuana Kava, MD  OPERATIVE REPORT  DATE OF PROCEDURE:  08/02/2018  SURGEON:  Ranee Gosselin, MD  ASSISTANT:  Dimitri Ped, PA.  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  OPERATION:  Closed manipulation of a contracture of the right total knee under general anesthesia and use of the C-arm.  PROCEDURE:  Appropriate time to the top appropriate time out was first carried out also marked the appropriate right thigh in the holding area.  The patient had also had an adductor block prior to surgery.  Under general anesthesia, I did a gentle closed  manipulation with several small attempts at the time of the contracture of the right knee.  The C-arm was brought in to make sure we did not sustain a fracture.  We did not.  At this time, we were able to flex the knee back to about 115 degrees.  We had  her just about out in the neutral extension position.  The manipulation was gentle.  It was done with ease.    There were no complications.  The patient left the operating room in satisfactory condition.  TN/NUANCE  D:08/02/2018 T:08/02/2018 JOB:[id number]

## 2018-08-02 NOTE — Progress Notes (Signed)
AssistedDr. Oddono with right, ultrasound guided, adductor canal block. Side rails up, monitors on throughout procedure. See vital signs in flow sheet. Tolerated Procedure well.  

## 2018-08-02 NOTE — Anesthesia Procedure Notes (Signed)
Procedure Name: LMA Insertion Date/Time: 08/02/2018 11:09 AM Performed by: Doran Clay, CRNA Pre-anesthesia Checklist: Patient identified, Emergency Drugs available, Suction available and Patient being monitored Patient Re-evaluated:Patient Re-evaluated prior to induction Oxygen Delivery Method: Circle system utilized Preoxygenation: Pre-oxygenation with 100% oxygen Induction Type: IV induction LMA: LMA inserted LMA Size: 4.0 Number of attempts: 1 Placement Confirmation: positive ETCO2 and breath sounds checked- equal and bilateral Tube secured with: Tape Dental Injury: Teeth and Oropharynx as per pre-operative assessment

## 2018-08-02 NOTE — H&P (Signed)
Colleen Larsen is an 52 y.o. female.   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  Past Medical History:  Diagnosis Date  . Arthritis    OA BOTH KNEES  . GERD (gastroesophageal reflux disease)   . Hypertension     Past Surgical History:  Procedure Laterality Date  . ABDOMINAL HYSTERECTOMY  YRS AGO   PARTIAL  . ANTERIOR CERVICAL DECOMP/DISCECTOMY FUSION  YRS AGO   WITH CADAVER BONE GRAFT  . BILATERAL CARPAL TUNNLE RELEASE  YRS AGO  . CHOLECYSTECTOMY  AGE 65   OPEN  . ELBOW SURGERY Right YRS AGO  . TOTAL KNEE ARTHROPLASTY Right 05/17/2018   Procedure: RIGHT TOTAL KNEE ARTHROPLASTY, INSERTION OF BONE GRAFT IN FEMORAL CANAL;  Surgeon: Latanya Maudlin, MD;  Location: [facility];  Service: Orthopedics;  Laterality: Right;  . TUBAL LIGATION  YRS AGO    History reviewed. No pertinent family history. Social History:  reports that she has been smoking cigarettes. She has a 30.00 pack-year smoking history. She has never used smokeless tobacco. She reports that she drinks alcohol. She reports that she does not use drugs.  Allergies:  Allergies  Allergen Reactions  . Celebrex [Celecoxib] Hives    Medications Prior to Admission  Medication Sig Dispense Refill  . aspirin EC 325 MG tablet Take 1 tablet (325 mg total) by mouth 2 (two) times daily. (Patient taking differently: Take 325 mg by mouth daily. ) 40 tablet 0  . cetirizine (ZYRTEC) 10 MG tablet Take 10 mg by mouth daily as needed for allergies.     Marland Kitchen estradiol (VIVELLE-DOT) 0.0375 MG/24HR Place 1 patch onto the skin 2 (two) times a week. Tuesdays & Saturdays  5  . hydrochlorothiazide (HYDRODIURIL) 25 MG tablet Take 25 mg by mouth daily.    Marland Kitchen losartan (COZAAR) 50 MG tablet Take 50 mg by mouth 2 (two) times daily.    . Melatonin 10 MG TABS Take 10 mg by mouth at bedtime as needed (for sleep).    . methocarbamol (ROBAXIN) 500 MG tablet Take 1 tablet (500 mg  total) by mouth every 6 (six) hours as needed for muscle spasms. (Patient taking differently: Take 500 mg by mouth 3 (three) times daily as needed for muscle spasms. ) 40 tablet 0  . nabumetone (RELAFEN) 500 MG tablet Take 500 mg by mouth 2 (two) times daily as needed (pain.).     Marland Kitchen oxyCODONE-acetaminophen (PERCOCET) 10-325 MG tablet Take 1 tablet by mouth 3 (three) times daily as needed for pain.  0  . pravastatin (PRAVACHOL) 40 MG tablet Take 40 mg by mouth at bedtime.    . fluticasone (FLONASE) 50 MCG/ACT nasal spray Place 2 sprays into both nostrils 2 (two) times daily as needed for allergies.     Marland Kitchen ibuprofen (ADVIL,MOTRIN) 200 MG tablet Take 600 mg by mouth every 4 (four) hours as needed for headache or moderate pain.      Results for orders placed or performed during the hospital encounter of 08/02/18 (from the past 48 hour(s))  CBC     Status: None   Collection Time: 08/02/18  9:06 AM  Result Value Ref Range   WBC 7.7 4.0 - 10.5 K/uL   RBC 4.59 3.87 - 5.11 MIL/uL   Hemoglobin 13.8 12.0 - 15.0 g/dL   HCT 41.5 36.0 - 46.0 %   MCV 90.4 78.0 - 100.0 fL   MCH 30.1 26.0 - 34.0 pg  MCHC 33.3 30.0 - 36.0 g/dL   RDW 13.9 11.5 - 15.5 %   Platelets 182 150 - 400 K/uL    Comment: Performed at [facility], [place] [address], [place], [place] [address]  Basic metabolic panel     Status: Abnormal   Collection Time: 08/02/18  9:06 AM  Result Value Ref Range   Sodium 139 135 - 145 mmol/L   Potassium 2.8 (L) 3.5 - 5.1 mmol/L   Chloride 97 (L) 98 - 111 mmol/L   CO2 29 22 - 32 mmol/L   Glucose, Bld 91 70 - 99 mg/dL   BUN 17 6 - 20 mg/dL   Creatinine, Ser 0.74 0.44 - 1.00 mg/dL   Calcium 9.7 8.9 - 10.3 mg/dL   GFR calc non Af Amer >60 >60 mL/min   GFR calc Af Amer >60 >60 mL/min    Comment: (NOTE) The eGFR has been calculated using the CKD EPI equation. This calculation has not been validated in all clinical situations. eGFR's persistently <60 mL/min signify possible Chronic  Kidney Disease.    Anion gap 13 5 - 15    Comment: Performed at [facility], [place] [address] [place], [place] [address]   No results found.  Review of Systems  Constitutional: Negative.   HENT: Negative.   Eyes: Negative.   Respiratory: Negative.   Cardiovascular: Negative.   Gastrointestinal: Negative.   Genitourinary: Negative.   Musculoskeletal: Positive for joint pain.  Skin: Negative.   Neurological: Negative.   Endo/Heme/Allergies: Negative.   Psychiatric/Behavioral: Negative.     Blood pressure 129/85, pulse 77, temperature 98.1 F (36.7 C), temperature source Oral, resp. rate 16, height 5' 5"  (1.651 m), weight 78.1 kg, SpO2 97 %. Physical Exam  Constitutional: She appears well-developed.  HENT:  Head: Normocephalic.  Eyes: Pupils are equal, round, and reactive to light.  Neck: Normal range of motion.  Cardiovascular: Normal rate.  Respiratory: Effort normal.  GI: Soft.  Musculoskeletal:  Contracture of Right Total Knee  Neurological: She is alert.  Skin: Skin is warm.  Psychiatric: She has a normal mood and affect.     Assessment/Plan Closed Manipulation of her Right Total Knee under General Anesthesia.  Latanya Maudlin, MD 08/02/2018, 10:25 AM

## 2018-08-02 NOTE — Anesthesia Procedure Notes (Signed)
Anesthesia Regional Block: Adductor canal block   Pre-Anesthetic Checklist: ,, timeout performed, Correct Patient, Correct Site, Correct Laterality, Correct Procedure, Correct Position, site marked, Risks and benefits discussed,  Surgical consent,  Pre-op evaluation,  At surgeon's request and post-op pain management  Laterality: Right  Prep: chloraprep       Needles:  Injection technique: Single-shot  Needle Type: Echogenic Stimulator Needle     Needle Length: 5cm  Needle Gauge: 22     Additional Needles:   Procedures:, nerve stimulator,,, ultrasound used (permanent image in chart),,,,  Narrative:  Start time: 08/02/2018 10:27 AM End time: 08/02/2018 10:30 AM Injection made incrementally with aspirations every 5 mL.  Performed by: Personally  Anesthesiologist: Bethena Midget, MD  Additional Notes: Functioning IV was confirmed and monitors were applied.  A 50mm 22ga Arrow echogenic stimulator needle was used. Sterile prep and drape,hand hygiene and sterile gloves were used. Ultrasound guidance: relevant anatomy identified, needle position confirmed, local anesthetic spread visualized around nerve(s)., vascular puncture avoided.  Image printed for medical record. Negative aspiration and negative test dose prior to incremental administration of local anesthetic. The patient tolerated the procedure well.

## 2018-08-02 NOTE — Discharge Instructions (Addendum)
Weightbearing as tolerated Use CPM as instructed three times a day for two hours each time Call if any complications: [phone] during the day and ask for Dr. Jeannetta Ellis medical assistant, Mackey Birchwood.    General Anesthesia, Adult, Care After These instructions provide you with information about caring for yourself after your procedure. Your health care provider may also give you more specific instructions. Your treatment has been planned according to current medical practices, but problems sometimes occur. Call your health care provider if you have any problems or questions after your procedure. What can I expect after the procedure? After the procedure, it is common to have:  Vomiting.  A sore throat.  Mental slowness.  It is common to feel:  Nauseous.  Cold or shivery.  Sleepy.  Tired.  Sore or achy, even in parts of your body where you did not have surgery.  Follow these instructions at home: For at least 24 hours after the procedure:  Do not: ? Participate in activities where you could fall or become injured. ? Drive. ? Use heavy machinery. ? Drink alcohol. ? Take sleeping pills or medicines that cause drowsiness. ? Make important decisions or sign legal documents. ? Take care of children on your own.  Rest. Eating and drinking  If you vomit, drink water, juice, or soup when you can drink without vomiting.  Drink enough fluid to keep your urine clear or pale yellow.  Make sure you have little or no nausea before eating solid foods.  Follow the diet recommended by your health care provider. General instructions  Have a responsible adult stay with you until you are awake and alert.  Return to your normal activities as told by your health care provider. Ask your health care provider what activities are safe for you.  Take over-the-counter and prescription medicines only as told by your health care provider.  If you smoke, do not smoke without  supervision.  Keep all follow-up visits as told by your health care provider. This is important. Contact a health care provider if:  You continue to have nausea or vomiting at home, and medicines are not helpful.  You cannot drink fluids or start eating again.  You cannot urinate after 8-12 hours.  You develop a skin rash.  You have fever.  You have increasing redness at the site of your procedure. Get help right away if:  You have difficulty breathing.  You have chest pain.  You have unexpected bleeding.  You feel that you are having a life-threatening or urgent problem. This information is not intended to replace advice given to you by your health care provider. Make sure you discuss any questions you have with your health care provider. Document Released: 01/24/2001 Document Revised: 03/22/2016 Document Reviewed: 10/02/2015 Elsevier Interactive Patient Education  [employer].

## 2018-08-02 NOTE — Brief Op Note (Signed)
08/02/2018  11:24 AM  PATIENT:  Colleen Larsen  52 y.o. female  PRE-OPERATIVE DIAGNOSIS:  right knee stiffness; right total knee arthroplasty  POST-OPERATIVE DIAGNOSIS:  right knee stiffness,right total knee arthroplasty  PROCEDURE:  Procedure(s) with comments: CLOSED MANIPULATION RIGHT TOTAL KNEE (Right) -  SURGEON:  Surgeon(s) and Role:    Ranee Gosselin, MD - Primary  PHYSICIAN ASSISTANT: Dimitri Ped PA  ASSISTANTS: Dimitri Ped PA  ANESTHESIA:   general and an Optician, dispensing.  EBL: None  BLOOD ADMINISTERED:none  DRAINS: none   LOCAL MEDICATIONS USED:  NONE  SPECIMEN:  No Specimen  DISPOSITION OF SPECIMEN:  N/A  COUNTS:  YES and NO Not applicable  TOURNIQUET:  * No tourniquets in log *  DICTATION: .Other Dictation: Dictation Number [id number]  PLAN OF CARE: Discharge to home after PACU  PATIENT DISPOSITION:  PACU - hemodynamically stable.   Delay start of Pharmacological VTE agent (>24hrs) due to surgical blood loss or risk of bleeding: not applicable

## 2018-08-02 NOTE — Anesthesia Preprocedure Evaluation (Signed)
Anesthesia Evaluation  Patient identified by MRN, date of birth, ID band Patient awake    Reviewed: Allergy & Precautions, NPO status , Patient's Chart, lab work & pertinent test results  Airway Mallampati: I       Dental no notable dental hx. (+) Teeth Intact   Pulmonary Current Smoker,    Pulmonary exam normal breath sounds clear to auscultation       Cardiovascular hypertension, Pt. on medications Normal cardiovascular exam Rhythm:Regular Rate:Normal     Neuro/Psych    GI/Hepatic Neg liver ROS,   Endo/Other  negative endocrine ROS  Renal/GU negative Renal ROS  negative genitourinary   Musculoskeletal  (+) Arthritis , Osteoarthritis,    Abdominal Normal abdominal exam  (+)   Peds  Hematology negative hematology ROS (+)   Anesthesia Other Findings   Reproductive/Obstetrics                             Anesthesia Physical  Anesthesia Plan  ASA: II  Anesthesia Plan: General   Post-op Pain Management: GA combined w/ Regional for post-op pain   Induction: Intravenous  PONV Risk Score and Plan: 3 and Ondansetron, Dexamethasone, Treatment may vary due to age or medical condition and Midazolam  Airway Management Planned: LMA  Additional Equipment:   Intra-op Plan:   Post-operative Plan:   Informed Consent: I have reviewed the patients History and Physical, chart, labs and discussed the procedure including the risks, benefits and alternatives for the proposed anesthesia with the patient or authorized representative who has indicated his/her understanding and acceptance.     Plan Discussed with: CRNA, Surgeon and Anesthesiologist  Anesthesia Plan Comments: ( )        Anesthesia Quick Evaluation

## 2018-08-02 NOTE — Transfer of Care (Signed)
Immediate Anesthesia Transfer of Care Note  Patient: Colleen Larsen  Procedure(s) Performed: CLOSED MANIPULATION RIGHT TOTAL KNEE (Right Knee)  Patient Location: PACU  Anesthesia Type:General and Regional  Level of Consciousness: sedated  Airway & Oxygen Therapy: Patient Spontanous Breathing  Post-op Assessment: Report given to RN and Post -op Vital signs reviewed and stable  Post vital signs: Reviewed and stable  Last Vitals:  Vitals Value Taken Time  BP    Temp    Pulse    Resp 13 08/02/2018 11:24 AM  SpO2    Vitals shown include unvalidated device data.  Last Pain:  Vitals:   08/02/18 0901  TempSrc:   PainSc: 0-No pain         Complications: No apparent anesthesia complications

## 2018-08-02 NOTE — Anesthesia Postprocedure Evaluation (Signed)
Anesthesia Post Note  Patient: Colleen Larsen  Procedure(s) Performed: CLOSED MANIPULATION RIGHT TOTAL KNEE (Right Knee)     Patient location during evaluation: PACU Anesthesia Type: General Level of consciousness: awake and alert Pain management: pain level controlled Vital Signs Assessment: post-procedure vital signs reviewed and stable Respiratory status: spontaneous breathing, nonlabored ventilation, respiratory function stable and patient connected to nasal cannula oxygen Cardiovascular status: blood pressure returned to baseline and stable Postop Assessment: no apparent nausea or vomiting Anesthetic complications: no    Last Vitals:  Vitals:   08/02/18 1225 08/02/18 1300  BP: 122/79 117/77  Pulse: 82 76  Resp: 17 16  Temp:  36.4 C  SpO2: 95% 94%    Last Pain:  Vitals:   08/02/18 1300  TempSrc:   PainSc: 4                  [name]

## 2018-08-03 ENCOUNTER — Encounter (HOSPITAL_COMMUNITY): Payer: Self-pay | Admitting: Orthopedic Surgery

## 2019-06-05 IMAGING — DX DG KNEE 1-2V*R*
2 series · 2 of 2 positions shown · non-contrast
Comparison: None.

CLINICAL DATA: Right total knee arthroplasty

EXAM:
RIGHT KNEE - 1-2 VIEW

[knee ap]
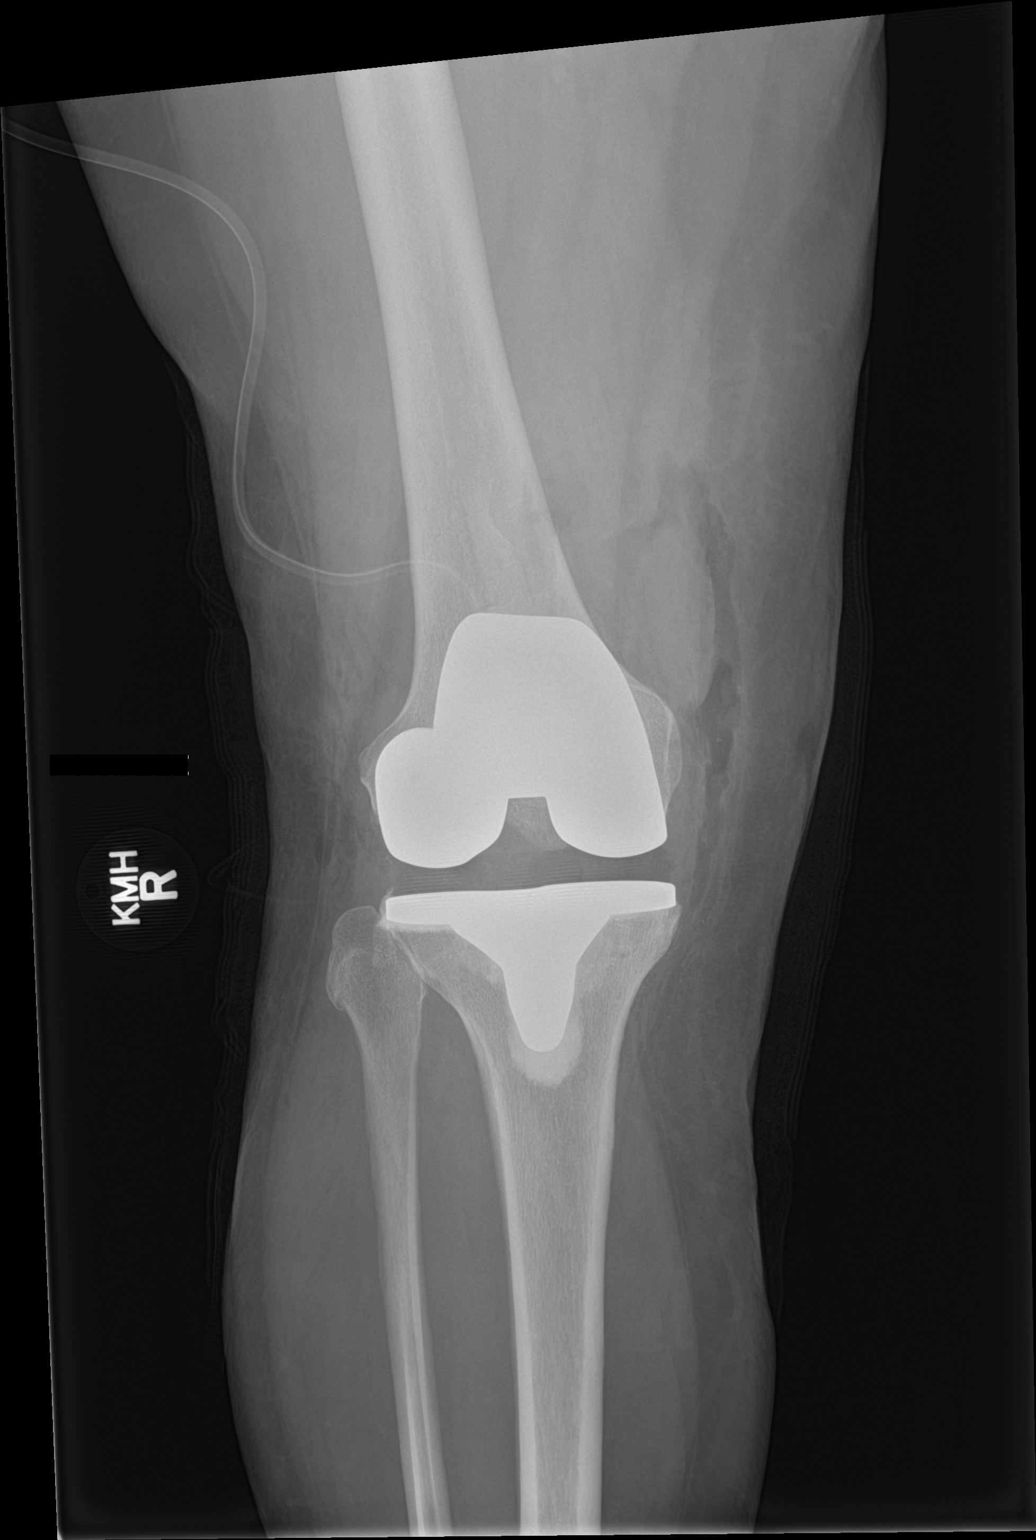

[knee lat]
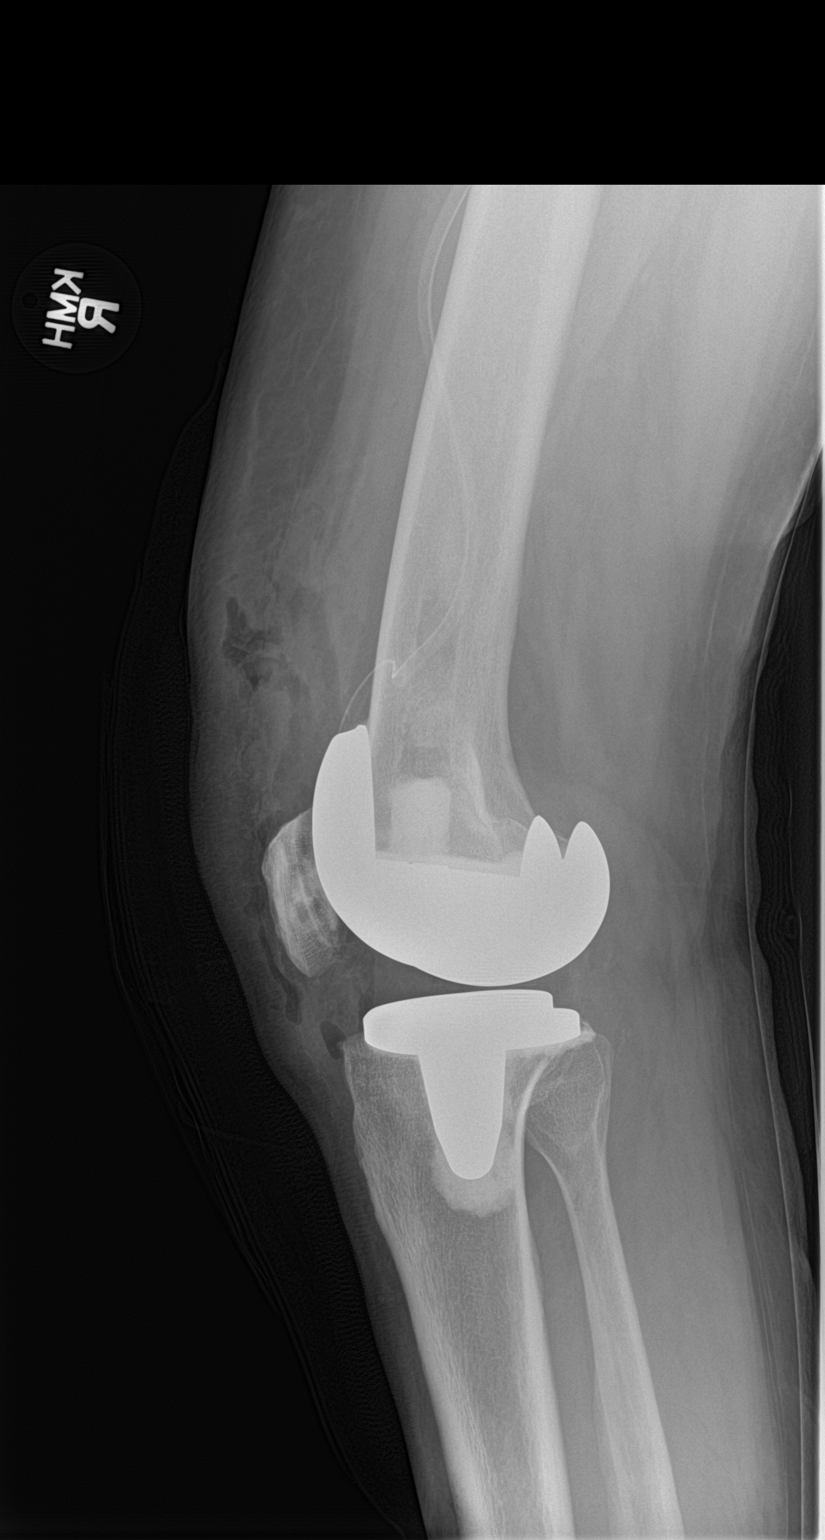

[2 of 2 positions shown; findings below may reference images not displayed]

FINDINGS: Status post right total knee arthroplasty, with well-positioned
right distal femoral and right proximal tibial prostheses. No acute
osseous fracture. No dislocation. No suspicious focal osseous
lesions. Surgical drain terminates over the suprapatellar right knee
joint. Expected gas within and surrounding the right knee joint.
IMPRESSION: Satisfactory immediate postoperative appearance status post right
total knee arthroplasty.

## 2022-04-14 ENCOUNTER — Ambulatory Visit: Payer: 59 | Admitting: Nurse Practitioner

## 2022-04-14 ENCOUNTER — Encounter: Payer: Self-pay | Admitting: Nurse Practitioner

## 2022-04-14 ENCOUNTER — Other Ambulatory Visit: Payer: Self-pay | Admitting: Nurse Practitioner

## 2022-04-14 VITALS — BP 136/74 | HR 82 | Resp 16 | Ht 64.0 in | Wt 172.0 lb

## 2022-04-14 DIAGNOSIS — E782 Mixed hyperlipidemia: Secondary | ICD-10-CM

## 2022-04-14 DIAGNOSIS — I10 Essential (primary) hypertension: Secondary | ICD-10-CM

## 2022-04-14 DIAGNOSIS — K219 Gastro-esophageal reflux disease without esophagitis: Secondary | ICD-10-CM | POA: Diagnosis not present

## 2022-04-14 DIAGNOSIS — Z6829 Body mass index (BMI) 29.0-29.9, adult: Secondary | ICD-10-CM

## 2022-04-14 DIAGNOSIS — J0181 Other acute recurrent sinusitis: Secondary | ICD-10-CM

## 2022-04-14 DIAGNOSIS — F17218 Nicotine dependence, cigarettes, with other nicotine-induced disorders: Secondary | ICD-10-CM

## 2022-04-14 DIAGNOSIS — Z7689 Persons encountering health services in other specified circumstances: Secondary | ICD-10-CM

## 2022-04-14 DIAGNOSIS — E559 Vitamin D deficiency, unspecified: Secondary | ICD-10-CM

## 2022-04-14 DIAGNOSIS — N3946 Mixed incontinence: Secondary | ICD-10-CM | POA: Diagnosis not present

## 2022-04-14 DIAGNOSIS — Z1231 Encounter for screening mammogram for malignant neoplasm of breast: Secondary | ICD-10-CM

## 2022-04-14 MED ORDER — ESTRADIOL 0.0375 MG/24HR TD PTTW: 1.0000 | MEDICATED_PATCH | TRANSDERMAL | 5 refills | Status: DC

## 2022-04-14 MED ORDER — PANTOPRAZOLE SODIUM 40 MG PO TBEC: 40.0000 mg | DELAYED_RELEASE_TABLET | Freq: Every day | ORAL | 3 refills | Status: DC

## 2022-04-14 MED ORDER — HYDROCHLOROTHIAZIDE 25 MG PO TABS: 25.0000 mg | ORAL_TABLET | Freq: Every day | ORAL | 1 refills | Status: DC

## 2022-04-14 MED ORDER — AZITHROMYCIN 250 MG PO TABS: ORAL_TABLET | ORAL | 0 refills | Status: AC

## 2022-04-14 MED ORDER — LOSARTAN POTASSIUM 50 MG PO TABS: 50.0000 mg | ORAL_TABLET | Freq: Two times a day (BID) | ORAL | 1 refills | Status: DC

## 2022-04-14 NOTE — Progress Notes (Signed)
New Patient Office Visit  Subjective    Patient ID: Colleen Larsen, female    DOB: 10-Mar-1966  Age: 56 y.o. MRN: [id number]  CC:  Chief Complaint  Patient presents with   Establish Care   Dizziness    intermittent    HPI Colleen Larsen presents to establish care with a PCP. She has not had a provider in 3-years. Reports history of dyslipidemia, HTN, allergic rhinitis, GERD, and urinary incontinence. She has undergone hysterectomy and right TKA. States she is past due for colonoscopy and mammogram.    Outpatient Encounter Medications as of 04/14/2022  Medication Sig   azithromycin (ZITHROMAX) 250 MG tablet Take 2 tablets on day 1, then 1 tablet daily on days 2 through 5   cetirizine (ZYRTEC) 10 MG tablet Take 10 mg by mouth daily as needed for allergies.    fluticasone (FLONASE) 50 MCG/ACT nasal spray Place 2 sprays into both nostrils 2 (two) times daily as needed for allergies.    ibuprofen (ADVIL,MOTRIN) 200 MG tablet Take 600 mg by mouth every 4 (four) hours as needed for headache or moderate pain.   Melatonin 10 MG TABS Take 10 mg by mouth at bedtime as needed (for sleep).   pantoprazole (PROTONIX) 40 MG tablet Take 1 tablet (40 mg total) by mouth daily.   [DISCONTINUED] estradiol (VIVELLE-DOT) 0.0375 MG/24HR Place 1 patch onto the skin 2 (two) times a week. Tuesdays & Saturdays   [DISCONTINUED] hydrochlorothiazide (HYDRODIURIL) 25 MG tablet Take 25 mg by mouth daily.   [DISCONTINUED] losartan (COZAAR) 50 MG tablet Take 50 mg by mouth 2 (two) times daily.   [DISCONTINUED] aspirin EC 325 MG tablet Take 1 tablet (325 mg total) by mouth daily.   [DISCONTINUED] methocarbamol (ROBAXIN) 500 MG tablet Take 1 tablet (500 mg total) by mouth every 8 (eight) hours as needed for muscle spasms.   [DISCONTINUED] nabumetone (RELAFEN) 500 MG tablet Take 500 mg by mouth 2 (two) times daily as needed (pain.).    [DISCONTINUED] oxyCODONE-acetaminophen (PERCOCET) 10-325 MG tablet Take 1 tablet  by mouth every 8 (eight) hours as needed for pain.   [DISCONTINUED] pravastatin (PRAVACHOL) 40 MG tablet Take 40 mg by mouth at bedtime.   No facility-administered encounter medications on file as of 04/14/2022.    Past Medical History:  Diagnosis Date   Arthritis    OA BOTH KNEES   GERD (gastroesophageal reflux disease)    Hyperlipidemia    Hypertension     Past Surgical History:  Procedure Laterality Date   ABDOMINAL HYSTERECTOMY  YRS AGO   PARTIAL   ANTERIOR CERVICAL DECOMP/DISCECTOMY FUSION  YRS AGO   WITH CADAVER BONE GRAFT   BILATERAL CARPAL TUNNLE RELEASE  YRS AGO   CHOLECYSTECTOMY  AGE 58   OPEN   ELBOW SURGERY Right YRS AGO   KNEE CLOSED REDUCTION Right 08/02/2018   Procedure: CLOSED MANIPULATION RIGHT TOTAL KNEE;  Surgeon: Latanya Maudlin, MD;  Location: [facility];  Service: Orthopedics;  Laterality: Right;  34min   TOTAL KNEE ARTHROPLASTY Right 05/17/2018   Procedure: RIGHT TOTAL KNEE ARTHROPLASTY, INSERTION OF BONE GRAFT IN FEMORAL CANAL;  Surgeon: Latanya Maudlin, MD;  Location: [facility];  Service: Orthopedics;  Laterality: Right;   TUBAL LIGATION  YRS AGO    Family History  Problem Relation Age of Onset   Hypertension Mother    Hyperlipidemia Mother    Hypertension Father    Hyperlipidemia Father    Heart Problems Father    Arthritis Father  COPD Father    Hypertension Brother    Hypertension Paternal Aunt    Heart Problems Paternal Aunt    Hypertension Paternal Aunt    Hyperlipidemia Paternal Aunt    Hypertension Paternal Aunt    Hyperlipidemia Paternal Aunt    Skin cancer Paternal Aunt    Heart Problems Paternal Aunt    Breast cancer Maternal Grandmother    Hypertension Paternal Grandmother    Heart Problems Paternal Grandmother    Hypertension Paternal Grandfather    Stroke Paternal Grandfather    Hyperlipidemia Paternal Grandfather     Social History   Socioeconomic History   Marital status: Married    Spouse name: Not on file   Number of  children: Not on file   Years of education: Not on file   Highest education level: Not on file  Occupational History   Not on file  Tobacco Use   Smoking status: Every Day    Packs/day: 1.00    Years: 30.00    Total pack years: 30.00    Types: Cigarettes   Smokeless tobacco: Never  Vaping Use   Vaping Use: Former  Substance and Sexual Activity   Alcohol use: Yes    Comment: RARE   Drug use: Never   Sexual activity: Not on file  Other Topics Concern   Not on file  Social History Narrative   Not on file   Social Determinants of Health   Financial Resource Strain: Not on file  Food Insecurity: Not on file  Transportation Needs: Not on file  Physical Activity: Not on file  Stress: Not on file  Social Connections: Not on file  Intimate Partner Violence: Not on file    Review of Systems  Constitutional:  Positive for malaise/fatigue.  HENT:  Positive for congestion and sinus pain.   Genitourinary:  Positive for frequency (incontinence).  Endo/Heme/Allergies:  Positive for environmental allergies.        Objective    BP 136/74   Pulse 82   Resp 16   Ht 5\' 4"  (1.626 m)   Wt 172 lb (78 kg)   SpO2 96%   BMI 29.52 kg/m   Physical Exam Vitals reviewed.  Constitutional:      Appearance: Normal appearance.  HENT:     Head: Normocephalic.     Right Ear: Tympanic membrane normal.     Left Ear: Tympanic membrane normal.     Nose: Nose normal.     Mouth/Throat:     Mouth: Mucous membranes are moist.  Eyes:     Comments: Eye glasses in place  Cardiovascular:     Rate and Rhythm: Normal rate and regular rhythm.  Pulmonary:     Effort: Pulmonary effort is normal.     Breath sounds: Normal breath sounds.  Abdominal:     General: Bowel sounds are normal.     Palpations: Abdomen is soft.  Musculoskeletal:        General: Normal range of motion.  Skin:    General: Skin is warm and dry.     Capillary Refill: Capillary refill takes less than 2 seconds.   Neurological:     General: No focal deficit present.     Mental Status: She is alert and oriented to person, place, and time.  Psychiatric:        Mood and Affect: Mood normal.        Behavior: Behavior normal.       Assessment & Plan:   1.  Primary hypertension - CBC with Differential/Platelet; Future - Comprehensive metabolic panel; Future - TSH; Future -continue Losartan as prescribed  2. Gastroesophageal reflux disease, unspecified whether esophagitis present-not at goal - pantoprazole (PROTONIX) 40 MG tablet; Take 1 tablet (40 mg total) by mouth daily.  Dispense: 30 tablet; Refill: 3  3. Mixed hyperlipidemia - Lipid panel; Future  4. Mixed stress and urge urinary incontinence - Ambulatory referral to Urogynecology  5. Vitamin D insufficiency - VITAMIN D 25 Hydroxy (Vit-D Deficiency, Fractures); Future  6. Other acute recurrent sinusitis - azithromycin (ZITHROMAX) 250 MG tablet; Take 2 tablets on day 1, then 1 tablet daily on days 2 through 5  Dispense: 6 tablet; Refill: 0  7. BMI 29.0-29.9,adult - CBC with Differential/Platelet; Future - Comprehensive metabolic panel; Future - Lipid panel; Future - TSH; Future - VITAMIN D 25 Hydroxy (Vit-D Deficiency, Fractures); Future  8. Encounter for screening mammogram for malignant neoplasm of breast - MM Digital Screening; Future  9. Encounter to establish care  10. Nicotine dependence, current cigarette smoking.    Return on Monday, June 19th, 2023, fasting Follow-up in 45-months, fasting  Begin Protonix 40 mg for GERD Avoid foods that trigger GERD We will call you with lab results and mammogram appt (July 31st, 2023) and urogynecology for urinary incontinence  Follow-up: fasting labs on 04/19/22; chronic fasting follow-up 76-months, pending lab results   I, Rip Harbour, NP, have reviewed all documentation for this visit. The documentation on 04/14/22 for the exam, diagnosis, procedures, and orders are all  accurate and complete.   Signed, Rip Harbour, NP

## 2022-04-14 NOTE — Patient Instructions (Addendum)
Return on Monday, June 19th, 2023, fasting Follow-up in 62-month, fasting  Begin Protonix 40 mg for GERD Avoid foods that trigger GERD We will call you with lab results and mammogram appt (July 31st, 2023) and urogynecology for urinary incontinence   Urinary Incontinence Urinary incontinence refers to a condition in which a person is unable to control where and when to pass urine. A person with this condition will urinate involuntarily. This means that the person urinates when he or she does not mean to. What are the causes? This condition may be caused by: Medicines. Infections. Constipation. Overactive bladder muscles. Weak bladder muscles. Weak pelvic floor muscles. These muscles provide support for the bladder, intestine, and, in women, the uterus. Enlarged prostate in men. The prostate is a gland near the bladder. When it gets too big, it can pinch the urethra. With the urethra blocked, the bladder can weaken and lose the ability to empty properly. Surgery. Emotional factors, such as anxiety, stress, or post-traumatic stress disorder (PTSD). Spinal cord injury, nerve injury, or other neurological conditions. Pelvic organ prolapse. This happens in women when organs move out of place and into the vagina. This movement can prevent the bladder and urethra from working properly. What increases the risk? The following factors may make you more likely to develop this condition: Age. The older you are, the higher the risk. Obesity. Being physically inactive. Pregnancy and childbirth. Menopause. Diseases that affect the nerves or spinal cord. Long-term, or chronic, coughing. This can increase pressure on the bladder and pelvic floor muscles. What are the signs or symptoms? Symptoms may vary depending on the type of urinary incontinence you have. They include: A sudden urge to urinate, and passing urine involuntarily before you can get to a bathroom (urge incontinence). Suddenly passing  urine when doing activities that force urine to pass, such as coughing, laughing, exercising, or sneezing (stress incontinence). Needing to urinate often but urinating only a small amount, or constantly dribbling urine (overflow incontinence). Urinating because you cannot get to the bathroom in time due to a physical disability, such as arthritis or injury, or due to a communication or thinking problem, such as Alzheimer's disease (functional incontinence). How is this diagnosed? This condition may be diagnosed based on: Your medical history. A physical exam. Tests, such as: Urine tests. X-rays of your kidney and bladder. Ultrasound. CT scan. Cystoscopy. In this procedure, a health care provider inserts a tube with a light and camera (cystoscope) through the urethra and into the bladder to check for problems. Urodynamic testing. These tests assess how well the bladder, urethra, and sphincter can store and release urine. There are different types of urodynamic tests, and they vary depending on what the test is measuring. To help diagnose your condition, your health care provider may recommend that you keep a log of when you urinate and how much you urinate. How is this treated? Treatment for this condition depends on the type of incontinence that you have and its cause. Treatment may include: Lifestyle changes, such as: Quitting smoking. Maintaining a healthy weight. Staying active. Try to get 150 minutes of moderate-intensity exercise every week. Ask your health care provider which activities are safe for you. Eating a healthy diet. Avoid high-fat foods, like fried foods. Avoid refined carbohydrates like white bread and white rice. Limit how much alcohol and caffeine you drink. Increase your fiber intake. Healthy sources of fiber include beans, whole grains, and fresh fruits and vegetables. Behavioral changes, such as: Pelvic floor muscle exercises.  Bladder training, such as lengthening  the amount of time between bathroom breaks, or using the bathroom at regular intervals. Using techniques to suppress bladder urges. This can include distraction techniques or controlled breathing exercises. Medicines, such as: Medicines to relax the bladder muscles and prevent bladder spasms. Medicines to help slow or prevent the growth of a man's prostate. Botox injections. These can help relax the bladder muscles. Treatments, such as: Using pulses of electricity to help change bladder reflexes (electrical nerve stimulation). For women, using a medical device to prevent urine leaks. This is a small, tampon-like, disposable device that is inserted into the urethra. Injecting collagen or carbon beads (bulking agents) into the urinary sphincter. These can help thicken tissue and close the bladder opening. Surgery. Follow these instructions at home: Lifestyle Limit alcohol and caffeine. These can fill your bladder quickly and irritate it. Keep yourself clean to help prevent odors and skin damage. Ask your health care provider about special skin creams and cleansers that can protect the skin from urine. Consider wearing pads or adult diapers. Make sure to change them regularly, and always change them right after experiencing incontinence. General instructions Take over-the-counter and prescription medicines only as told by your health care provider. Use the bathroom about every 3-4 hours, even if you do not feel the need to urinate. Try to empty your bladder completely every time. After urinating, wait a minute. Then try to urinate again. Make sure you are in a relaxed position while urinating. If your incontinence is caused by nerve problems, keep a log of the medicines you take and the times you go to the bathroom. Keep all follow-up visits. This is important. Where to find more information [employer] Diabetes and Digestive and Kidney Diseases: [employer] American Urology  Association: www.urologyhealth.org Contact a health care provider if: You have pain that gets worse. Your incontinence gets worse. Get help right away if: You have a fever or chills. You are unable to urinate. You have redness in your groin area or down your legs. Summary Urinary incontinence refers to a condition in which a person is unable to control where and when to pass urine. This condition may be caused by medicines, infection, weak bladder muscles, weak pelvic floor muscles, enlargement of the prostate (in men), or surgery. Factors such as older age, obesity, pregnancy and childbirth, menopause, neurological diseases, and chronic coughing may increase your risk for developing this condition. Types of urinary incontinence include urge incontinence, stress incontinence, overflow incontinence, and functional incontinence. This condition is usually treated first with lifestyle and behavioral changes, such as quitting smoking, eating a healthier diet, and doing regular pelvic floor exercises. Other treatment options include medicines, bulking agents, medical devices, electrical nerve stimulation, or surgery. This information is not intended to replace advice given to you by your health care provider. Make sure you discuss any questions you have with your health care provider. Document Revised: 05/23/2020 Document Reviewed: 05/23/2020 Elsevier Patient Education  [place] for Gastroesophageal Reflux Disease, Adult When you have gastroesophageal reflux disease (GERD), the foods you eat and your eating habits are very important. Choosing the right foods can help ease your discomfort. Think about working with a food expert (dietitian) to help you make good choices. What are tips for following this plan? Reading food labels Look for foods that are low in saturated fat. Foods that may help with your symptoms include: Foods that have less than 5% of daily value (DV)  of  fat. Foods that have 0 grams of trans fat. Cooking Do not fry your food. Cook your food by baking, steaming, grilling, or broiling. These are all methods that do not need a lot of fat for cooking. To add flavor, try to use herbs that are low in spice and acidity. Meal planning  Choose healthy foods that are low in fat, such as: Fruits and vegetables. Whole grains. Low-fat dairy products. Lean meats, fish, and poultry. Eat small meals often instead of eating 3 large meals each day. Eat your meals slowly in a place where you are relaxed. Avoid bending over or lying down until 2-3 hours after eating. Limit high-fat foods such as fatty meats or fried foods. Limit your intake of fatty foods, such as oils, butter, and shortening. Avoid the following as told by your doctor: Foods that cause symptoms. These may be different for different people. Keep a food diary to keep track of foods that cause symptoms. Alcohol. Drinking a lot of liquid with meals. Eating meals during the 2-3 hours before bed. Lifestyle Stay at a healthy weight. Ask your doctor what weight is healthy for you. If you need to lose weight, work with your doctor to do so safely. Exercise for at least 30 minutes on 5 or more days each week, or as told by your doctor. Wear loose-fitting clothes. Do not smoke or use any products that contain nicotine or tobacco. If you need help quitting, ask your doctor. Sleep with the head of your bed higher than your feet. Use a wedge under the mattress or blocks under the bed frame to raise the head of the bed. Chew sugar-free gum after meals. What foods should eat?  Eat a healthy, well-balanced diet of fruits, vegetables, whole grains, low-fat dairy products, lean meats, fish, and poultry. Each person is different. Foods that may cause symptoms in one person may not cause any symptoms in another person. Work with your doctor to find foods that are safe for you. The items listed above may not  be a complete list of what you can eat and drink. Contact a food expert for more options. What foods should I avoid? Limiting some of these foods may help in managing the symptoms of GERD. Everyone is different. Talk with a food expert or your doctor to help you find the exact foods to avoid, if any. Fruits Any fruits prepared with added fat. Any fruits that cause symptoms. For some people, this may include citrus fruits, such as oranges, grapefruit, pineapple, and lemons. Vegetables Deep-fried vegetables. [place] fries. Any vegetables prepared with added fat. Any vegetables that cause symptoms. For some people, this may include tomatoes and tomato products, chili peppers, onions and garlic, and horseradish. Grains Pastries or quick breads with added fat. Meats and other proteins High-fat meats, such as fatty beef or pork, hot dogs, ribs, ham, sausage, salami, and bacon. Fried meat or protein, including fried fish and fried chicken. Nuts and nut butters, in large amounts. Dairy Whole milk and chocolate milk. Sour cream. Cream. Ice cream. Cream cheese. Milkshakes. Fats and oils Butter. Margarine. Shortening. Ghee. Beverages Coffee and tea, with or without caffeine. Carbonated beverages. Sodas. Energy drinks. Fruit juice made with acidic fruits, such as orange or grapefruit. Tomato juice. Alcoholic drinks. Sweets and desserts Chocolate and cocoa. Donuts. Seasonings and condiments Pepper. Peppermint and spearmint. Added salt. Any condiments, herbs, or seasonings that cause symptoms. For some people, this may include curry, hot sauce, or vinegar-based salad dressings.  The items listed above may not be a complete list of what you should not eat and drink. Contact a food expert for more options. Questions to ask your doctor Diet and lifestyle changes are often the first steps that are taken to manage symptoms of GERD. If diet and lifestyle changes do not help, talk with your doctor about taking  medicines. Where to find more information [employer] Foundation for Gastrointestinal Disorders: aboutgerd.org Summary When you have GERD, food and lifestyle choices are very important in easing your symptoms. Eat small meals often instead of 3 large meals a day. Eat your meals slowly and in a place where you are relaxed. Avoid bending over or lying down until 2-3 hours after eating. Limit high-fat foods such as fatty meats or fried foods. This information is not intended to replace advice given to you by your health care provider. Make sure you discuss any questions you have with your health care provider. Document Revised: 04/28/2020 Document Reviewed: 04/28/2020 Elsevier Patient Education  2023 Elsevier Inc.   Preventive Care 58-75 Years Old, Female Preventive care refers to lifestyle choices and visits with your health care provider that can promote health and wellness. Preventive care visits are also called wellness exams. What can I expect for my preventive care visit? Counseling Your health care provider may ask you questions about your: Medical history, including: Past medical problems. Family medical history. Pregnancy history. Current health, including: Menstrual cycle. Method of birth control. Emotional well-being. Home life and relationship well-being. Sexual activity and sexual health. Lifestyle, including: Alcohol, nicotine or tobacco, and drug use. Access to firearms. Diet, exercise, and sleep habits. Work and work Statistician. Sunscreen use. Safety issues such as seatbelt and bike helmet use. Physical exam Your health care provider will check your: Height and weight. These may be used to calculate your BMI (body mass index). BMI is a measurement that tells if you are at a healthy weight. Waist circumference. This measures the distance around your waistline. This measurement also tells if you are at a healthy weight and may help predict your risk of certain  diseases, such as type 2 diabetes and high blood pressure. Heart rate and blood pressure. Body temperature. Skin for abnormal spots. What immunizations do I need?  Vaccines are usually given at various ages, according to a schedule. Your health care provider will recommend vaccines for you based on your age, medical history, and lifestyle or other factors, such as travel or where you work. What tests do I need? Screening Your health care provider may recommend screening tests for certain conditions. This may include: Lipid and cholesterol levels. Diabetes screening. This is done by checking your blood sugar (glucose) after you have not eaten for a while (fasting). Pelvic exam and Pap test. Hepatitis B test. Hepatitis C test. HIV (human immunodeficiency virus) test. STI (sexually transmitted infection) testing, if you are at risk. Lung cancer screening. Colorectal cancer screening. Mammogram. Talk with your health care provider about when you should start having regular mammograms. This may depend on whether you have a family history of breast cancer. BRCA-related cancer screening. This may be done if you have a family history of breast, ovarian, tubal, or peritoneal cancers. Bone density scan. This is done to screen for osteoporosis. Talk with your health care provider about your test results, treatment options, and if necessary, the need for more tests. Follow these instructions at home: Eating and drinking  Eat a diet that includes fresh fruits and vegetables, whole grains, lean  protein, and low-fat dairy products. Take vitamin and mineral supplements as recommended by your health care provider. Do not drink alcohol if: Your health care provider tells you not to drink. You are pregnant, may be pregnant, or are planning to become pregnant. If you drink alcohol: Limit how much you have to 0-1 drink a day. Know how much alcohol is in your drink. In the [place] one drink equals one 12 oz  bottle of beer (355 mL), one 5 oz glass of wine (148 mL), or one 1 oz glass of hard liquor (44 mL). Lifestyle Brush your teeth every morning and night with fluoride toothpaste. Floss one time each day. Exercise for at least 30 minutes 5 or more days each week. Do not use any products that contain nicotine or tobacco. These products include cigarettes, chewing tobacco, and vaping devices, such as e-cigarettes. If you need help quitting, ask your health care provider. Do not use drugs. If you are sexually active, practice safe sex. Use a condom or other form of protection to prevent STIs. If you do not wish to become pregnant, use a form of birth control. If you plan to become pregnant, see your health care provider for a prepregnancy visit. Take aspirin only as told by your health care provider. Make sure that you understand how much to take and what form to take. Work with your health care provider to find out whether it is safe and beneficial for you to take aspirin daily. Find healthy ways to manage stress, such as: Meditation, yoga, or listening to music. Journaling. Talking to a trusted person. Spending time with friends and family. Minimize exposure to UV radiation to reduce your risk of skin cancer. Safety Always wear your seat belt while driving or riding in a vehicle. Do not drive: If you have been drinking alcohol. Do not ride with someone who has been drinking. When you are tired or distracted. While texting. If you have been using any mind-altering substances or drugs. Wear a helmet and other protective equipment during sports activities. If you have firearms in your house, make sure you follow all gun safety procedures. Seek help if you have been physically or sexually abused. What's next? Visit your health care provider once a year for an annual wellness visit. Ask your health care provider how often you should have your eyes and teeth checked. Stay up to date on all  vaccines. This information is not intended to replace advice given to you by your health care provider. Make sure you discuss any questions you have with your health care provider. Document Revised: 04/15/2021 Document Reviewed: 04/15/2021 Elsevier Patient Education  [place].

## 2022-04-19 ENCOUNTER — Other Ambulatory Visit: Payer: 59

## 2022-04-19 DIAGNOSIS — E559 Vitamin D deficiency, unspecified: Secondary | ICD-10-CM

## 2022-04-19 DIAGNOSIS — Z6829 Body mass index (BMI) 29.0-29.9, adult: Secondary | ICD-10-CM

## 2022-04-19 DIAGNOSIS — I10 Essential (primary) hypertension: Secondary | ICD-10-CM

## 2022-04-19 DIAGNOSIS — E782 Mixed hyperlipidemia: Secondary | ICD-10-CM

## 2022-04-20 LAB — LIPID PANEL
Chol/HDL Ratio: 5 ratio — ABNORMAL HIGH (ref 0.0–4.4)
Cholesterol, Total: 230 mg/dL — ABNORMAL HIGH (ref 100–199)
HDL: 46 mg/dL (ref >39)
LDL Chol Calc (NIH): 149 mg/dL — ABNORMAL HIGH (ref 0–99)
Triglycerides: 194 mg/dL — ABNORMAL HIGH (ref 0–149)
VLDL Cholesterol Cal: 35 mg/dL (ref 5–40)

## 2022-04-20 LAB — CBC WITH DIFFERENTIAL/PLATELET
Basophils Absolute: 0 10*3/uL (ref 0.0–0.2)
Basos: 1 %
EOS (ABSOLUTE): 0.2 10*3/uL (ref 0.0–0.4)
Eos: 3 %
Hematocrit: 41.4 % (ref 34.0–46.6)
Hemoglobin: 14.3 g/dL (ref 11.1–15.9)
Immature Grans (Abs): 0 10*3/uL (ref 0.0–0.1)
Immature Granulocytes: 0 %
Lymphocytes Absolute: 2.1 10*3/uL (ref 0.7–3.1)
Lymphs: 27 %
MCH: 31.1 pg (ref 26.6–33.0)
MCHC: 34.5 g/dL (ref 31.5–35.7)
MCV: 90 fL (ref 79–97)
Monocytes Absolute: 0.5 10*3/uL (ref 0.1–0.9)
Monocytes: 6 %
Neutrophils Absolute: 4.9 10*3/uL (ref 1.4–7.0)
Neutrophils: 63 %
Platelets: 199 10*3/uL (ref 150–450)
RBC: 4.6 x10E6/uL (ref 3.77–5.28)
RDW: 13.9 % (ref 11.7–15.4)
WBC: 7.7 10*3/uL (ref 3.4–10.8)

## 2022-04-20 LAB — COMPREHENSIVE METABOLIC PANEL
ALT: 16 IU/L (ref 0–32)
AST: 17 IU/L (ref 0–40)
Albumin/Globulin Ratio: 1.8 (ref 1.2–2.2)
Albumin: 4.3 g/dL (ref 3.8–4.9)
Alkaline Phosphatase: 95 IU/L (ref 44–121)
BUN/Creatinine Ratio: 23 (ref 9–23)
BUN: 15 mg/dL (ref 6–24)
Bilirubin Total: 0.3 mg/dL (ref 0.0–1.2)
CO2: 28 mmol/L (ref 20–29)
Calcium: 9.7 mg/dL (ref 8.7–10.2)
Chloride: 98 mmol/L (ref 96–106)
Creatinine, Ser: 0.66 mg/dL (ref 0.57–1.00)
Globulin, Total: 2.4 g/dL (ref 1.5–4.5)
Glucose: 90 mg/dL (ref 70–99)
Potassium: 3.5 mmol/L (ref 3.5–5.2)
Sodium: 140 mmol/L (ref 134–144)
Total Protein: 6.7 g/dL (ref 6.0–8.5)
eGFR: 102 mL/min/{1.73_m2} (ref >59)

## 2022-04-20 LAB — VITAMIN D 25 HYDROXY (VIT D DEFICIENCY, FRACTURES): Vit D, 25-Hydroxy: 28.1 ng/mL — ABNORMAL LOW (ref 30.0–100.0)

## 2022-04-20 LAB — TSH: TSH: 1.63 u[IU]/mL (ref 0.450–4.500)

## 2022-04-20 LAB — CARDIOVASCULAR RISK ASSESSMENT

## 2022-04-22 ENCOUNTER — Other Ambulatory Visit: Payer: Self-pay

## 2022-04-22 MED ORDER — VITAMIN D (ERGOCALCIFEROL) 1.25 MG (50000 UNIT) PO CAPS: 50000.0000 [IU] | ORAL_CAPSULE | ORAL | 1 refills | Status: DC

## 2022-04-22 MED ORDER — ROSUVASTATIN CALCIUM 5 MG PO TABS: 5.0000 mg | ORAL_TABLET | Freq: Every day | ORAL | 1 refills | Status: DC

## 2022-06-02 ENCOUNTER — Inpatient Hospital Stay: Admission: RE | Admit: 2022-06-02 | Payer: 59 | Source: Ambulatory Visit

## 2022-06-23 ENCOUNTER — Telehealth: Payer: Self-pay | Admitting: Nurse Practitioner

## 2022-06-23 NOTE — Telephone Encounter (Signed)
   Colleen Larsen has been scheduled for the following appointment:  WHAT: SCREENING MAMMOGRAM WHERE: [facility] DATE: 07/21/22 TIME: 12:50 PM CHECK-IN  Patient has been made aware.

## 2022-07-05 ENCOUNTER — Other Ambulatory Visit: Payer: Self-pay | Admitting: Nurse Practitioner

## 2022-07-21 LAB — HM MAMMOGRAPHY

## 2022-07-27 ENCOUNTER — Ambulatory Visit: Payer: 59 | Admitting: Nurse Practitioner

## 2022-07-27 ENCOUNTER — Encounter: Payer: Self-pay | Admitting: Nurse Practitioner

## 2022-07-27 VITALS — BP 136/86 | HR 81 | Temp 97.2°F | Ht 65.0 in | Wt 183.0 lb

## 2022-07-27 DIAGNOSIS — I10 Essential (primary) hypertension: Secondary | ICD-10-CM | POA: Diagnosis not present

## 2022-07-27 DIAGNOSIS — Z23 Encounter for immunization: Secondary | ICD-10-CM

## 2022-07-27 DIAGNOSIS — K219 Gastro-esophageal reflux disease without esophagitis: Secondary | ICD-10-CM

## 2022-07-27 DIAGNOSIS — E7849 Other hyperlipidemia: Secondary | ICD-10-CM | POA: Diagnosis not present

## 2022-07-27 DIAGNOSIS — F411 Generalized anxiety disorder: Secondary | ICD-10-CM

## 2022-07-27 DIAGNOSIS — Z780 Asymptomatic menopausal state: Secondary | ICD-10-CM

## 2022-07-27 DIAGNOSIS — E559 Vitamin D deficiency, unspecified: Secondary | ICD-10-CM | POA: Diagnosis not present

## 2022-07-27 MED ORDER — VITAMIN D (ERGOCALCIFEROL) 1.25 MG (50000 UNIT) PO CAPS: 50000.0000 [IU] | ORAL_CAPSULE | ORAL | 1 refills | Status: DC

## 2022-07-27 MED ORDER — ESCITALOPRAM OXALATE 10 MG PO TABS: 10.0000 mg | ORAL_TABLET | Freq: Every day | ORAL | 0 refills | Status: DC

## 2022-07-27 MED ORDER — LOSARTAN POTASSIUM 50 MG PO TABS: ORAL_TABLET | ORAL | 1 refills | Status: DC

## 2022-07-27 MED ORDER — ROSUVASTATIN CALCIUM 5 MG PO TABS: 5.0000 mg | ORAL_TABLET | Freq: Every day | ORAL | 1 refills | Status: DC

## 2022-07-27 MED ORDER — HYDROCHLOROTHIAZIDE 25 MG PO TABS: 25.0000 mg | ORAL_TABLET | Freq: Every day | ORAL | 1 refills | Status: DC

## 2022-07-27 MED ORDER — ESTRADIOL 0.5 MG PO TABS: 0.5000 mg | ORAL_TABLET | Freq: Every day | ORAL | 1 refills | Status: DC

## 2022-07-27 MED ORDER — PANTOPRAZOLE SODIUM 40 MG PO TBEC: 40.0000 mg | DELAYED_RELEASE_TABLET | Freq: Every day | ORAL | 1 refills | Status: DC

## 2022-07-27 NOTE — Patient Instructions (Addendum)
We will call you with lab results Continue medications Begin Lexapro 10 mg daily Stop medication immediately for any adverse side effects Follow-up in 4-weeks   Preventing Vitamin D Deficiency Vitamin D deficiency is when your body does not have enough vitamin D. Vitamin D is important because it helps your body maintain calcium and phosphorus levels. It plays a key role in the health of bones and teeth, reduces inflammation, and improves the body's defense system (immune system). Our bodies make vitamin D when our skin is exposed to direct sunlight. However, for many people, this may not be enough vitamin D to meet the body's needs. How can this condition affect me? If vitamin D deficiency is severe, it can cause a condition in which a person's bones become softer than normal. In adults, this condition is called osteomalacia. In children, this condition is called rickets. Vitamin D deficiency can also cause weak or thin bones (osteoporosis) in adults. What can increase my risk? You may be at risk for a vitamin D deficiency if you: Are pregnant. Are obese. Are an older adult. Have dark skin. Take certain medicines that affect the way vitamin D is absorbed. Have had a surgery in which a part of the stomach or a part of the small intestine was removed. Other risk factors include: Having a condition that limits your ability to absorb fat, such as Crohn's disease, long-term (chronic) pancreatitis, or cystic fibrosis. Having certain conditions that are passed from parent to child (inherited). Not having access to foods rich in vitamin D. Having limited ability to move and go outside safely. Living in areas that have fewer hours of sunlight. Spending most of your day indoors, or covering your skin all the time when you are outdoors. What actions can I take to reduce my risk of a vitamin D deficiency? Knowing the best sources of vitamin D You can meet your daily vitamin D needs  from: Foods. Dietary supplements. Direct exposure to natural sunlight. Infant formula, for infants. Knowing how much vitamin D you need General recommendations for daily vitamin D intake vary by these categories: Infants: 400 international units (IU). Children older than 1 year: 600 international units. Adults: 600 international units. Pregnant and breastfeeding women: 600 international units. Adults older than 70 years: 800 international units. These are minimum levels of recommended amounts. Your health care provider may recommend a different amount of vitamin D intake based on your specific needs and your overall health. Getting sun exposure Get regular, safe exposure to natural sunlight. Expose your skin to direct sunlight for at least 15 minutes every day. If you have dark skin, you may need to expose your skin for a longer period of time. Protect your skin from too much sun exposure. This helps to prevent skin cancer. Ask your health care provider if regular sun exposure is safe for you. Do not use a tanning bed. Eating and drinking  Eat foods that naturally contain vitamin D. These include: Beef liver. Eggs. The vitamin D is in the yolk. Fish, such as salmon or trout. Mushrooms that were treated with UV light. Eat or drink products that have vitamin D added to them (are fortified). These may include: Cereals. Milk, including plant-based alternatives such as almond, soy, or oat milks. Orange juice. Margarine. When choosing foods, check the food label on the package to see: How much vitamin D is in the item. If the food is fortified with vitamin D. The items listed above may not be a complete  list of foods and beverages you can eat and drink. Contact a dietitian for more information. Taking supplements and medicines If you are at risk for vitamin D deficiency, or if you have certain diseases, your health care provider may recommend that you take a vitamin D supplement. Make  sure you: Talk with your health care provider before you start taking any vitamin D supplements. You may be more sensitive to the side effects of vitamin D supplements if you are on certain medicines or have certain medical conditions. Tell your health care provider about all medicines you are taking, including vitamins, herbs, eye drops, creams, and over-the-counter medicines. Take over-the-counter and prescription medicines only as told by your health care provider. Take supplements only as told by your health care provider. To increase absorption of your supplement, take it with a meal or snack. Summary Vitamin D plays a key role in the health of bones and teeth, reduces inflammation, and improves the body's defense system (immune system). A vitamin D deficiency can put you at risk of developing conditions such as rickets or osteoporosis. Our bodies make vitamin D when our skin is exposed to direct sunlight. However, for many people, this may not be enough vitamin D to meet the body's needs. Some foods naturally contain vitamin D, including beef liver, egg yolk, and fish. Eat or drink products that have vitamin D added to them (are fortified). This information is not intended to replace advice given to you by your health care provider. Make sure you discuss any questions you have with your health care provider. Document Revised: 07/24/2021 Document Reviewed: 07/24/2021 Elsevier Patient Education  2023 [employer]  Managing Anxiety, Adult After being diagnosed with anxiety, you may be relieved to know why you have felt or behaved a certain way. You may also feel overwhelmed about the treatment ahead and what it will mean for your life. With care and support, you can manage this condition. How to manage lifestyle changes Managing stress and anxiety  Stress is your body's reaction to life changes and events, both good and bad. Most stress will last just a few hours, but stress can be ongoing  and can lead to more than just stress. Although stress can play a major role in anxiety, it is not the same as anxiety. Stress is usually caused by something external, such as a deadline, test, or competition. Stress normally passes after the triggering event has ended.  Anxiety is caused by something internal, such as imagining a terrible outcome or worrying that something will go wrong that will devastate you. Anxiety often does not go away even after the triggering event is over, and it can become long-term (chronic) worry. It is important to understand the differences between stress and anxiety and to manage your stress effectively so that it does not lead to an anxious response. Talk with your health care provider or a counselor to learn more about reducing anxiety and stress. He or she may suggest tension reduction techniques, such as: Music therapy. Spend time creating or listening to music that you enjoy and that inspires you. Mindfulness-based meditation. Practice being aware of your normal breaths while not trying to control your breathing. It can be done while sitting or walking. Centering prayer. This involves focusing on a word, phrase, or sacred image that means something to you and brings you peace. Deep breathing. To do this, expand your stomach and inhale slowly through your nose. Hold your breath for 3-5 seconds. Then exhale  slowly, letting your stomach muscles relax. Self-talk. Learn to notice and identify thought patterns that lead to anxiety reactions and change those patterns to thoughts that feel peaceful. Muscle relaxation. Taking time to tense muscles and then relax them. Choose a tension reduction technique that fits your lifestyle and personality. These techniques take time and practice. Set aside 5-15 minutes a day to do them. Therapists can offer counseling and training in these techniques. The training to help with anxiety may be covered by some insurance plans. Other things  you can do to manage stress and anxiety include: Keeping a stress diary. This can help you learn what triggers your reaction and then learn ways to manage your response. Thinking about how you react to certain situations. You may not be able to control everything, but you can control your response. Making time for activities that help you relax and not feeling guilty about spending your time in this way. Doing visual imagery. This involves imagining or creating mental pictures to help you relax. Practicing yoga. Through yoga poses, you can lower tension and promote relaxation.  Medicines Medicines can help ease symptoms. Medicines for anxiety include: Antidepressant medicines. These are usually prescribed for long-term daily control. Anti-anxiety medicines. These may be added in severe cases, especially when panic attacks occur. Medicines will be prescribed by a health care provider. When used together, medicines, psychotherapy, and tension reduction techniques may be the most effective treatment. Relationships Relationships can play a big part in helping you recover. Try to spend more time connecting with trusted friends and family members. Consider going to couples counseling if you have a partner, taking family education classes, or going to family therapy. Therapy can help you and others better understand your condition. How to recognize changes in your anxiety Everyone responds differently to treatment for anxiety. Recovery from anxiety happens when symptoms decrease and stop interfering with your daily activities at home or work. This may mean that you will start to: Have better concentration and focus. Worry will interfere less in your daily thinking. Sleep better. Be less irritable. Have more energy. Have improved memory. It is also important to recognize when your condition is getting worse. Contact your health care provider if your symptoms interfere with home or work and you feel  like your condition is not improving. Follow these instructions at home: Activity Exercise. Adults should do the following: Exercise for at least 150 minutes each week. The exercise should increase your heart rate and make you sweat (moderate-intensity exercise). Strengthening exercises at least twice a week. Get the right amount and quality of sleep. Most adults need 7-9 hours of sleep each night. Lifestyle  Eat a healthy diet that includes plenty of vegetables, fruits, whole grains, low-fat dairy products, and lean protein. Do not eat a lot of foods that are high in fats, added sugars, or salt (sodium). Make choices that simplify your life. Do not use any products that contain nicotine or tobacco. These products include cigarettes, chewing tobacco, and vaping devices, such as e-cigarettes. If you need help quitting, ask your health care provider. Avoid caffeine, alcohol, and certain over-the-counter cold medicines. These may make you feel worse. Ask your pharmacist which medicines to avoid. General instructions Take over-the-counter and prescription medicines only as told by your health care provider. Keep all follow-up visits. This is important. Where to find support You can get help and support from these sources: Self-help groups. Online and [employer]. A trusted spiritual leader. Couples counseling. Family education classes.  Family therapy. Where to find more information You may find that joining a support group helps you deal with your anxiety. The following sources can help you locate counselors or support groups near you: Mental Health [place]: www.mentalhealthamerica.net Anxiety and [employer] of [place] (ADAA): [employer] Mental [employer]ness (NAMI): www.nami.org Contact a health care provider if: You have a hard time staying focused or finishing daily tasks. You spend many hours a day feeling worried about everyday life. You  become exhausted by worry. You start to have headaches or frequently feel tense. You develop chronic nausea or diarrhea. Get help right away if: You have a racing heart and shortness of breath. You have thoughts of hurting yourself or others. If you ever feel like you may hurt yourself or others, or have thoughts about taking your own life, get help right away. Go to your nearest emergency department or: Call your local emergency services (911 in the [place] Call a suicide crisis helpline, such as the National Suicide Prevention Lifeline at (443) 535-1112 or 988 in the [place] This is open 24 hours a day in the [place] Text the Crisis Text Line at 914-452-5111 (in the [place] Summary Taking steps to learn and use tension reduction techniques can help calm you and help prevent triggering an anxiety reaction. When used together, medicines, psychotherapy, and tension reduction techniques may be the most effective treatment. Family, friends, and partners can play a big part in supporting you. This information is not intended to replace advice given to you by your health care provider. Make sure you discuss any questions you have with your health care provider. Document Revised: 05/13/2021 Document Reviewed: 02/08/2021 Elsevier Patient Education  2023 [employer] Inc[employer] Menopause Menopause is the normal time of a woman's life when menstrual periods stop completely. It marks the natural end to a woman's ability to become pregnant. It can be defined as the absence of a menstrual period for 12 months without another medical cause. The transition to menopause (perimenopause) most often happens between the ages of 28 and 21, and can last for many years. During perimenopause, hormone levels change in your body, which can cause symptoms and affect your health. Menopause may increase your risk for: Weakened bones (osteoporosis), which causes fractures. Depression. Hardening and narrowing of the arteries (atherosclerosis),  which can cause heart attacks and strokes. What are the causes? This condition is usually caused by a natural change in hormone levels that happens as you get older. The condition may also be caused by changes that are not natural, including: Surgery to remove both ovaries (surgical menopause). Side effects from some medicines, such as chemotherapy used to treat cancer (chemical menopause). What increases the risk? This condition is more likely to start at an earlier age if you have certain medical conditions or have undergone treatments, including: A tumor of the pituitary gland in the brain. A disease that affects the ovaries and hormones. Certain cancer treatments, such as chemotherapy or hormone therapy, or radiation therapy on the pelvis. Heavy smoking and excessive alcohol use. Family history of early menopause. This condition is also more likely to develop earlier in women who are very thin. What are the signs or symptoms? Symptoms of this condition include: Hot flashes. Irregular menstrual periods. Night sweats. Changes in feelings about sex. This could be a decrease in sex drive or an increased discomfort around your sexuality. Vaginal dryness and thinning of the vaginal walls. This may cause painful sex. Dryness of the skin and development of  wrinkles. Headaches. Problems sleeping (insomnia). Mood swings or irritability. Memory problems. Weight gain. Hair growth on the face and chest. Bladder infections or problems with urinating. How is this diagnosed? This condition is diagnosed based on your medical history, a physical exam, your age, your menstrual history, and your symptoms. Hormone tests may also be done. How is this treated? In some cases, no treatment is needed. You and your health care provider should make a decision together about whether treatment is necessary. Treatment will be based on your individual condition and preferences. Treatment for this condition  focuses on managing symptoms. Treatment may include: Menopausal hormone therapy (MHT). Medicines to treat specific symptoms or complications. Acupuncture. Vitamin or herbal supplements. Before starting treatment, make sure to let your health care provider know if you have a personal or family history of these conditions: Heart disease. Breast cancer. Blood clots. Diabetes. Osteoporosis. Follow these instructions at home: Lifestyle Do not use any products that contain nicotine or tobacco, such as cigarettes, e-cigarettes, and chewing tobacco. If you need help quitting, ask your health care provider. Get at least 30 minutes of physical activity on 5 or more days each week. Avoid alcoholic and caffeinated beverages, as well as spicy foods. This may help prevent hot flashes. Get 7-8 hours of sleep each night. If you have hot flashes, try: Dressing in layers. Avoiding things that may trigger hot flashes, such as spicy food, warm places, or stress. Taking slow, deep breaths when a hot flash starts. Keeping a fan in your home and office. Find ways to manage stress, such as deep breathing, meditation, or journaling. Consider going to group therapy with other women who are having menopause symptoms. Ask your health care provider about recommended group therapy meetings. Eating and drinking  Eat a healthy, balanced diet that contains whole grains, lean protein, low-fat dairy, and plenty of fruits and vegetables. Your health care provider may recommend adding more soy to your diet. Foods that contain soy include tofu, tempeh, and soy milk. Eat plenty of foods that contain calcium and vitamin D for bone health. Items that are rich in calcium include low-fat milk, yogurt, beans, almonds, sardines, broccoli, and kale. Medicines Take over-the-counter and prescription medicines only as told by your health care provider. Talk with your health care provider before starting any herbal supplements. If  prescribed, take vitamins and supplements as told by your health care provider. General instructions  Keep track of your menstrual periods, including: When they occur. How heavy they are and how long they last. How much time passes between periods. Keep track of your symptoms, noting when they start, how often you have them, and how long they last. Use vaginal lubricants or moisturizers to help with vaginal dryness and improve comfort during sex. Keep all follow-up visits. This is important. This includes any group therapy or counseling. Contact a health care provider if: You are still having menstrual periods after age 2. You have pain during sex. You have not had a period for 12 months and you develop vaginal bleeding. Get help right away if you have: Severe depression. Excessive vaginal bleeding. Pain when you urinate. A fast or irregular heartbeat (palpitations). Severe headaches. Abdominal pain or severe indigestion. Summary Menopause is a normal time of life when menstrual periods stop completely. It is usually defined as the absence of a menstrual period for 12 months without another medical cause. The transition to menopause (perimenopause) most often happens between the ages of 24 and 71 and can last  for several years. Symptoms can be managed through medicines, lifestyle changes, and complementary therapies such as acupuncture. Eat a balanced diet that is rich in nutrients to promote bone health and heart health and to manage symptoms during menopause. This information is not intended to replace advice given to you by your health care provider. Make sure you discuss any questions you have with your health care provider. Document Revised: 07/18/2020 Document Reviewed: 04/03/2020 Elsevier Patient Education  2023 [employer].

## 2022-07-27 NOTE — Progress Notes (Signed)
Subjective:  Patient ID: Colleen Larsen, female    DOB: June 09, 1966  Age: 56 y.o. MRN: [id number]  CC: Vitamin D deficiency Hyperlipidemia  HPI  Pt presents for follow-up of hyperlipidemia and Vit D deficiency. She tells me she has experienced increased anxiety symptoms over the past year. States symptoms are affecting intimacy with her spouse. States she feels "claustrophobic" when in the missionary position. Pt denies previous anxiety/depression therapy or pharmaceutical management. Pt also lost her 29 year old brother to an MI unexpectedly a few months ago. States she has also experienced depression since the loss of her brother.    Lipid/Cholesterol, Follow-up  Last lipid panel Other pertinent labs  Lab Results  Component Value Date   CHOL 230 (H) 04/19/2022   HDL 46 04/19/2022   LDLCALC 149 (H) 04/19/2022   TRIG 194 (H) 04/19/2022   CHOLHDL 5.0 (H) 04/19/2022   Lab Results  Component Value Date   ALT 16 04/19/2022   AST 17 04/19/2022   PLT 199 04/19/2022   TSH 1.630 04/19/2022     She was last seen for this 3 months ago.  Management includes Crestor 5 mg . She reports good compliance with treatment. She is not having side effects.  Current diet: well balanced Current exercise: none  The 10-year ASCVD risk score (Arnett DK, et al., 2019) is: 11.5%   Hypertension, follow-up:  She was last seen for hypertension 3 months ago.  BP at that visit was 136/74. Management includes HCTZ and Cozaar.  She reports good compliance with treatment. She is not having side effects.  She is following a Regular diet. She is not exercising. She does smoke. Use of agents associated with hypertension: NSAIDS.  Outside blood pressures are not being checked.  Pertinent labs: Lab Results  Component Value Date   CHOL 230 (H) 04/19/2022   HDL 46 04/19/2022   LDLCALC 149 (H) 04/19/2022   TRIG 194 (H) 04/19/2022   CHOLHDL 5.0 (H) 04/19/2022   Lab Results  Component Value Date    NA 140 04/19/2022   K 3.5 04/19/2022   CREATININE 0.66 04/19/2022   EGFR 102 04/19/2022   GFRNONAA >60 08/02/2018   GLUCOSE 90 04/19/2022     The 10-year ASCVD risk score (Arnett DK, et al., 2019) is: 11.5%   Vitamin D deficiency, follow-up  Lab Results  Component Value Date   VD25OH 28.1 (L) 04/19/2022   CALCIUM 9.7 04/19/2022   CALCIUM 9.7 08/02/2018   Wt Readings from Last 3 Encounters:  04/14/22 172 lb (78 kg)  08/02/18 172 lb 3.2 oz (78.1 kg)  05/17/18 182 lb (82.6 kg)    She was last seen for vitamin D deficiency 3 months ago.  Management since that visit includes Vitamin D 50, 000 U weekly, Vit D rich diet. She reports good compliance with treatment. She is not having side effects.   GERD, Follow up:  The patient was last seen for GERD 3 months ago. Current treatment consist SP:QZRAQTMA 40 mg She reports excellent compliance with treatment. She is not having side effects. She is NOT experiencing belching and eructation, choking on food, or difficulty swallowing  -----------------------------------------------------------------------------------------  Current Outpatient Medications on File Prior to Visit  Medication Sig Dispense Refill   cetirizine (ZYRTEC) 10 MG tablet Take 10 mg by mouth daily as needed for allergies.      fluticasone (FLONASE) 50 MCG/ACT nasal spray Place 2 sprays into both nostrils 2 (two) times daily as needed for allergies.  hydrochlorothiazide (HYDRODIURIL) 25 MG tablet Take 1 tablet (25 mg total) by mouth daily. 90 tablet 1   ibuprofen (ADVIL,MOTRIN) 200 MG tablet Take 600 mg by mouth every 4 (four) hours as needed for headache or moderate pain.     losartan (COZAAR) 50 MG tablet TAKE 1 TABLET(50 MG) BY MOUTH TWICE DAILY 90 tablet 1   Melatonin 10 MG TABS Take 10 mg by mouth at bedtime as needed (for sleep).     pantoprazole (PROTONIX) 40 MG tablet Take 1 tablet (40 mg total) by mouth daily. 30 tablet 3   rosuvastatin (CRESTOR) 5  MG tablet Take 1 tablet (5 mg total) by mouth daily. 90 tablet 1   Vitamin D, Ergocalciferol, (DRISDOL) 1.25 MG (50000 UNIT) CAPS capsule Take 1 capsule (50,000 Units total) by mouth every 7 (seven) days. 12 capsule 1   [DISCONTINUED] estradiol (VIVELLE-DOT) 0.0375 MG/24HR Place 1 patch onto the skin 2 (two) times a week. Tuesdays & Saturdays 8 patch 5   No current facility-administered medications on file prior to visit.   Past Medical History:  Diagnosis Date   Arthritis    OA BOTH KNEES   GERD (gastroesophageal reflux disease)    Hyperlipidemia    Hypertension    Past Surgical History:  Procedure Laterality Date   ABDOMINAL HYSTERECTOMY  YRS AGO   PARTIAL   ANTERIOR CERVICAL DECOMP/DISCECTOMY FUSION  YRS AGO   WITH CADAVER BONE GRAFT   BILATERAL CARPAL TUNNLE RELEASE  YRS AGO   CHOLECYSTECTOMY  AGE 61   OPEN   ELBOW SURGERY Right YRS AGO   KNEE CLOSED REDUCTION Right 08/02/2018   Procedure: CLOSED MANIPULATION RIGHT TOTAL KNEE;  Surgeon: Latanya Maudlin, MD;  Location: [facility];  Service: Orthopedics;  Laterality: Right;  52min   TOTAL KNEE ARTHROPLASTY Right 05/17/2018   Procedure: RIGHT TOTAL KNEE ARTHROPLASTY, INSERTION OF BONE GRAFT IN FEMORAL CANAL;  Surgeon: Latanya Maudlin, MD;  Location: [facility];  Service: Orthopedics;  Laterality: Right;   TUBAL LIGATION  YRS AGO    Family History  Problem Relation Age of Onset   Cancer Mother    Hypertension Mother    Hyperlipidemia Mother    Rectal cancer Mother    Hypertension Father    Hyperlipidemia Father    Heart Problems Father    Arthritis Father    COPD Father    Hypertension Brother    Hypertension Paternal Aunt    Heart Problems Paternal Aunt    Hypertension Paternal Aunt    Hyperlipidemia Paternal Aunt    Hypertension Paternal Aunt    Hyperlipidemia Paternal Aunt    Skin cancer Paternal Aunt    Heart Problems Paternal Aunt    Breast cancer Maternal Grandmother    Hypertension Paternal Grandmother    Heart  Problems Paternal Grandmother    Hypertension Paternal Grandfather    Stroke Paternal Grandfather    Hyperlipidemia Paternal Grandfather    Social History   Socioeconomic History   Marital status: Married    Spouse name: Not on file   Number of children: Not on file   Years of education: Not on file   Highest education level: Not on file  Occupational History   Not on file  Tobacco Use   Smoking status: Every Day    Packs/day: 1.00    Years: 30.00    Total pack years: 30.00    Types: Cigarettes   Smokeless tobacco: Never  Vaping Use   Vaping Use: Former  Substance and Sexual  Activity   Alcohol use: Yes    Comment: RARE   Drug use: Never   Sexual activity: Not on file  Other Topics Concern   Not on file  Social History Narrative   Not on file   Social Determinants of Health   Financial Resource Strain: Not on file  Food Insecurity: Not on file  Transportation Needs: Not on file  Physical Activity: Not on file  Stress: Not on file  Social Connections: Not on file    Review of Systems  Constitutional:  Negative for chills, fatigue and fever.  HENT:  Negative for congestion, ear pain, rhinorrhea and sore throat.   Respiratory:  Negative for cough and shortness of breath.   Cardiovascular:  Negative for chest pain.  Gastrointestinal:  Negative for abdominal pain, constipation, diarrhea, nausea and vomiting.  Genitourinary:  Negative for dysuria and urgency.  Musculoskeletal:  Negative for back pain and myalgias.  Neurological:  Negative for dizziness, weakness, light-headedness and headaches.  Psychiatric/Behavioral:  Negative for dysphoric mood. The patient is not nervous/anxious.      Objective:  BP 136/86   Pulse 81   Temp (!) 97.2 F (36.2 C)   Ht _0  (1.651 m)   Wt 183 lb (83 kg)   SpO2 96%   BMI 30.45 kg/m       04/14/2022    2:43 PM 08/02/2018    1:00 PM 08/02/2018   12:25 PM  BP/Weight  Systolic BP 093 235 573  Diastolic BP 74 77 79  Wt.  (Lbs) 172    BMI 29.52 kg/m2      Physical Exam Vitals reviewed.  Constitutional:      Appearance: Normal appearance.  HENT:     Head: Normocephalic.     Right Ear: Tympanic membrane normal.     Left Ear: Tympanic membrane normal.  Eyes:     Pupils: Pupils are equal, round, and reactive to light.  Cardiovascular:     Rate and Rhythm: Normal rate and regular rhythm.     Pulses: Normal pulses.     Heart sounds: Normal heart sounds.  Pulmonary:     Effort: Pulmonary effort is normal.     Breath sounds: Normal breath sounds.  Abdominal:     General: Bowel sounds are normal.     Palpations: Abdomen is soft.  Skin:    General: Skin is warm and dry.     Capillary Refill: Capillary refill takes less than 2 seconds.  Neurological:     General: No focal deficit present.     Mental Status: She is alert.  Psychiatric:        Mood and Affect: Mood normal.        Behavior: Behavior normal.    Lab Results  Component Value Date   WBC 7.7 04/19/2022   HGB 14.3 04/19/2022   HCT 41.4 04/19/2022   PLT 199 04/19/2022   GLUCOSE 90 04/19/2022   CHOL 230 (H) 04/19/2022   TRIG 194 (H) 04/19/2022   HDL 46 04/19/2022   LDLCALC 149 (H) 04/19/2022   ALT 16 04/19/2022   AST 17 04/19/2022   NA 140 04/19/2022   K 3.5 04/19/2022   CL 98 04/19/2022   CREATININE 0.66 04/19/2022   BUN 15 04/19/2022   CO2 28 04/19/2022   TSH 1.630 04/19/2022   INR 0.87 05/10/2018      Assessment & Plan:   1. Other hyperlipidemia-not at goal - CBC with Differential/Platelet - Comprehensive metabolic panel -  Lipid panel - rosuvastatin (CRESTOR) 5 MG tablet; Take 1 tablet (5 mg total) by mouth daily.  Dispense: 90 tablet; Refill: 1  2. Gastroesophageal reflux disease, unspecified whether esophagitis present-not at goal - CBC with Differential/Platelet - Comprehensive metabolic panel - pantoprazole (PROTONIX) 40 MG tablet; Take 1 tablet (40 mg total) by mouth daily.  Dispense: 90 tablet; Refill: 1  3.  Vitamin D deficiency-not at goal - VITAMIN D 25 Hydroxy (Vit-D Deficiency, Fractures) - Vitamin D, Ergocalciferol, (DRISDOL) 1.25 MG (50000 UNIT) CAPS capsule; Take 1 capsule (50,000 Units total) by mouth every 7 (seven) days.  Dispense: 12 capsule; Refill: 1  4. Primary hypertension-well controlled - hydrochlorothiazide (HYDRODIURIL) 25 MG tablet; Take 1 tablet (25 mg total) by mouth daily.  Dispense: 90 tablet; Refill: 1 - losartan (COZAAR) 50 MG tablet; TAKE 1 TABLET(50 MG) BY MOUTH TWICE DAILY  Dispense: 90 tablet; Refill: 1  5. Postmenopausal estrogen deficiency - estradiol (ESTRACE) 0.5 MG tablet; Take 1 tablet (0.5 mg total) by mouth daily.  Dispense: 90 tablet; Refill: 1  6. GAD (generalized anxiety disorder) - CBC with Differential/Platelet - Comprehensive metabolic panel - escitalopram (LEXAPRO) 10 MG tablet; Take 1 tablet (10 mg total) by mouth daily.  Dispense: 30 tablet; Refill: 0  7. Encounter for immunization - Flu Vaccine MDCK QUAD PF     We will call you with lab results Continue medications Begin Lexapro 10 mg daily Stop medication immediately for any adverse side effects Follow-up in 4-weeks  Follow-up: 4-weeks  An After Visit Summary was printed and given to the patient.  I, Rip Harbour, NP, have reviewed all documentation for this visit. The documentation on 07/27/22 for the exam, diagnosis, procedures, and orders are all accurate and complete.    Signed, Rip Harbour, NP [facility] [phone]

## 2022-07-28 LAB — COMPREHENSIVE METABOLIC PANEL
ALT: 18 IU/L (ref 0–32)
AST: 20 IU/L (ref 0–40)
Albumin/Globulin Ratio: 1.9 (ref 1.2–2.2)
Albumin: 4.4 g/dL (ref 3.8–4.9)
Alkaline Phosphatase: 93 IU/L (ref 44–121)
BUN/Creatinine Ratio: 20 (ref 9–23)
BUN: 13 mg/dL (ref 6–24)
Bilirubin Total: 0.4 mg/dL (ref 0.0–1.2)
CO2: 24 mmol/L (ref 20–29)
Calcium: 9.5 mg/dL (ref 8.7–10.2)
Chloride: 101 mmol/L (ref 96–106)
Creatinine, Ser: 0.64 mg/dL (ref 0.57–1.00)
Globulin, Total: 2.3 g/dL (ref 1.5–4.5)
Glucose: 88 mg/dL (ref 70–99)
Potassium: 3.4 mmol/L — ABNORMAL LOW (ref 3.5–5.2)
Sodium: 140 mmol/L (ref 134–144)
Total Protein: 6.7 g/dL (ref 6.0–8.5)
eGFR: 102 mL/min/{1.73_m2} (ref >59)

## 2022-07-28 LAB — VITAMIN D 25 HYDROXY (VIT D DEFICIENCY, FRACTURES): Vit D, 25-Hydroxy: 55.1 ng/mL (ref 30.0–100.0)

## 2022-07-28 LAB — CBC WITH DIFFERENTIAL/PLATELET
Basophils Absolute: 0 10*3/uL (ref 0.0–0.2)
Basos: 0 %
EOS (ABSOLUTE): 0.1 10*3/uL (ref 0.0–0.4)
Eos: 2 %
Hematocrit: 41.1 % (ref 34.0–46.6)
Hemoglobin: 13.7 g/dL (ref 11.1–15.9)
Immature Grans (Abs): 0 10*3/uL (ref 0.0–0.1)
Immature Granulocytes: 1 %
Lymphocytes Absolute: 1.9 10*3/uL (ref 0.7–3.1)
Lymphs: 29 %
MCH: 30.6 pg (ref 26.6–33.0)
MCHC: 33.3 g/dL (ref 31.5–35.7)
MCV: 92 fL (ref 79–97)
Monocytes Absolute: 0.4 10*3/uL (ref 0.1–0.9)
Monocytes: 6 %
Neutrophils Absolute: 4 10*3/uL (ref 1.4–7.0)
Neutrophils: 62 %
Platelets: 188 10*3/uL (ref 150–450)
RBC: 4.47 x10E6/uL (ref 3.77–5.28)
RDW: 14.1 % (ref 11.7–15.4)
WBC: 6.4 10*3/uL (ref 3.4–10.8)

## 2022-07-28 LAB — LIPID PANEL
Chol/HDL Ratio: 4.1 ratio (ref 0.0–4.4)
Cholesterol, Total: 181 mg/dL (ref 100–199)
HDL: 44 mg/dL (ref >39)
LDL Chol Calc (NIH): 102 mg/dL — ABNORMAL HIGH (ref 0–99)
Triglycerides: 200 mg/dL — ABNORMAL HIGH (ref 0–149)
VLDL Cholesterol Cal: 35 mg/dL (ref 5–40)

## 2022-07-28 LAB — CARDIOVASCULAR RISK ASSESSMENT

## 2022-08-10 ENCOUNTER — Other Ambulatory Visit: Payer: Self-pay

## 2022-08-10 DIAGNOSIS — Z1231 Encounter for screening mammogram for malignant neoplasm of breast: Secondary | ICD-10-CM

## 2022-08-19 ENCOUNTER — Encounter: Payer: Self-pay | Admitting: Nurse Practitioner

## 2022-08-19 ENCOUNTER — Other Ambulatory Visit: Payer: Self-pay | Admitting: Nurse Practitioner

## 2022-08-19 ENCOUNTER — Ambulatory Visit: Payer: 59 | Admitting: Nurse Practitioner

## 2022-08-19 VITALS — BP 140/80 | HR 75 | Temp 97.3°F | Ht 65.0 in | Wt 182.0 lb

## 2022-08-19 DIAGNOSIS — Z87898 Personal history of other specified conditions: Secondary | ICD-10-CM | POA: Diagnosis not present

## 2022-08-19 DIAGNOSIS — R051 Acute cough: Secondary | ICD-10-CM

## 2022-08-19 DIAGNOSIS — J018 Other acute sinusitis: Secondary | ICD-10-CM

## 2022-08-19 DIAGNOSIS — F419 Anxiety disorder, unspecified: Secondary | ICD-10-CM

## 2022-08-19 DIAGNOSIS — F411 Generalized anxiety disorder: Secondary | ICD-10-CM

## 2022-08-19 MED ORDER — PROMETHAZINE-DM 6.25-15 MG/5ML PO SYRP: 5.0000 mL | ORAL_SOLUTION | Freq: Four times a day (QID) | ORAL | 0 refills | Status: DC | PRN

## 2022-08-19 MED ORDER — ESCITALOPRAM OXALATE 10 MG PO TABS: 10.0000 mg | ORAL_TABLET | Freq: Every day | ORAL | 2 refills | Status: DC

## 2022-08-19 MED ORDER — AZELASTINE HCL 0.1 % NA SOLN: 1.0000 | Freq: Two times a day (BID) | NASAL | 12 refills | Status: DC

## 2022-08-19 MED ORDER — ALPRAZOLAM 0.25 MG PO TABS: 0.2500 mg | ORAL_TABLET | Freq: Two times a day (BID) | ORAL | 0 refills | Status: DC | PRN

## 2022-08-19 MED ORDER — AZITHROMYCIN 250 MG PO TABS: ORAL_TABLET | ORAL | 0 refills | Status: AC

## 2022-08-19 MED ORDER — SCOPOLAMINE 1 MG/3DAYS TD PT72: 1.0000 | MEDICATED_PATCH | TRANSDERMAL | 0 refills | Status: DC | PRN

## 2022-08-19 NOTE — Progress Notes (Signed)
Subjective:  Patient ID: Colleen Larsen, female    DOB: 18-Jul-1966  Age: 56 y.o. MRN: [id number]  Chief Complaint  Patient presents with   Anxiety    HPI Patient was seen on 07/27/2022 for GAD. She started Lexapro 10 mg daily. She stated that she feels better states that its not completely better but improving. She is traveling out of town, flying to [place] and boarding a cruise ship for the first time. She is anxious about traveling and possible motion sickness.   Colleen Larsen reports sinus congestion/pressure, bilateral ear fullness/popping, post-nasal-drip with cough for several days. Treatment has included Flonase nasal spray.  Anxiety, Follow-up  She was last seen for anxiety 4 weeks ago. Current treatment includes Lexapro 10 mg.   She reports good compliance with treatment. She reports good tolerance of treatment. She is not having side effects.   She feels her anxiety is mild and Improved since last visit.   GAD-7 Results    07/27/2022    8:07 AM  GAD-7 Generalized Anxiety Disorder Screening Tool  1. Feeling Nervous, Anxious, or on Edge 3  2. Not Being Able to Stop or Control Worrying 3  3. Worrying Too Much About Different Things 3  4. Trouble Relaxing 1  5. Being So Restless it's Hard To Sit Still 0  6. Becoming Easily Annoyed or Irritable 1  7. Feeling Afraid As If Something Awful Might Happen 1  Total GAD-7 Score 12  Difficulty At Work, Home, or Getting  Along With Others? Somewhat difficult    PHQ-9 Scores    08/19/2022    8:29 AM 07/27/2022    8:24 AM 07/27/2022    8:07 AM  PHQ9 SCORE ONLY  PHQ-9 Total Score 4 7 0     Current Outpatient Medications on File Prior to Visit  Medication Sig Dispense Refill   cetirizine (ZYRTEC) 10 MG tablet Take 10 mg by mouth daily as needed for allergies.      escitalopram (LEXAPRO) 10 MG tablet Take 1 tablet (10 mg total) by mouth daily. 30 tablet 0   estradiol (ESTRACE) 0.5 MG tablet Take 1 tablet (0.5 mg total) by mouth  daily. 90 tablet 1   fluticasone (FLONASE) 50 MCG/ACT nasal spray Place 2 sprays into both nostrils 2 (two) times daily as needed for allergies.      hydrochlorothiazide (HYDRODIURIL) 25 MG tablet Take 1 tablet (25 mg total) by mouth daily. 90 tablet 1   ibuprofen (ADVIL,MOTRIN) 200 MG tablet Take 600 mg by mouth every 4 (four) hours as needed for headache or moderate pain.     losartan (COZAAR) 50 MG tablet TAKE 1 TABLET(50 MG) BY MOUTH TWICE DAILY 90 tablet 1   Melatonin 10 MG TABS Take 10 mg by mouth at bedtime as needed (for sleep).     pantoprazole (PROTONIX) 40 MG tablet Take 1 tablet (40 mg total) by mouth daily. 90 tablet 1   rosuvastatin (CRESTOR) 5 MG tablet Take 1 tablet (5 mg total) by mouth daily. 90 tablet 1   Vitamin D, Ergocalciferol, (DRISDOL) 1.25 MG (50000 UNIT) CAPS capsule Take 1 capsule (50,000 Units total) by mouth every 7 (seven) days. 12 capsule 1   No current facility-administered medications on file prior to visit.   Past Medical History:  Diagnosis Date   Arthritis    OA BOTH KNEES   GERD (gastroesophageal reflux disease)    Hyperlipidemia    Hypertension    Past Surgical History:  Procedure Laterality Date  ABDOMINAL HYSTERECTOMY  YRS AGO   PARTIAL   ANTERIOR CERVICAL DECOMP/DISCECTOMY FUSION  YRS AGO   WITH CADAVER BONE GRAFT   BILATERAL CARPAL TUNNLE RELEASE  YRS AGO   CHOLECYSTECTOMY  AGE 73   OPEN   ELBOW SURGERY Right YRS AGO   KNEE CLOSED REDUCTION Right 08/02/2018   Procedure: CLOSED MANIPULATION RIGHT TOTAL KNEE;  Surgeon: Latanya Maudlin, MD;  Location: [facility];  Service: Orthopedics;  Laterality: Right;  77min   TOTAL KNEE ARTHROPLASTY Right 05/17/2018   Procedure: RIGHT TOTAL KNEE ARTHROPLASTY, INSERTION OF BONE GRAFT IN FEMORAL CANAL;  Surgeon: Latanya Maudlin, MD;  Location: [facility];  Service: Orthopedics;  Laterality: Right;   TUBAL LIGATION  YRS AGO    Family History  Problem Relation Age of Onset   Cancer Mother    Hypertension Mother     Hyperlipidemia Mother    Rectal cancer Mother    Hypertension Father    Hyperlipidemia Father    Heart Problems Father    Arthritis Father    COPD Father    Hypertension Brother    Hypertension Paternal Aunt    Heart Problems Paternal Aunt    Hypertension Paternal Aunt    Hyperlipidemia Paternal Aunt    Hypertension Paternal Aunt    Hyperlipidemia Paternal Aunt    Skin cancer Paternal Aunt    Heart Problems Paternal Aunt    Breast cancer Maternal Grandmother    Hypertension Paternal Grandmother    Heart Problems Paternal Grandmother    Hypertension Paternal Grandfather    Stroke Paternal Grandfather    Hyperlipidemia Paternal Grandfather    Social History   Socioeconomic History   Marital status: Married    Spouse name: Not on file   Number of children: Not on file   Years of education: Not on file   Highest education level: Not on file  Occupational History   Not on file  Tobacco Use   Smoking status: Every Day    Packs/day: 1.00    Years: 30.00    Total pack years: 30.00    Types: Cigarettes   Smokeless tobacco: Never  Vaping Use   Vaping Use: Former  Substance and Sexual Activity   Alcohol use: Yes    Comment: RARE   Drug use: Never   Sexual activity: Not on file  Other Topics Concern   Not on file  Social History Narrative   Not on file   Social Determinants of Health   Financial Resource Strain: Low Risk  (07/27/2022)   Overall Financial Resource Strain (CARDIA)    Difficulty of Paying Living Expenses: Not hard at all  Food Insecurity: No Food Insecurity (07/27/2022)   Hunger Vital Sign    Worried About Running Out of Food in the Last Year: Never true    Ran Out of Food in the Last Year: Never true  Transportation Needs: No Transportation Needs (07/27/2022)   PRAPARE - Hydrologist (Medical): No    Lack of Transportation (Non-Medical): No  Physical Activity: Inactive (07/27/2022)   Exercise Vital Sign    Days of  Exercise per Week: 0 days    Minutes of Exercise per Session: 0 min  Stress: No Stress Concern Present (07/27/2022)   [place]    Feeling of Stress : Not at all  Social Connections: Moderately Integrated (07/27/2022)   Social Connection and Isolation Panel [NHANES]    Frequency of Communication with  Friends and Family: More than three times a week    Frequency of Social Gatherings with Friends and Family: More than three times a week    Attends Religious Services: 1 to 4 times per year    Active Member of [employer] or Organizations: No    Attends Banker Meetings: Never    Marital Status: Married    Review of Systems  Constitutional:  Negative for chills and fatigue.  HENT:  Positive for congestion, ear pain (pressure/fullness), postnasal drip, rhinorrhea, sinus pain and sneezing. Negative for nosebleeds and sore throat.   Respiratory:  Negative for cough and shortness of breath.   Cardiovascular:  Negative for chest pain and leg swelling.  Gastrointestinal:  Negative for abdominal pain, constipation, diarrhea, nausea and vomiting.  Genitourinary:  Negative for dysuria and frequency.  Musculoskeletal:  Negative for arthralgias, back pain and myalgias.  Allergic/Immunologic: Positive for environmental allergies.  Neurological:  Negative for dizziness and headaches.     Objective:  BP (!) 140/80   Pulse 75   Temp (!) 97.3 F (36.3 C)   Ht 5\' 5"  (1.651 m)   Wt 182 lb (82.6 kg)   SpO2 100%   BMI 30.29 kg/m      08/19/2022    8:21 AM 07/27/2022    8:03 AM 04/14/2022    2:43 PM  BP/Weight  Systolic BP 140 136 136  Diastolic BP 80 86 74  Wt. (Lbs) 182 183 172  BMI 30.29 kg/m2 30.45 kg/m2 29.52 kg/m2    Physical Exam Vitals reviewed.  Constitutional:      Appearance: Normal appearance.  HENT:     Right Ear: Tenderness present. Tympanic membrane is injected.     Left Ear: Tenderness present.      Nose: Congestion and rhinorrhea present.     Mouth/Throat:     Pharynx: Posterior oropharyngeal erythema present.  Cardiovascular:     Rate and Rhythm: Normal rate and regular rhythm.     Pulses: Normal pulses.     Heart sounds: Normal heart sounds.  Pulmonary:     Effort: Pulmonary effort is normal.     Breath sounds: Normal breath sounds.  Skin:    General: Skin is warm and dry.     Capillary Refill: Capillary refill takes less than 2 seconds.  Neurological:     General: No focal deficit present.     Mental Status: She is alert and oriented to person, place, and time.  Psychiatric:        Mood and Affect: Mood normal.        Behavior: Behavior normal.         Lab Results  Component Value Date   WBC 6.4 07/27/2022   HGB 13.7 07/27/2022   HCT 41.1 07/27/2022   PLT 188 07/27/2022   GLUCOSE 88 07/27/2022   CHOL 181 07/27/2022   TRIG 200 (H) 07/27/2022   HDL 44 07/27/2022   LDLCALC 102 (H) 07/27/2022   ALT 18 07/27/2022   AST 20 07/27/2022   NA 140 07/27/2022   K 3.4 (L) 07/27/2022   CL 101 07/27/2022   CREATININE 0.64 07/27/2022   BUN 13 07/27/2022   CO2 24 07/27/2022   TSH 1.630 04/19/2022   INR 0.87 05/10/2018      Assessment & Plan:   1. Acute non-recurrent sinusitis of other sinus - azithromycin (ZITHROMAX) 250 MG tablet; Take 2 tablets on day 1, then 1 tablet daily on days 2 through 5  Dispense:  6 tablet; Refill: 0 - azelastine (ASTELIN) 0.1 % nasal spray; Place 1 spray into both nostrils 2 (two) times daily. Use in each nostril as directed  Dispense: 30 mL; Refill: 12  2. Anxiety - ALPRAZolam (XANAX) 0.25 MG tablet; Take 1 tablet (0.25 mg total) by mouth 2 (two) times daily as needed for anxiety (take one tablet by mouth as needed prior to boarding flight).  Dispense: 4 tablet; Refill: 0 -continue Lexapro 10 mg QD  3. H/O motion sickness - scopolamine (TRANSDERM-SCOP) 1 MG/3DAYS; Place 1 patch (1.5 mg total) onto the skin every 3 (three) days as needed  (motion sickness, apply after boarding cruise ship).  Dispense: 4 patch; Refill: 0  4. Acute cough - promethazine-dextromethorphan (PROMETHAZINE-DM) 6.25-15 MG/5ML syrup; Take 5 mLs by mouth 4 (four) times daily as needed.  Dispense: 118 mL; Refill: 0       Follow-up: 44-months, fasting  An After Visit Summary was printed and given to the patient.  I, Janie Morning, NP, have reviewed all documentation for this visit. The documentation on 08/19/22 for the exam, diagnosis, procedures, and orders are all accurate and complete.   Signed, Janie Morning, NP [facility] [phone]

## 2022-08-19 NOTE — Patient Instructions (Addendum)
Sinus Infection, Adult A sinus infection is soreness and swelling (inflammation) of your sinuses. Sinuses are hollow spaces in the bones around your face. They are located: Around your eyes. In the middle of your forehead. Behind your nose. In your cheekbones. Your sinuses and nasal passages are lined with a fluid called mucus. Mucus drains out of your sinuses. Swelling can trap mucus in your sinuses. This lets germs (bacteria, virus, or fungus) grow, which leads to infection. Most of the time, this condition is caused by a virus. What are the causes? Allergies. Asthma. Germs. Things that block your nose or sinuses. Growths in the nose (nasal polyps). Chemicals or irritants in the air. A fungus. This is rare. What increases the risk? Having a weak body defense system (immune system). Doing a lot of swimming or diving. Using nasal sprays too much. Smoking. What are the signs or symptoms? The main symptoms of this condition are pain and a feeling of pressure around the sinuses. Other symptoms include: Stuffy nose (congestion). This may make it hard to breathe through your nose. Runny nose (drainage). Soreness, swelling, and warmth in the sinuses. A cough that may get worse at night. Being unable to smell and taste. Mucus that collects in the throat or the back of the nose (postnasal drip). This may cause a sore throat or bad breath. Being very tired (fatigued). A fever. How is this diagnosed? Your symptoms. Your medical history. A physical exam. Tests to find out if your condition is short-term (acute) or long-term (chronic). Your doctor may: Check your nose for growths (polyps). Check your sinuses using a tool that has a light on one end (endoscope). Check for allergies or germs. Do imaging tests, such as an MRI or CT scan. How is this treated? Treatment for this condition depends on the cause and whether it is short-term or long-term. If caused by a virus, your symptoms  should go away on their own within 10 days. You may be given medicines to relieve symptoms. They include: Medicines that shrink swollen tissue in the nose. A spray that treats swelling of the nostrils. Rinses that help get rid of thick mucus in your nose (nasal saline washes). Medicines that treat allergies (antihistamines). Over-the-counter pain relievers. If caused by bacteria, your doctor may wait to see if you will get better without treatment. You may be given antibiotic medicine if you have: A very bad infection. A weak body defense system. If caused by growths in the nose, surgery may be needed. Follow these instructions at home: Medicines Take, use, or apply over-the-counter and prescription medicines only as told by your doctor. These may include nasal sprays. If you were prescribed an antibiotic medicine, take it as told by your doctor. Do not stop taking it even if you start to feel better. Hydrate and humidify  Drink enough water to keep your pee (urine) pale yellow. Use a cool mist humidifier to keep the humidity level in your home above 50%. Breathe in steam for 10-15 minutes, 3-4 times a day, or as told by your doctor. You can do this in the bathroom while a hot shower is running. Try not to spend time in cool or dry air. Rest Rest as much as you can. Sleep with your head raised (elevated). Make sure you get enough sleep each night. General instructions  Put a warm, moist washcloth on your face 3-4 times a day, or as often as told by your doctor. Use nasal saline washes as often as   told by your doctor. Wash your hands often with soap and water. If you cannot use soap and water, use hand sanitizer. Do not smoke. Avoid being around people who are smoking (secondhand smoke). Keep all follow-up visits. Contact a doctor if: You have a fever. Your symptoms get worse. Your symptoms do not get better within 10 days. Get help right away if: You have a very bad headache. You  cannot stop vomiting. You have very bad pain or swelling around your face or eyes. You have trouble seeing. You feel confused. Your neck is stiff. You have trouble breathing. These symptoms may be an emergency. Get help right away. Call 911. Do not wait to see if the symptoms will go away. Do not drive yourself to the hospital. Summary A sinus infection is swelling of your sinuses. Sinuses are hollow spaces in the bones around your face. This condition is caused by tissues in your nose that become inflamed or swollen. This traps germs. These can lead to infection. If you were prescribed an antibiotic medicine, take it as told by your doctor. Do not stop taking it even if you start to feel better. Keep all follow-up visits. This information is not intended to replace advice given to you by your health care provider. Make sure you discuss any questions you have with your health care provider. Document Revised: 09/22/2021 Document Reviewed: 09/22/2021 Elsevier Patient Education  2023 Elsevier Inc.   Potassium Content of Foods  Potassium is a mineral found in many foods and drinks. It can affect how the heart works, affect blood pressure, and keep fluids and electrolytes balanced in the body. It is important not to have too much potassium (hyperkalemia) or too little potassium (hypokalemia) in the body, especially in the blood. Potassium is naturally found in many different types of whole foods, such as fruits, vegetables, meat, and dairy products. Processed foods tend to be lower in potassium. The amount of potassium you need each day depends on your age and any medical conditions you may have. General recommendations are: Females aged 40 and older: 2,600 mg per day. Males aged 10 and older: 3,400 mg per day. Talk with your health care provider or dietitian about how much potassium you need. What foods are high in potassium? Below are examples of foods that have greater than 200 mg of  potassium per serving. Fruits Orange -- 1 medium (130 g) has 230 mg of potassium. Banana -- 1 medium (120 g) has 420 mg of potassium. Cantaloupe, chunks -- 1 cup (160 g) has 430 mg of potassium. Vegetables Potato, baked, without skin -- 1 medium (170 g) has 600 mg of potassium. Broccoli, chopped, cooked --  cup (77.5 g) has 230 mg of potassium. Tomato, chopped or sliced -- 1 cup (152 g) has 400 mg of potassium. Grains Cereal, bran with raisins -- 1 cup (59 g) has 360 mg of potassium. Granola with almonds --  cup (82 g) has 220 mg of potassium. Meats and other proteins Ground beef patty -- 4 ounces (113 g) has 240 mg of potassium. Kidney beans, boiled --  cup (130 g) has 350 mg of potassium. Almonds -- 1 ounce (approximately 22 nuts or 28 g) has 200 mg of potassium. Dairy Cow's milk, 1% -- 1 cup (237 mL) has 360 mg of potassium. Plain vanilla low-fat yogurt --  cup (184 g) has 220 mg of potassium. The items listed above may not be a complete list of foods high in potassium. Actual  amounts of potassium may be different depending on ripeness, shelf life, and food preparation. Contact a dietitian for more information. What foods are low in potassium? Below are examples of foods that have less than 200 mg of potassium per serving. Fruits Blueberries -- 1 cup (145 g) has 110 mg of potassium. Apple -- 1 medium (140 g) has 145 mg of potassium. Grapes -- 1 cup (160 g) has 175 mg of potassium. Vegetables Cabbage, raw -- 1 cup (70 g) has 120 mg of potassium. Cauliflower, chopped, cooked -- 1 cup (180 g) has 90 mg of potassium. Romaine lettuce, chopped -- 1 cup (56 g) has 120 mg of potassium. Grains Bagel, plain -- one 4-inch (10 cm) has 100 mg of potassium. Whole wheat bread -- 1 slice (26 g) has 70 mg of potassium. White rice, cooked -- 1 cup (163 g) has 50 mg of potassium. Meats and other proteins Tuna, light, canned in water -- 3 ounces (85 g) has 150 mg of potassium. Egg, fried -- 1  large (50 g) has 60 mg of potassium. Peanuts --1 ounce (35 nuts or 28 g) has 180 mg of potassium. Tofu --  cup (252 g) has 150 mg of potassium. Dairy Cheese (cheddar, colby, mozzarella, or provolone) -- 1 ounce (28 g) has 30 to 40 mg of potassium. The items listed above may not be a complete list of foods that are low in potassium. Actual amounts of potassium may be different depending on ripeness, shelf life, and food preparation. Contact a dietitian for more information. Summary Potassium is a mineral found in many foods and drinks. It affects how the heart works, affects blood pressure, and keeps fluids and electrolytes balanced in the body. The amount of potassium you need each day depends on your age and any existing medical conditions you may have. Your health care provider or dietitian may recommend an amount of potassium that you should have each day. This information is not intended to replace advice given to you by your health care provider. Make sure you discuss any questions you have with your health care provider. Document Revised: 07/21/2021 Document Reviewed: 07/02/2021 Elsevier Patient Education  [place].

## 2022-08-27 ENCOUNTER — Telehealth: Payer: 59 | Admitting: Physician Assistant

## 2022-08-27 DIAGNOSIS — B9689 Other specified bacterial agents as the cause of diseases classified elsewhere: Secondary | ICD-10-CM

## 2022-08-27 DIAGNOSIS — J019 Acute sinusitis, unspecified: Secondary | ICD-10-CM | POA: Diagnosis not present

## 2022-08-27 MED ORDER — AMOXICILLIN-POT CLAVULANATE 875-125 MG PO TABS: 1.0000 | ORAL_TABLET | Freq: Two times a day (BID) | ORAL | 0 refills | Status: DC

## 2022-08-27 NOTE — Progress Notes (Signed)
I have spent 5 minutes in review of e-visit questionnaire, review and updating patient chart, medical decision making and response to patient.   Ramzi Brathwaite Cody Genola Yuille, PA-C    

## 2022-08-27 NOTE — Progress Notes (Signed)

## 2022-09-21 ENCOUNTER — Encounter: Payer: Self-pay | Admitting: Nurse Practitioner

## 2022-09-21 ENCOUNTER — Ambulatory Visit (INDEPENDENT_AMBULATORY_CARE_PROVIDER_SITE_OTHER): Payer: 59 | Admitting: Nurse Practitioner

## 2022-09-21 VITALS — BP 120/70 | HR 78 | Temp 97.1°F | Ht 65.0 in | Wt 189.0 lb

## 2022-09-21 DIAGNOSIS — J0181 Other acute recurrent sinusitis: Secondary | ICD-10-CM

## 2022-09-21 DIAGNOSIS — Z8619 Personal history of other infectious and parasitic diseases: Secondary | ICD-10-CM | POA: Diagnosis not present

## 2022-09-21 DIAGNOSIS — J069 Acute upper respiratory infection, unspecified: Secondary | ICD-10-CM | POA: Diagnosis not present

## 2022-09-21 MED ORDER — DOXYCYCLINE HYCLATE 100 MG PO TABS: 100.0000 mg | ORAL_TABLET | Freq: Two times a day (BID) | ORAL | 0 refills | Status: DC

## 2022-09-21 MED ORDER — FLUCONAZOLE 150 MG PO TABS: 150.0000 mg | ORAL_TABLET | Freq: Once | ORAL | 0 refills | Status: AC

## 2022-09-21 NOTE — Patient Instructions (Addendum)
Take Doxycycline 100 mg twice for 7 days Take Diflucan 150 mg by mouth for yeast infection  Rest and push fluids Follow-up as needed  Sinus Infection, Adult A sinus infection is soreness and swelling (inflammation) of your sinuses. Sinuses are hollow spaces in the bones around your face. They are located: Around your eyes. In the middle of your forehead. Behind your nose. In your cheekbones. Your sinuses and nasal passages are lined with a fluid called mucus. Mucus drains out of your sinuses. Swelling can trap mucus in your sinuses. This lets germs (bacteria, virus, or fungus) grow, which leads to infection. Most of the time, this condition is caused by a virus. What are the causes? Allergies. Asthma. Germs. Things that block your nose or sinuses. Growths in the nose (nasal polyps). Chemicals or irritants in the air. A fungus. This is rare. What increases the risk? Having a weak body defense system (immune system). Doing a lot of swimming or diving. Using nasal sprays too much. Smoking. What are the signs or symptoms? The main symptoms of this condition are pain and a feeling of pressure around the sinuses. Other symptoms include: Stuffy nose (congestion). This may make it hard to breathe through your nose. Runny nose (drainage). Soreness, swelling, and warmth in the sinuses. A cough that may get worse at night. Being unable to smell and taste. Mucus that collects in the throat or the back of the nose (postnasal drip). This may cause a sore throat or bad breath. Being very tired (fatigued). A fever. How is this diagnosed? Your symptoms. Your medical history. A physical exam. Tests to find out if your condition is short-term (acute) or long-term (chronic). Your doctor may: Check your nose for growths (polyps). Check your sinuses using a tool that has a light on one end (endoscope). Check for allergies or germs. Do imaging tests, such as an MRI or CT scan. How is this  treated? Treatment for this condition depends on the cause and whether it is short-term or long-term. If caused by a virus, your symptoms should go away on their own within 10 days. You may be given medicines to relieve symptoms. They include: Medicines that shrink swollen tissue in the nose. A spray that treats swelling of the nostrils. Rinses that help get rid of thick mucus in your nose (nasal saline washes). Medicines that treat allergies (antihistamines). Over-the-counter pain relievers. If caused by bacteria, your doctor may wait to see if you will get better without treatment. You may be given antibiotic medicine if you have: A very bad infection. A weak body defense system. If caused by growths in the nose, surgery may be needed. Follow these instructions at home: Medicines Take, use, or apply over-the-counter and prescription medicines only as told by your doctor. These may include nasal sprays. If you were prescribed an antibiotic medicine, take it as told by your doctor. Do not stop taking it even if you start to feel better. Hydrate and humidify  Drink enough water to keep your pee (urine) pale yellow. Use a cool mist humidifier to keep the humidity level in your home above 50%. Breathe in steam for 10-15 minutes, 3-4 times a day, or as told by your doctor. You can do this in the bathroom while a hot shower is running. Try not to spend time in cool or dry air. Rest Rest as much as you can. Sleep with your head raised (elevated). Make sure you get enough sleep each night. General instructions  Put  a warm, moist washcloth on your face 3-4 times a day, or as often as told by your doctor. Use nasal saline washes as often as told by your doctor. Wash your hands often with soap and water. If you cannot use soap and water, use hand sanitizer. Do not smoke. Avoid being around people who are smoking (secondhand smoke). Keep all follow-up visits. Contact a doctor if: You have a  fever. Your symptoms get worse. Your symptoms do not get better within 10 days. Get help right away if: You have a very bad headache. You cannot stop vomiting. You have very bad pain or swelling around your face or eyes. You have trouble seeing. You feel confused. Your neck is stiff. You have trouble breathing. These symptoms may be an emergency. Get help right away. Call 911. Do not wait to see if the symptoms will go away. Do not drive yourself to the hospital. Summary A sinus infection is swelling of your sinuses. Sinuses are hollow spaces in the bones around your face. This condition is caused by tissues in your nose that become inflamed or swollen. This traps germs. These can lead to infection. If you were prescribed an antibiotic medicine, take it as told by your doctor. Do not stop taking it even if you start to feel better. Keep all follow-up visits. This information is not intended to replace advice given to you by your health care provider. Make sure you discuss any questions you have with your health care provider. Document Revised: 09/22/2021 Document Reviewed: 09/22/2021 Elsevier Patient Education  [place].

## 2022-09-21 NOTE — Progress Notes (Signed)
Acute Office Visit  Subjective:    Patient ID: Colleen Larsen, female    DOB: 1966-03-03, 56 y.o.   MRN: [id number]  CC: Sinus congestions  HPI: Patient is in today with  sinus congestion Upper respiratory symptoms She complains of cough described as productive of clear sputum and nasal congestion.with no fever, chills, night sweats or weight loss. Onset of symptoms was about a month ago and staying constant.She is drinking plenty of fluids.  Past history of bronchitis. Patient is smoker  (1 ppd x 40 yrs) Zpack, Amoxicillin, Allegra, Astein, and Flonase. Sudafed, Mucinex, Saline solution. Onset was 8 weeks ago.   Past Medical History:  Diagnosis Date   Arthritis    OA BOTH KNEES   GERD (gastroesophageal reflux disease)    Hyperlipidemia    Hypertension     Past Surgical History:  Procedure Laterality Date   ABDOMINAL HYSTERECTOMY  YRS AGO   PARTIAL   ANTERIOR CERVICAL DECOMP/DISCECTOMY FUSION  YRS AGO   WITH CADAVER BONE GRAFT   BILATERAL CARPAL TUNNLE RELEASE  YRS AGO   CHOLECYSTECTOMY  AGE 23   OPEN   ELBOW SURGERY Right YRS AGO   KNEE CLOSED REDUCTION Right 08/02/2018   Procedure: CLOSED MANIPULATION RIGHT TOTAL KNEE;  Surgeon: Latanya Maudlin, MD;  Location: [facility];  Service: Orthopedics;  Laterality: Right;  46mn   TOTAL KNEE ARTHROPLASTY Right 05/17/2018   Procedure: RIGHT TOTAL KNEE ARTHROPLASTY, INSERTION OF BONE GRAFT IN FEMORAL CANAL;  Surgeon: GLatanya Maudlin MD;  Location: [facility];  Service: Orthopedics;  Laterality: Right;   TUBAL LIGATION  YRS AGO    Family History  Problem Relation Age of Onset   Cancer Mother    Hypertension Mother    Hyperlipidemia Mother    Rectal cancer Mother    Hypertension Father    Hyperlipidemia Father    Heart Problems Father    Arthritis Father    COPD Father    Hypertension Brother    Hypertension Paternal Aunt    Heart Problems Paternal Aunt    Hypertension Paternal Aunt    Hyperlipidemia Paternal Aunt     Hypertension Paternal Aunt    Hyperlipidemia Paternal Aunt    Skin cancer Paternal Aunt    Heart Problems Paternal Aunt    Breast cancer Maternal Grandmother    Hypertension Paternal Grandmother    Heart Problems Paternal Grandmother    Hypertension Paternal Grandfather    Stroke Paternal Grandfather    Hyperlipidemia Paternal Grandfather     Social History   Socioeconomic History   Marital status: Married    Spouse name: Not on file   Number of children: Not on file   Years of education: Not on file   Highest education level: Not on file  Occupational History   Not on file  Tobacco Use   Smoking status: Every Day    Packs/day: 1.00    Years: 30.00    Total pack years: 30.00    Types: Cigarettes   Smokeless tobacco: Never  Vaping Use   Vaping Use: Former  Substance and Sexual Activity   Alcohol use: Yes    Comment: RARE   Drug use: Never   Sexual activity: Not on file  Other Topics Concern   Not on file  Social History Narrative   Not on file   Social Determinants of Health   Financial Resource Strain: Low Risk  (07/27/2022)   Overall Financial Resource Strain (CARDIA)    Difficulty of Paying  Living Expenses: Not hard at all  Food Insecurity: No Food Insecurity (07/27/2022)   Hunger Vital Sign    Worried About Running Out of Food in the Last Year: Never true    Ran Out of Food in the Last Year: Never true  Transportation Needs: No Transportation Needs (07/27/2022)   PRAPARE - Hydrologist (Medical): No    Lack of Transportation (Non-Medical): No  Physical Activity: Inactive (07/27/2022)   Exercise Vital Sign    Days of Exercise per Week: 0 days    Minutes of Exercise per Session: 0 min  Stress: No Stress Concern Present (07/27/2022)   [place]    Feeling of Stress : Not at all  Social Connections: Moderately Integrated (07/27/2022)   Social Connection and Isolation  Panel [NHANES]    Frequency of Communication with Friends and Family: More than three times a week    Frequency of Social Gatherings with Friends and Family: More than three times a week    Attends Religious Services: 1 to 4 times per year    Active Member of [employer] or Organizations: No    Attends Archivist Meetings: Never    Marital Status: Married  Human resources officer Violence: Not At Risk (07/27/2022)   Humiliation, Afraid, Rape, and Kick questionnaire    Fear of Current or Ex-Partner: No    Emotionally Abused: No    Physically Abused: No    Sexually Abused: No    Outpatient Medications Prior to Visit  Medication Sig Dispense Refill   azelastine (ASTELIN) 0.1 % nasal spray Place 1 spray into both nostrils 2 (two) times daily. Use in each nostril as directed 30 mL 12   cetirizine (ZYRTEC) 10 MG tablet Take 10 mg by mouth daily as needed for allergies.      escitalopram (LEXAPRO) 10 MG tablet Take 1 tablet (10 mg total) by mouth daily. 90 tablet 2   estradiol (ESTRACE) 0.5 MG tablet Take 1 tablet (0.5 mg total) by mouth daily. 90 tablet 1   fluticasone (FLONASE) 50 MCG/ACT nasal spray Place 2 sprays into both nostrils 2 (two) times daily as needed for allergies.      hydrochlorothiazide (HYDRODIURIL) 25 MG tablet Take 1 tablet (25 mg total) by mouth daily. 90 tablet 1   ibuprofen (ADVIL,MOTRIN) 200 MG tablet Take 600 mg by mouth every 4 (four) hours as needed for headache or moderate pain.     losartan (COZAAR) 50 MG tablet TAKE 1 TABLET(50 MG) BY MOUTH TWICE DAILY 90 tablet 1   Melatonin 10 MG TABS Take 10 mg by mouth at bedtime as needed (for sleep).     pantoprazole (PROTONIX) 40 MG tablet Take 1 tablet (40 mg total) by mouth daily. 90 tablet 1   rosuvastatin (CRESTOR) 5 MG tablet Take 1 tablet (5 mg total) by mouth daily. 90 tablet 1   Vitamin D, Ergocalciferol, (DRISDOL) 1.25 MG (50000 UNIT) CAPS capsule Take 1 capsule (50,000 Units total) by mouth every 7 (seven) days. 12  capsule 1   ALPRAZolam (XANAX) 0.25 MG tablet Take 1 tablet (0.25 mg total) by mouth 2 (two) times daily as needed for anxiety (take one tablet by mouth as needed prior to boarding flight). 4 tablet 0   amoxicillin-clavulanate (AUGMENTIN) 875-125 MG tablet Take 1 tablet by mouth 2 (two) times daily. 14 tablet 0   scopolamine (TRANSDERM-SCOP) 1 MG/3DAYS Place 1 patch (1.5 mg total) onto the  skin every 3 (three) days as needed (motion sickness, apply after boarding cruise ship). 4 patch 0   promethazine-dextromethorphan (PROMETHAZINE-DM) 6.25-15 MG/5ML syrup Take 5 mLs by mouth 4 (four) times daily as needed. (Patient not taking: Reported on 09/21/2022) 118 mL 0   No facility-administered medications prior to visit.    Allergies  Allergen Reactions   Celebrex [Celecoxib] Hives    Review of Systems  Constitutional:  Negative for chills and fatigue.  HENT:  Positive for congestion. Negative for ear pain, sinus pain and sore throat.   Respiratory:  Negative for cough and shortness of breath.   Cardiovascular:  Negative for chest pain and leg swelling.  Gastrointestinal:  Negative for abdominal pain, constipation, diarrhea, nausea and vomiting.  Genitourinary:  Negative for dysuria and frequency.  Musculoskeletal:  Negative for arthralgias, back pain and myalgias.  Neurological:  Negative for dizziness and headaches.       Objective:    Physical Exam Vitals reviewed.  Constitutional:      Appearance: Normal appearance.  HENT:     Right Ear: Tympanic membrane normal.     Left Ear: Tympanic membrane normal.     Nose: Congestion and rhinorrhea present.     Mouth/Throat:     Pharynx: Posterior oropharyngeal erythema present.  Cardiovascular:     Rate and Rhythm: Normal rate and regular rhythm.     Pulses: Normal pulses.     Heart sounds: Normal heart sounds.  Pulmonary:     Effort: Pulmonary effort is normal.     Comments: Diminished lung sounds in all fields Neurological:      Mental Status: She is alert.     BP 120/70   Pulse 78   Temp (!) 97.1 F (36.2 C)   Ht _0  (1.651 m)   Wt 189 lb (85.7 kg)   SpO2 97%   BMI 31.45 kg/m  Wt Readings from Last 3 Encounters:  09/21/22 189 lb (85.7 kg)  08/19/22 182 lb (82.6 kg)  07/27/22 183 lb (83 kg)    Health Maintenance Due  Topic Date Due   COLONOSCOPY (Pts 45-75yr Insurance coverage will need to be confirmed)  Never done   Lung Cancer Screening  Never done    There are no preventive care reminders to display for this patient.   Lab Results  Component Value Date   TSH 1.630 04/19/2022   Lab Results  Component Value Date   WBC 6.4 07/27/2022   HGB 13.7 07/27/2022   HCT 41.1 07/27/2022   MCV 92 07/27/2022   PLT 188 07/27/2022   Lab Results  Component Value Date   NA 140 07/27/2022   K 3.4 (L) 07/27/2022   CO2 24 07/27/2022   GLUCOSE 88 07/27/2022   BUN 13 07/27/2022   CREATININE 0.64 07/27/2022   BILITOT 0.4 07/27/2022   ALKPHOS 93 07/27/2022   AST 20 07/27/2022   ALT 18 07/27/2022   PROT 6.7 07/27/2022   ALBUMIN 4.4 07/27/2022   CALCIUM 9.5 07/27/2022   ANIONGAP 13 08/02/2018   EGFR 102 07/27/2022   Lab Results  Component Value Date   CHOL 181 07/27/2022   Lab Results  Component Value Date   HDL 44 07/27/2022   Lab Results  Component Value Date   LDLCALC 102 (H) 07/27/2022   Lab Results  Component Value Date   TRIG 200 (H) 07/27/2022   Lab Results  Component Value Date   CHOLHDL 4.1 07/27/2022   No results found for: "HGBA1C"  Assessment & Plan:   1. Other acute recurrent sinusitis - doxycycline (VIBRA-TABS) 100 MG tablet; Take 1 tablet (100 mg total) by mouth 2 (two) times daily.  Dispense: 14 tablet; Refill: 0  2. History of candidiasis of vagina - fluconazole (DIFLUCAN) 150 MG tablet; Take 1 tablet (150 mg total) by mouth once for 1 dose.  Dispense: 2 tablet; Refill: 0  3. URI, acute      Take Doxycycline 100 mg twice for 7 days Take Diflucan 150  mg by mouth for yeast infection  Rest and push fluids Follow-up as needed  Follow-up: PRN  An After Visit Summary was printed and given to the patient.  I, Rip Harbour, NP, have reviewed all documentation for this visit. The documentation on 09/21/22 for the exam, diagnosis, procedures, and orders are all accurate and complete.    Signed, Rip Harbour, NP [place] [phone]

## 2022-10-13 ENCOUNTER — Encounter: Payer: Self-pay | Admitting: Nurse Practitioner

## 2022-10-13 ENCOUNTER — Encounter: Payer: Self-pay | Admitting: Obstetrics and Gynecology

## 2022-10-13 ENCOUNTER — Ambulatory Visit: Payer: 59 | Admitting: Obstetrics and Gynecology

## 2022-10-13 ENCOUNTER — Ambulatory Visit: Payer: 59 | Admitting: Nurse Practitioner

## 2022-10-13 VITALS — BP 153/76 | HR 77 | Ht 63.0 in | Wt 184.0 lb

## 2022-10-13 VITALS — BP 128/70 | HR 97 | Temp 97.1°F | Resp 18 | Ht 64.0 in | Wt 185.0 lb

## 2022-10-13 DIAGNOSIS — F17218 Nicotine dependence, cigarettes, with other nicotine-induced disorders: Secondary | ICD-10-CM

## 2022-10-13 DIAGNOSIS — R350 Frequency of micturition: Secondary | ICD-10-CM

## 2022-10-13 DIAGNOSIS — J0181 Other acute recurrent sinusitis: Secondary | ICD-10-CM

## 2022-10-13 DIAGNOSIS — R051 Acute cough: Secondary | ICD-10-CM | POA: Diagnosis not present

## 2022-10-13 DIAGNOSIS — N393 Stress incontinence (female) (male): Secondary | ICD-10-CM | POA: Diagnosis not present

## 2022-10-13 DIAGNOSIS — Z8619 Personal history of other infectious and parasitic diseases: Secondary | ICD-10-CM | POA: Diagnosis not present

## 2022-10-13 DIAGNOSIS — R110 Nausea: Secondary | ICD-10-CM | POA: Diagnosis not present

## 2022-10-13 DIAGNOSIS — N3281 Overactive bladder: Secondary | ICD-10-CM

## 2022-10-13 LAB — POCT URINALYSIS DIPSTICK
Bilirubin, UA: NEGATIVE
Blood, UA: NEGATIVE
Glucose, UA: NEGATIVE
Ketones, UA: NEGATIVE
Leukocytes, UA: NEGATIVE
Nitrite, UA: NEGATIVE
Protein, UA: NEGATIVE
Spec Grav, UA: 1.015 (ref 1.010–1.025)
Urobilinogen, UA: 0.2 E.U./dL
pH, UA: 7 (ref 5.0–8.0)

## 2022-10-13 MED ORDER — TROSPIUM CHLORIDE ER 60 MG PO CP24: 1.0000 | ORAL_CAPSULE | Freq: Every day | ORAL | 5 refills | Status: DC

## 2022-10-13 MED ORDER — ONDANSETRON HCL 4 MG PO TABS: 4.0000 mg | ORAL_TABLET | Freq: Three times a day (TID) | ORAL | 0 refills | Status: DC | PRN

## 2022-10-13 MED ORDER — ALBUTEROL SULFATE HFA 108 (90 BASE) MCG/ACT IN AERS: 2.0000 | INHALATION_SPRAY | Freq: Four times a day (QID) | RESPIRATORY_TRACT | 0 refills | Status: DC | PRN

## 2022-10-13 MED ORDER — TRIAMCINOLONE ACETONIDE 40 MG/ML IJ SUSP
60.0000 mg | Freq: Once | INTRAMUSCULAR | Status: AC
  Administered 2022-10-13: 60 mg via INTRAMUSCULAR

## 2022-10-13 MED ORDER — FLUCONAZOLE 150 MG PO TABS: 150.0000 mg | ORAL_TABLET | ORAL | 0 refills | Status: DC

## 2022-10-13 MED ORDER — PROMETHAZINE-DM 6.25-15 MG/5ML PO SYRP: 5.0000 mL | ORAL_SOLUTION | Freq: Four times a day (QID) | ORAL | 0 refills | Status: DC | PRN

## 2022-10-13 MED ORDER — LEVOFLOXACIN 500 MG PO TABS: 500.0000 mg | ORAL_TABLET | Freq: Every day | ORAL | 0 refills | Status: AC

## 2022-10-13 NOTE — Patient Instructions (Signed)

## 2022-10-13 NOTE — Progress Notes (Signed)
Subjective:  Patient ID: Colleen Larsen, female    DOB: 1966-02-07  Age: 56 y.o. MRN: [id number]  CC: URI  HPI Upper respiratory symptoms She complains of nasal congestion, cough, shortness of breath, and fatigue. Denies fever, chills, night sweats or body aches. Onset of symptoms was a few weeks ago and gradually worsening.She is drinking plenty of fluids.  Past history is significant for allergic rhinitis. Patient is smoker  (1 ppd x 30 yrs) Pt states she has taken courses of Azithromycin, Doxycycline, and Augmentin since October 2023.    Current Outpatient Medications on File Prior to Visit  Medication Sig Dispense Refill  . azelastine (ASTELIN) 0.1 % nasal spray Place 1 spray into both nostrils 2 (two) times daily. Use in each nostril as directed 30 mL 12  . cetirizine (ZYRTEC) 10 MG tablet Take 10 mg by mouth daily as needed for allergies.     Marland Kitchen doxycycline (VIBRA-TABS) 100 MG tablet Take 1 tablet (100 mg total) by mouth 2 (two) times daily. 14 tablet 0  . escitalopram (LEXAPRO) 10 MG tablet Take 1 tablet (10 mg total) by mouth daily. 90 tablet 2  . estradiol (ESTRACE) 0.5 MG tablet Take 1 tablet (0.5 mg total) by mouth daily. 90 tablet 1  . fluticasone (FLONASE) 50 MCG/ACT nasal spray Place 2 sprays into both nostrils 2 (two) times daily as needed for allergies.     . hydrochlorothiazide (HYDRODIURIL) 25 MG tablet Take 1 tablet (25 mg total) by mouth daily. 90 tablet 1  . ibuprofen (ADVIL,MOTRIN) 200 MG tablet Take 600 mg by mouth every 4 (four) hours as needed for headache or moderate pain.    Marland Kitchen losartan (COZAAR) 50 MG tablet TAKE 1 TABLET(50 MG) BY MOUTH TWICE DAILY 90 tablet 1  . Melatonin 10 MG TABS Take 10 mg by mouth at bedtime as needed (for sleep).    . pantoprazole (PROTONIX) 40 MG tablet Take 1 tablet (40 mg total) by mouth daily. 90 tablet 1  . promethazine-dextromethorphan (PROMETHAZINE-DM) 6.25-15 MG/5ML syrup Take 5 mLs by mouth 4 (four) times daily as needed. 118 mL 0   . rosuvastatin (CRESTOR) 5 MG tablet Take 1 tablet (5 mg total) by mouth daily. 90 tablet 1  . Trospium Chloride 60 MG CP24 Take 1 capsule (60 mg total) by mouth daily. 30 capsule 5  . Vitamin D, Ergocalciferol, (DRISDOL) 1.25 MG (50000 UNIT) CAPS capsule Take 1 capsule (50,000 Units total) by mouth every 7 (seven) days. 12 capsule 1   No current facility-administered medications on file prior to visit.   Past Medical History:  Diagnosis Date  . Arthritis    OA BOTH KNEES  . GERD (gastroesophageal reflux disease)   . Hyperlipidemia   . Hypertension    Past Surgical History:  Procedure Laterality Date  . ANTERIOR CERVICAL DECOMP/DISCECTOMY FUSION  YRS AGO   WITH CADAVER BONE GRAFT  . BILATERAL CARPAL TUNNLE RELEASE  YRS AGO  . CHOLECYSTECTOMY  AGE 73   OPEN  . ELBOW SURGERY Right YRS AGO  . KNEE CLOSED REDUCTION Right 08/02/2018   Procedure: CLOSED MANIPULATION RIGHT TOTAL KNEE;  Surgeon: Latanya Maudlin, MD;  Location: [facility];  Service: Orthopedics;  Laterality: Right;  69min  . LAPAROSCOPIC HYSTERECTOMY  YRS AGO  . TOTAL KNEE ARTHROPLASTY Right 05/17/2018   Procedure: RIGHT TOTAL KNEE ARTHROPLASTY, INSERTION OF BONE GRAFT IN FEMORAL CANAL;  Surgeon: Latanya Maudlin, MD;  Location: [facility];  Service: Orthopedics;  Laterality: Right;  . TUBAL LIGATION  YRS AGO    Family History  Problem Relation Age of Onset  . Cancer Mother   . Hypertension Mother   . Hyperlipidemia Mother   . Rectal cancer Mother   . Hypertension Father   . Hyperlipidemia Father   . Heart Problems Father   . Arthritis Father   . COPD Father   . Hypertension Brother   . Hypertension Paternal Aunt   . Heart Problems Paternal Aunt   . Hypertension Paternal Aunt   . Hyperlipidemia Paternal Aunt   . Hypertension Paternal Aunt   . Hyperlipidemia Paternal Aunt   . Skin cancer Paternal Aunt   . Heart Problems Paternal Aunt   . Breast cancer Maternal Grandmother   . Hypertension Paternal Grandmother   .  Heart Problems Paternal Grandmother   . Hypertension Paternal Grandfather   . Stroke Paternal Grandfather   . Hyperlipidemia Paternal Grandfather    Social History   Socioeconomic History  . Marital status: Married    Spouse name: Not on file  . Number of children: Not on file  . Years of education: Not on file  . Highest education level: Not on file  Occupational History  . Not on file  Tobacco Use  . Smoking status: Every Day    Packs/day: 1.00    Years: 30.00    Total pack years: 30.00    Types: Cigarettes  . Smokeless tobacco: Never  Vaping Use  . Vaping Use: Former  Substance and Sexual Activity  . Alcohol use: Yes    Comment: RARE  . Drug use: Never  . Sexual activity: Yes    Birth control/protection: Surgical  Other Topics Concern  . Not on file  Social History Narrative  . Not on file   Social Determinants of Health   Financial Resource Strain: Low Risk  (07/27/2022)   Overall Financial Resource Strain (CARDIA)   . Difficulty of Paying Living Expenses: Not hard at all  Food Insecurity: No Food Insecurity (07/27/2022)   Hunger Vital Sign   . Worried About Charity fundraiser in the Last Year: Never true   . Ran Out of Food in the Last Year: Never true  Transportation Needs: No Transportation Needs (07/27/2022)   PRAPARE - Transportation   . Lack of Transportation (Medical): No   . Lack of Transportation (Non-Medical): No  Physical Activity: Inactive (07/27/2022)   Exercise Vital Sign   . Days of Exercise per Week: 0 days   . Minutes of Exercise per Session: 0 min  Stress: No Stress Concern Present (07/27/2022)   [place]   . Feeling of Stress : Not at all  Social Connections: Moderately Integrated (07/27/2022)   Social Connection and Isolation Panel [NHANES]   . Frequency of Communication with Friends and Family: More than three times a week   . Frequency of Social Gatherings with Friends  and Family: More than three times a week   . Attends Religious Services: 1 to 4 times per year   . Active Member of Clubs or Organizations: No   . Attends Archivist Meetings: Never   . Marital Status: Married    Review of Systems  Constitutional:  Positive for fatigue. Negative for chills.  HENT:  Positive for congestion, ear pain, postnasal drip, sinus pressure and sore throat. Negative for sinus pain.   Respiratory:  Positive for cough and shortness of breath (with activity).   Cardiovascular:  Negative for chest  pain and leg swelling.  Gastrointestinal:  Positive for nausea. Negative for abdominal pain, constipation, diarrhea and vomiting.  Genitourinary:  Negative for dysuria and frequency.  Musculoskeletal:  Negative for arthralgias, back pain and myalgias.  Allergic/Immunologic: Positive for environmental allergies.  Neurological:  Negative for dizziness and headaches.     Objective:  BP 128/70   Pulse 97   Temp (!) 97.1 F (36.2 C)   Resp 18   Ht 5\' 4"  (1.626 m)   Wt 185 lb (83.9 kg)   SpO2 96%   BMI 31.76 kg/m       10/13/2022    9:58 AM 09/21/2022    7:57 AM 08/19/2022    8:21 AM  BP/Weight  Systolic BP 153 120 140  Diastolic BP 76 70 80  Wt. (Lbs) 184 189 182  BMI 32.59 kg/m2 31.45 kg/m2 30.29 kg/m2    Physical Exam Vitals reviewed.  Constitutional:      Appearance: She is ill-appearing.  HENT:     Right Ear: Tympanic membrane normal.     Left Ear: Tympanic membrane normal.     Nose: Congestion and rhinorrhea present.     Mouth/Throat:     Pharynx: Posterior oropharyngeal erythema present.  Cardiovascular:     Rate and Rhythm: Normal rate and regular rhythm.     Pulses: Normal pulses.     Heart sounds: Normal heart sounds.  Pulmonary:     Effort: Pulmonary effort is normal.     Comments: Diminished lung sounds in posterior upper lobes Lymphadenopathy:     Cervical: No cervical adenopathy.  Neurological:     Mental Status: She is  alert.        Lab Results  Component Value Date   WBC 6.4 07/27/2022   HGB 13.7 07/27/2022   HCT 41.1 07/27/2022   PLT 188 07/27/2022   GLUCOSE 88 07/27/2022   CHOL 181 07/27/2022   TRIG 200 (H) 07/27/2022   HDL 44 07/27/2022   LDLCALC 102 (H) 07/27/2022   ALT 18 07/27/2022   AST 20 07/27/2022   NA 140 07/27/2022   K 3.4 (L) 07/27/2022   CL 101 07/27/2022   CREATININE 0.64 07/27/2022   BUN 13 07/27/2022   CO2 24 07/27/2022   TSH 1.630 04/19/2022   INR 0.87 05/10/2018      Assessment & Plan:   1. Other acute recurrent sinusitis - triamcinolone acetonide (KENALOG-40) injection 60 mg - levofloxacin (LEVAQUIN) 500 MG tablet; Take 1 tablet (500 mg total) by mouth daily for 5 days.  Dispense: 5 tablet; Refill: 0  2. Acute cough - albuterol (VENTOLIN HFA) 108 (90 Base) MCG/ACT inhaler; Inhale 2 puffs into the lungs every 6 (six) hours as needed for wheezing or shortness of breath.  Dispense: 8 g; Refill: 0 - promethazine-dextromethorphan (PROMETHAZINE-DM) 6.25-15 MG/5ML syrup; Take 5 mLs by mouth 4 (four) times daily as needed.  Dispense: 118 mL; Refill: 0  3. Nausea - ondansetron (ZOFRAN) 4 MG tablet; Take 1 tablet (4 mg total) by mouth every 8 (eight) hours as needed for nausea or vomiting.  Dispense: 20 tablet; Refill: 0  4. History of candidiasis of vagina - fluconazole (DIFLUCAN) 150 MG tablet; Take 1 tablet (150 mg total) by mouth every 3 (three) days.  Dispense: 2 tablet; Refill: 0  5. Cigarette nicotine dependence with other nicotine-induced disorder     Levaquin 500 mg once daily for 5 days Use Albuterol inhaler every 4-6 hours as needed for shortness of breath Kenalog 60  mg injection given in office  Take Mucinex every 12 hours as directed Take Zofran 4 mg as needed for nause Rest and push fluids Follow-up as needed  Follow-up: PRN  An After Visit Summary was printed and given to the patient.  Rip Harbour, NP [place] [phone]

## 2022-10-13 NOTE — Progress Notes (Signed)
[facility] New Patient Evaluation and Consultation  Referring Provider: Janie Morning, NP PCP: Janie Morning, NP Date of Service: 10/13/2022  SUBJECTIVE Chief Complaint: New Patient (Initial Visit) Colleen Larsen is a 56 y.o. female here for a consult for urinary incontinence. Pt said she was told she has prolapse. /)  History of Present Illness: CYERA BALBONI is a 56 y.o. White or Caucasian female seen in consultation at the request of NP Flonnie Hailstone for evaluation of incontinence.    Review of records significant for: Has symptoms of incontinence, s/p hysterectomy  Urinary Symptoms: Leaks urine with cough/ sneeze, laughing, exercise, lifting, with a full bladder, with movement to the bathroom, with urgency, while asleep, and continuously. SUI = UUI.  Leaks many time(s) per  day .  Pad use: 3 pads per day.   She is bothered by her UI symptoms. Has not had any prior treatment.   Day time voids "all the time", about every hour.  Nocturia: 2-3 times per night to void. Voiding dysfunction: she empties her bladder well.  does not use a catheter to empty bladder.  When urinating, she feels she has no difficulties Drinks: large cup hot tea (30 oz), about 30 oz water, a 16oz bottle coke per day  UTIs:  0  UTI's in the last year.   Denies history of blood in urine and kidney or bladder stones  Pelvic Organ Prolapse Symptoms:                  She Denies a feeling of a bulge the vaginal area.  Bowel Symptom: Bowel movements: 1 time(s) per day Stool consistency: soft  Straining: no.  Splinting: no.  Incomplete evacuation: no.  She Denies accidental bowel leakage / fecal incontinence Bowel regimen: none  Sexual Function Sexually active: yes.  Pain with sex: No  Pelvic Pain Denies pelvic pain    Past Medical History:  Past Medical History:  Diagnosis Date   Arthritis    OA BOTH KNEES   GERD (gastroesophageal reflux disease)     Hyperlipidemia    Hypertension      Past Surgical History:   Past Surgical History:  Procedure Laterality Date   ANTERIOR CERVICAL DECOMP/DISCECTOMY FUSION  YRS AGO   WITH CADAVER BONE GRAFT   BILATERAL CARPAL TUNNLE RELEASE  YRS AGO   CHOLECYSTECTOMY  AGE 9   OPEN   ELBOW SURGERY Right YRS AGO   KNEE CLOSED REDUCTION Right 08/02/2018   Procedure: CLOSED MANIPULATION RIGHT TOTAL KNEE;  Surgeon: Ranee Gosselin, MD;  Location: [facility];  Service: Orthopedics;  Laterality: Right;    LAPAROSCOPIC HYSTERECTOMY  YRS AGO   TOTAL KNEE ARTHROPLASTY Right 05/17/2018   Procedure: RIGHT TOTAL KNEE ARTHROPLASTY, INSERTION OF BONE GRAFT IN FEMORAL CANAL;  Surgeon: Ranee Gosselin, MD;  Location: [facility];  Service: Orthopedics;  Laterality: Right;   TUBAL LIGATION  YRS AGO     Past OB/GYN History: OB History  Gravida Para Term Preterm AB Living  3 2 2   1 2   SAB IAB Ectopic Multiple Live Births  1       2    # Outcome Date GA Lbr Len/2nd Weight Sex Delivery Anes PTL Lv  3 Term      Vag-Spont     2 Term      Vag-Spont     1 SAB            No forceps or vacuum used  in delivery.  S/p hysterectomy   Medications: She has a current medication list which includes the following prescription(s): azelastine, cetirizine, doxycycline, escitalopram, estradiol, fluticasone, hydrochlorothiazide, ibuprofen, losartan, melatonin, pantoprazole, promethazine-dextromethorphan, rosuvastatin, trospium chloride, and vitamin d (ergocalciferol).   Allergies: Patient is allergic to celebrex [celecoxib].   Social History:  Social History   Tobacco Use   Smoking status: Every Day    Packs/day: 1.00    Years: 30.00    Total pack years: 30.00    Types: Cigarettes   Smokeless tobacco: Never  Vaping Use   Vaping Use: Former  Substance Use Topics   Alcohol use: Yes    Comment: RARE   Drug use: Never    Relationship status: married She lives with husband.   She is employed as a Futures traderlabel  operator. Regular exercise: No History of abuse: No  Family History:   Family History  Problem Relation Age of Onset   Cancer Mother    Hypertension Mother    Hyperlipidemia Mother    Rectal cancer Mother    Hypertension Father    Hyperlipidemia Father    Heart Problems Father    Arthritis Father    COPD Father    Hypertension Brother    Hypertension Paternal Aunt    Heart Problems Paternal Aunt    Hypertension Paternal Aunt    Hyperlipidemia Paternal Aunt    Hypertension Paternal Aunt    Hyperlipidemia Paternal Aunt    Skin cancer Paternal Aunt    Heart Problems Paternal Aunt    Breast cancer Maternal Grandmother    Hypertension Paternal Grandmother    Heart Problems Paternal Grandmother    Hypertension Paternal Grandfather    Stroke Paternal Grandfather    Hyperlipidemia Paternal Grandfather      Review of Systems: Review of Systems  Constitutional:  Negative for fever, malaise/fatigue and weight loss.  Respiratory:  Negative for cough, shortness of breath and wheezing.   Cardiovascular:  Negative for chest pain, palpitations and leg swelling.  Gastrointestinal:  Negative for abdominal pain and blood in stool.  Genitourinary:  Negative for dysuria.  Musculoskeletal:  Negative for myalgias.  Skin:  Negative for rash.  Neurological:  Negative for dizziness and headaches.  Endo/Heme/Allergies:  Does not bruise/bleed easily.       + hot flashes  Psychiatric/Behavioral:  Negative for depression. The patient is nervous/anxious.      OBJECTIVE Physical Exam: Vitals:   10/13/22 0958  BP: (!) 153/76  Pulse: 77  Weight: 184 lb (83.5 kg)  Height: 5\' 3"  (1.6 m)    Physical Exam Constitutional:      General: She is not in acute distress. Pulmonary:     Effort: Pulmonary effort is normal.  Abdominal:     General: There is no distension.     Palpations: Abdomen is soft.     Tenderness: There is no abdominal tenderness. There is no rebound.  Musculoskeletal:         General: No swelling. Normal range of motion.  Skin:    General: Skin is warm and dry.     Findings: No rash.  Neurological:     Mental Status: She is alert and oriented to person, place, and time.  Psychiatric:        Mood and Affect: Mood normal.        Behavior: Behavior normal.      GU / Detailed Urogynecologic Evaluation:  Pelvic Exam: Normal external female genitalia; Bartholin's and Skene's glands normal in appearance; urethral  meatus normal in appearance, no urethral masses or discharge.   CST: positive   s/p hysterectomy: Speculum exam reveals normal vaginal mucosa with  atrophy and normal vaginal cuff.  Adnexa no mass, fullness, tenderness.     Pelvic floor strength I/V  Pelvic floor musculature: Right levator non-tender, Right obturator non-tender, Left levator non-tender, Left obturator non-tender  POP-Q:   POP-Q  -2.5                                            Aa   -2.5                                           Ba  -8                                              C   3                                            Gh  4                                            Pb  8                                            tvl   -3                                            Ap  -3                                            Bp                                                 D      Rectal Exam:  Normal external rectum  Post-Void Residual (PVR): In order to evaluate bladder emptying, we discussed obtaining a postvoid residual and she agreed to this procedure.  Procedure: Urethra was prepped with betadine and straight catheter placed.  A PVR of 50 ml was obtained.  Laboratory Results: POC Urine: negative  ASSESSMENT AND PLAN Ms. Joa is a 56 y.o. with:  1. Overactive bladder   2. Urinary frequency   3. SUI (stress urinary incontinence, female)    OAB - We discussed the symptoms of overactive bladder (OAB), which include urinary urgency, urinary  frequency, nocturia, with or without urge incontinence.  While  we do not know the exact etiology of OAB, several treatment options exist. We discussed management including behavioral therapy (decreasing bladder irritants, urge suppression strategies, timed voids, bladder retraining), physical therapy, medication; for refractory cases posterior tibial nerve stimulation, sacral neuromodulation, and intravesical botulinum toxin injection.  - Prescribed trospium 60mg  ER daily. For anticholinergic medications, we discussed the potential side effects of anticholinergics including dry eyes, dry mouth, constipation, cognitive impairment and urinary retention. - Discussed stopping bladder irritants, specifically tea and coke  2. SUI - For treatment of stress urinary incontinence,  non-surgical options include expectant management, weight loss, physical therapy, as well as a pessary.  Surgical options include a midurethral sling, Burch urethropexy, and transurethral injection of a bulking agent. - considering sling or urethral bulking, handouts provided on these options. Demonstrated leakage with cough today.   Return 6 weeks for follow up   , MD

## 2022-10-13 NOTE — Patient Instructions (Addendum)
Levaquin 500 mg once daily for 5 days Use Albuterol inhaler every 4-6 hours as needed for shortness of breath Kenalog 60 mg injection given in office  Take Mucinex every 12 hours as directed Take Zofran 4 mg as needed for nause Rest and push fluids, especially water Take Promethazine-DM as needed for cough Follow-up as needed  Chronic Obstructive Pulmonary Disease Exacerbation  Chronic obstructive pulmonary disease (COPD) is a long-term (chronic) condition that affects the lungs. COPD is a general term that can be used to describe many different lung problems that cause lung inflammation and limit airflow, including chronic bronchitis and emphysema. COPD exacerbations are episodes when breathing symptoms flare up, become much worse, and require extra treatment. COPD exacerbations are usually caused by infections. Without treatment, COPD exacerbations can be severe and even life threatening. Frequent COPD exacerbations can cause further damage to the lungs. What are the causes? This condition may be caused by: Respiratory infections, including viral and bacterial infections. Exposure to smoke. Exposure to air pollution, chemical fumes, or dust. Things that can cause an allergic reaction (allergens). Not taking your usual COPD medicines as directed. Underlying medical problems, such as congestive heart failure or infections not involving the lungs. In many cases, the cause of this condition is not known. What increases the risk? The following factors may make you more likely to develop this condition: Smoking cigarettes. Being an older adult. Having frequent prior COPD exacerbations. What are the signs or symptoms? Symptoms of this condition include: Increased coughing. Increased production of mucus from your lungs. Increased wheezing and shortness of breath. Rapid or labored breathing. Chest tightness. Less energy than usual. Sleep disruption from symptoms. Confusion Increased  sleepiness. Often, these symptoms happen or get worse even with the use of medicines. How is this diagnosed? This condition is diagnosed based on: Your medical history. A physical exam. You may also have tests, including: A chest X-ray. Blood tests. Lung (pulmonary) function tests. How is this treated? Treatment for this condition depends on the severity and cause of the symptoms. You may need to be admitted to a hospital for treatment. Some of the treatments commonly used to treat COPD exacerbations are: Antibiotic medicines. These may be used for severe exacerbations caused by a lung infection, such as pneumonia. Bronchodilators. These are inhaled medicines that expand the air passages and allow increased airflow. They may make your breathing more comfortable. Steroid medicines. These act to reduce inflammation in the airways. They may be given with an inhaler, taken by mouth, or given through an IV tube inserted into one of your veins. Supplemental oxygen therapy. Airway clearing techniques, such as noninvasive ventilation (NIV) and positive expiratory pressure (PEP). These provide respiratory support through a mask or other noninvasive device. An example of this would be using a continuous positive airway pressure (CPAP) machine to improve delivery of oxygen into your lungs. Follow these instructions at home: Medicines Take over-the-counter and prescription medicines only as told by your health care provider. It is important to use correct technique with inhaled medicines. If you were prescribed an antibiotic medicine or oral steroid, take it as told by your health care provider. Do not stop taking the medicine even if you start to feel better. Lifestyle Do not use any products that contain nicotine or tobacco. These products include cigarettes, chewing tobacco, and vaping devices, such as e-cigarettes. If you need help quitting, ask your health care provider. Eat a healthy  diet. Exercise regularly. Get enough sleep. Most adults need 7  or more hours per night. Avoid exposure to all substances that irritate the airway, especially tobacco smoke. Regularly wash your hands with soap and water for at least 20 seconds. If soap and water are not available, use hand sanitizer. This may help prevent you from getting infections. During flu season, avoid enclosed spaces that are crowded with people. General instructions Drink enough fluid to keep your urine pale yellow, unless you have a medical condition that requires fluid restriction. Use a cool mist vaporizer. This humidifies the air and makes it easier for you to clear your chest when you cough. If you have a home nebulizer and oxygen, continue to use them as told by your health care provider. Keep all follow-up visits. This is important. How is this prevented? Stay up-to-date on pneumococcal and flu (influenza) vaccines. A flu shot is recommended every year to help prevent exacerbations. Quitting smoking is very important in preventing COPD from getting worse and in preventing exacerbations from happening as often. Follow all instructions for pulmonary rehabilitation after a recent exacerbation. This can help prevent future exacerbations. Work with your health care provider to develop and follow an action plan. This tells you what steps to take when you experience certain symptoms. Contact a health care provider if: You have a worsening of your regular COPD symptoms. Get help right away if: You have worsening shortness of breath, even when resting. You have trouble talking. You have severe chest pain. You cough up blood. You have a fever. You have weakness, vomit repeatedly, or faint. You feel confused. You are not able to sleep because of your symptoms. You have trouble doing daily activities. These symptoms may represent a serious problem that is an emergency. Do not wait to see if the symptoms will go away.  Get medical help right away. Call your local emergency services (911 in the [place] Do not drive yourself to the hospital. Summary COPD exacerbations are episodes when breathing symptoms become much worse and require extra treatment above your normal treatment. Exacerbations can be severe and even life threatening. Frequent COPD exacerbations can cause further damage to your lungs. COPD exacerbations are usually triggered by infections such as the flu, colds, and even pneumonia. Treatment for this condition depends on the severity and cause of the symptoms. You may need to be admitted to a hospital for treatment. Quitting smoking is very important to prevent COPD from getting worse and to prevent exacerbations from happening as often. This information is not intended to replace advice given to you by your health care provider. Make sure you discuss any questions you have with your health care provider. Document Revised: 08/26/2020 Document Reviewed: 08/26/2020 Elsevier Patient Education  2023 [employer].

## 2022-10-16 ENCOUNTER — Encounter: Payer: Self-pay | Admitting: Nurse Practitioner

## 2022-11-23 ENCOUNTER — Ambulatory Visit: Payer: 59 | Admitting: Nurse Practitioner

## 2022-11-24 ENCOUNTER — Encounter: Payer: Self-pay | Admitting: Nurse Practitioner

## 2022-11-24 ENCOUNTER — Ambulatory Visit (INDEPENDENT_AMBULATORY_CARE_PROVIDER_SITE_OTHER): Payer: 59 | Admitting: Nurse Practitioner

## 2022-11-24 VITALS — BP 134/84 | HR 73 | Temp 97.1°F | Ht 65.0 in | Wt 179.0 lb

## 2022-11-24 DIAGNOSIS — I10 Essential (primary) hypertension: Secondary | ICD-10-CM

## 2022-11-24 DIAGNOSIS — F17219 Nicotine dependence, cigarettes, with unspecified nicotine-induced disorders: Secondary | ICD-10-CM

## 2022-11-24 DIAGNOSIS — E559 Vitamin D deficiency, unspecified: Secondary | ICD-10-CM

## 2022-11-24 DIAGNOSIS — K219 Gastro-esophageal reflux disease without esophagitis: Secondary | ICD-10-CM | POA: Diagnosis not present

## 2022-11-24 DIAGNOSIS — E7849 Other hyperlipidemia: Secondary | ICD-10-CM

## 2022-11-24 DIAGNOSIS — Z1211 Encounter for screening for malignant neoplasm of colon: Secondary | ICD-10-CM

## 2022-11-24 DIAGNOSIS — Z780 Asymptomatic menopausal state: Secondary | ICD-10-CM

## 2022-11-24 DIAGNOSIS — J309 Allergic rhinitis, unspecified: Secondary | ICD-10-CM

## 2022-11-24 DIAGNOSIS — F411 Generalized anxiety disorder: Secondary | ICD-10-CM

## 2022-11-24 DIAGNOSIS — Z122 Encounter for screening for malignant neoplasm of respiratory organs: Secondary | ICD-10-CM

## 2022-11-24 DIAGNOSIS — E785 Hyperlipidemia, unspecified: Secondary | ICD-10-CM | POA: Insufficient documentation

## 2022-11-24 MED ORDER — ESCITALOPRAM OXALATE 10 MG PO TABS: 10.0000 mg | ORAL_TABLET | Freq: Every day | ORAL | 2 refills | Status: DC

## 2022-11-24 MED ORDER — MONTELUKAST SODIUM 10 MG PO TABS: 10.0000 mg | ORAL_TABLET | Freq: Every day | ORAL | 3 refills | Status: DC

## 2022-11-24 MED ORDER — ESTRADIOL 0.5 MG PO TABS: 0.5000 mg | ORAL_TABLET | Freq: Every day | ORAL | 1 refills | Status: DC

## 2022-11-24 MED ORDER — VITAMIN D (ERGOCALCIFEROL) 1.25 MG (50000 UNIT) PO CAPS: 50000.0000 [IU] | ORAL_CAPSULE | ORAL | 1 refills | Status: DC

## 2022-11-24 MED ORDER — ROSUVASTATIN CALCIUM 5 MG PO TABS: 5.0000 mg | ORAL_TABLET | Freq: Every day | ORAL | 1 refills | Status: DC

## 2022-11-24 MED ORDER — FLUTICASONE PROPIONATE 50 MCG/ACT NA SUSP: 2.0000 | Freq: Two times a day (BID) | NASAL | 6 refills | Status: DC | PRN

## 2022-11-24 MED ORDER — LOSARTAN POTASSIUM 50 MG PO TABS: ORAL_TABLET | ORAL | 1 refills | Status: DC

## 2022-11-24 MED ORDER — PANTOPRAZOLE SODIUM 40 MG PO TBEC: 40.0000 mg | DELAYED_RELEASE_TABLET | Freq: Every day | ORAL | 1 refills | Status: DC

## 2022-11-24 MED ORDER — HYDROCHLOROTHIAZIDE 25 MG PO TABS: 25.0000 mg | ORAL_TABLET | Freq: Every day | ORAL | 1 refills | Status: DC

## 2022-11-24 NOTE — Patient Instructions (Signed)
Begin Singulair 10 mg at bedtime Continue Flonase and antihistamine We will call you with lab results, CT scan, and colon cancer referral Follow-up in 6 months for physical with pap smear  Allergic Rhinitis, Adult Allergic rhinitis is a reaction to allergens. Allergens are things that can cause an allergic reaction. This condition affects the lining inside the nose (mucous membrane). There are two types of allergic rhinitis: Seasonal. This type is also called hay fever. It happens only during some times of the year. Perennial. This type can happen at any time of the year. This condition cannot be spread from person to person (is not contagious). It can be mild, worse, or very bad. It can develop at any age and may be outgrown. What are the causes? This condition may be caused by: Pollen from grasses, trees, and weeds. Dust mites. Smoke. Mold. Car fumes. The pee (urine), spit, or dander of pets. Dander is dead skin cells from a pet. What increases the risk? You are more likely to develop this condition if: You have allergies in your family. You have problems like allergies in your family. You may have: Swelling of parts of your eyes and eyelids. Asthma. This affects how you breathe. Long-term redness and swelling on your skin. Food allergies. What are the signs or symptoms? The main symptom of this condition is a runny or stuffy nose (nasal congestion). Other symptoms may include: Sneezing or coughing. Itching and tearing of your eyes. Mucus that drips down the back of your throat (postnasal drip). Trouble sleeping. Feeling tired. Headache. Sore throat. How is this treated? There is no cure for this condition. You should avoid things that you are allergic to. Treatment can help to relieve symptoms. This may include: Medicines that block allergy symptoms, such as corticosteroids or antihistamines. These may be given as a shot, nasal spray, or pill. Avoiding things you are  allergic to. Medicines that give you bits of what you are allergic to over time. This is called immunotherapy. It is done if other treatments do not help. You may get: Shots. Medicine under your tongue. Stronger medicines, if other treatments do not help. Follow these instructions at home: Avoiding allergens Find out what things you are allergic to and avoid them. To do this, try these things: If you get allergies any time of year: Replace carpet with wood, tile, or vinyl flooring. Carpet can trap pet dander and dust. Do not smoke. Do not allow smoking in your home. Change your heating and air conditioning filters at least once a month. If you get allergies only some times of the year: Keep windows closed when you can. Plan things to do outside when pollen counts are lowest. Check pollen counts before you plan things to do outside. When you come indoors, change your clothes and shower before you sit on furniture or bedding. If you are allergic to a pet: Keep the pet out of your bedroom. Vacuum, sweep, and dust often. General instructions Take over-the-counter and prescription medicines only as told by your doctor. Drink enough fluid to keep your pee (urine) pale yellow. Keep all follow-up visits as told by your doctor. This is important. Where to find more information [employer] of [employer]hma [employer] Immun[employer]: www.aaaai.org Contact a doctor if: You have a fever. You get a cough that does not go away. You make whistling sounds when you breathe (wheeze). Your symptoms slow you down. Your symptoms stop you from doing your normal things each day. Get help right  away if: You are short of breath. This symptom may be an emergency. Do not wait to see if the symptom will go away. Get medical help right away. Call your local emergency services (911 in the [place] Do not drive yourself to the hospital. Summary Allergic rhinitis may be treated by taking medicines and avoiding things you  are allergic to. If you have allergies only some of the year, keep windows closed when you can at those times. Contact your doctor if you get a fever or a cough that does not go away. This information is not intended to replace advice given to you by your health care provider. Make sure you discuss any questions you have with your health care provider. Document Revised: 04/01/2022 Document Reviewed: 10/16/2019 Elsevier Patient Education  [place].

## 2022-11-24 NOTE — Progress Notes (Signed)
Subjective:  Patient ID: Colleen Larsen, female    DOB: August 01, 1966  Age: 57 y.o. MRN: [id number]  Chief Complaint  Patient presents with   Hypertension   Hyperlipidemia    HPI  Pt presents for follow-up of chronic medical problems including HTN, Vit D deficiency, and anxiety. She is due for colon and lung cancer screening. Pt 42 pack years.She declines assistance with smoking at this time. She reports chronic allergic rhinitis and recurrent sinusitis. States she was treated with multiple courses of antibiotics including Levaquin, Doxycycline, Augmentin,  and Azithromycin. States she takes an OTC antihistamine daily. Denies previous consult with allergy specialist or ENT.  Hypertension, follow-up:  She was last seen for hypertension 3 months ago.  BP at that visit was 136/86. Management includes Cozaar 50 mg BID and HCTZ 25 mg QD.  She reports good compliance with treatment. She is not having side effects.  She is following a Regular diet. She is not exercising. She does smoke. Use of agents associated with hypertension: NSAIDS.  Outside blood pressures are not being checked.  Pertinent labs: Lab Results  Component Value Date   CHOL 181 07/27/2022   HDL 44 07/27/2022   LDLCALC 102 (H) 07/27/2022   TRIG 200 (H) 07/27/2022   CHOLHDL 4.1 07/27/2022   Lab Results  Component Value Date   NA 140 07/27/2022   K 3.4 (L) 07/27/2022   CREATININE 0.64 07/27/2022   EGFR 102 07/27/2022   GFRNONAA >60 08/02/2018   GLUCOSE 88 07/27/2022     The 10-year ASCVD risk score (Arnett DK, et al., 2019) is: 9.5%    Lipid/Cholesterol, Follow-up  Last lipid panel Other pertinent labs  Lab Results  Component Value Date   CHOL 181 07/27/2022   HDL 44 07/27/2022   LDLCALC 102 (H) 07/27/2022   TRIG 200 (H) 07/27/2022   CHOLHDL 4.1 07/27/2022   Lab Results  Component Value Date   ALT 18 07/27/2022   AST 20 07/27/2022   PLT 188 07/27/2022   TSH 1.630 04/19/2022     She was last  seen for this 3 months ago.  Management includes Crestor 5 mg QD. She reports good compliance with treatment. She is not having side effects.  Current diet: well balanced Current exercise: none  The 10-year ASCVD risk score (Arnett DK, et al., 2019) is: 9.5%   Anxiety, Follow-up  She was last seen for anxiety 3 weeks ago. Current treatment includes Lexapro 10 mg QD.  She reports excellent compliance with treatment. She reports excellent tolerance of treatment. She is not having side effects.  She feels her anxiety is mild and Unchanged since last visit.   GAD-7 Results    11/24/2022   10:02 AM 07/27/2022    8:07 AM  GAD-7 Generalized Anxiety Disorder Screening Tool  1. Feeling Nervous, Anxious, or on Edge 0 3  2. Not Being Able to Stop or Control Worrying 1 3  3. Worrying Too Much About Different Things 1 3  4. Trouble Relaxing 0 1  5. Being So Restless it's Hard To Sit Still 0 0  6. Becoming Easily Annoyed or Irritable 1 1  7. Feeling Afraid As If Something Awful Might Happen 1 1  Total GAD-7 Score 4 12  Difficulty At Work, Home, or Getting  Along With Others? Not difficult at all Somewhat difficult    PHQ-9 Scores    11/24/2022   10:01 AM 08/19/2022    8:29 AM 07/27/2022    8:24 AM  PHQ9 SCORE ONLY  PHQ-9 Total Score 0 4 7    BP at that visit was 136/86. Management includes Losartan 50 mg and HCTZ 25 mg She reports excellent compliance with treatment. She is not having side effects.  She is following a Regular diet. She is not exercising. She does smoke.    Pertinent labs: Lab Results  Component Value Date   CHOL 181 07/27/2022   HDL 44 07/27/2022   LDLCALC 102 (H) 07/27/2022   TRIG 200 (H) 07/27/2022   CHOLHDL 4.1 07/27/2022   Lab Results  Component Value Date   NA 140 07/27/2022   K 3.4 (L) 07/27/2022   CREATININE 0.64 07/27/2022   EGFR 102 07/27/2022   GFRNONAA >60 08/02/2018   GLUCOSE 88 07/27/2022     Lipid/Cholesterol, Follow-up  Last  lipid panel Other pertinent labs  Lab Results  Component Value Date   CHOL 181 07/27/2022   HDL 44 07/27/2022   LDLCALC 102 (H) 07/27/2022   TRIG 200 (H) 07/27/2022   CHOLHDL 4.1 07/27/2022   Lab Results  Component Value Date   ALT 18 07/27/2022   AST 20 07/27/2022   PLT 188 07/27/2022   TSH 1.630 04/19/2022     Management includes Crestor 5 mg  She reports excellent compliance with treatment. She is not having side effects.   Vitamin D deficiency, follow-up  Lab Results  Component Value Date   VD25OH 55.1 07/27/2022   VD25OH 28.1 (L) 04/19/2022   CALCIUM 9.5 07/27/2022   CALCIUM 9.7 04/19/2022   Wt Readings from Last 3 Encounters:  11/24/22 179 lb (81.2 kg)  10/13/22 185 lb (83.9 kg)  10/13/22 184 lb (83.5 kg)   Management since that visit includes Vitamin D 50k weekly. She reports excellent compliance with treatment. She is not having side effects.    GERD, Follow up:  Current treatment consist of: Protonix 40 mg She reports excellent compliance with treatment. She is not having side effects. . She is NOT experiencing belching and eructation   ---------------------------------------------------------------------------------------------------       11/24/2022   10:01 AM 08/19/2022    8:29 AM 07/27/2022    8:24 AM 07/27/2022    8:07 AM 04/14/2022    2:52 PM  Depression screen PHQ 2/9  Decreased Interest 0 1 1 0 0  Down, Depressed, Hopeless 0 1 1 0 0  PHQ - 2 Score 0 2 2 0 0  Altered sleeping  1 1 0   Tired, decreased energy  1 1 0   Change in appetite  0 1 0   Feeling bad or failure about yourself   0 1 0   Trouble concentrating  0 1 0   Moving slowly or fidgety/restless  0 0 0   Suicidal thoughts  0 0 0   PHQ-9 Score  4 7 0   Difficult doing work/chores  Somewhat difficult Not difficult at all Not difficult at all          08/02/2018    9:02 AM 04/14/2022    2:52 PM 07/27/2022    8:07 AM 11/24/2022   10:01 AM  Fall Risk  Falls in the past year?   0 0 0  Was there an injury with Fall?  0 0 0  Fall Risk Category Calculator  0 0 0  Fall Risk Category (Retired)  Low Low   (RETIRED) Patient Fall Risk Level Low fall risk Low fall risk Low fall risk   Patient at Risk for  Falls Due to  Impaired vision No Fall Risks No Fall Risks  Fall risk Follow up  Falls evaluation completed;Falls prevention discussed Falls evaluation completed Falls evaluation completed      Review of Systems  Constitutional:  Negative for chills, fatigue and fever.  HENT:  Positive for congestion and postnasal drip. Negative for ear pain, rhinorrhea and sore throat.   Respiratory:  Positive for cough. Negative for shortness of breath.   Cardiovascular:  Negative for chest pain.  Gastrointestinal:  Negative for abdominal pain, constipation, diarrhea, nausea and vomiting.  Genitourinary:  Negative for dysuria and urgency.  Musculoskeletal:  Negative for back pain and myalgias.  Allergic/Immunologic: Positive for environmental allergies.  Neurological:  Negative for dizziness, weakness, light-headedness and headaches.  Psychiatric/Behavioral:  Negative for dysphoric mood. The patient is not nervous/anxious.     Current Outpatient Medications on File Prior to Visit  Medication Sig Dispense Refill   albuterol (VENTOLIN HFA) 108 (90 Base) MCG/ACT inhaler Inhale 2 puffs into the lungs every 6 (six) hours as needed for wheezing or shortness of breath. 8 g 0   azelastine (ASTELIN) 0.1 % nasal spray Place 1 spray into both nostrils 2 (two) times daily. Use in each nostril as directed 30 mL 12   cetirizine (ZYRTEC) 10 MG tablet Take 10 mg by mouth daily as needed for allergies.      escitalopram (LEXAPRO) 10 MG tablet Take 1 tablet (10 mg total) by mouth daily. 90 tablet 2   estradiol (ESTRACE) 0.5 MG tablet Take 1 tablet (0.5 mg total) by mouth daily. 90 tablet 1   fluticasone (FLONASE) 50 MCG/ACT nasal spray Place 2 sprays into both nostrils 2 (two) times daily as needed  for allergies.      hydrochlorothiazide (HYDRODIURIL) 25 MG tablet Take 1 tablet (25 mg total) by mouth daily. 90 tablet 1   ibuprofen (ADVIL,MOTRIN) 200 MG tablet Take 600 mg by mouth every 4 (four) hours as needed for headache or moderate pain.     losartan (COZAAR) 50 MG tablet TAKE 1 TABLET(50 MG) BY MOUTH TWICE DAILY 90 tablet 1   Melatonin 10 MG TABS Take 10 mg by mouth at bedtime as needed (for sleep).     ondansetron (ZOFRAN) 4 MG tablet Take 1 tablet (4 mg total) by mouth every 8 (eight) hours as needed for nausea or vomiting. 20 tablet 0   pantoprazole (PROTONIX) 40 MG tablet Take 1 tablet (40 mg total) by mouth daily. 90 tablet 1   rosuvastatin (CRESTOR) 5 MG tablet Take 1 tablet (5 mg total) by mouth daily. 90 tablet 1   Trospium Chloride 60 MG CP24 Take 1 capsule (60 mg total) by mouth daily. 30 capsule 5   Vitamin D, Ergocalciferol, (DRISDOL) 1.25 MG (50000 UNIT) CAPS capsule Take 1 capsule (50,000 Units total) by mouth every 7 (seven) days. 12 capsule 1   No current facility-administered medications on file prior to visit.   Past Medical History:  Diagnosis Date   Arthritis    OA BOTH KNEES   GERD (gastroesophageal reflux disease)    Hyperlipidemia    Hypertension    Past Surgical History:  Procedure Laterality Date   ANTERIOR CERVICAL DECOMP/DISCECTOMY FUSION  YRS AGO   WITH CADAVER BONE GRAFT   BILATERAL CARPAL TUNNLE RELEASE  YRS AGO   CHOLECYSTECTOMY  AGE 23   OPEN   ELBOW SURGERY Right YRS AGO   KNEE CLOSED REDUCTION Right 08/02/2018   Procedure: CLOSED MANIPULATION RIGHT TOTAL KNEE;  Surgeon: Ranee Gosselin, MD;  Location: [facility];  Service: Orthopedics;  Laterality: Right;    LAPAROSCOPIC HYSTERECTOMY  YRS AGO   TOTAL KNEE ARTHROPLASTY Right 05/17/2018   Procedure: RIGHT TOTAL KNEE ARTHROPLASTY, INSERTION OF BONE GRAFT IN FEMORAL CANAL;  Surgeon: Ranee Gosselin, MD;  Location: [facility];  Service: Orthopedics;  Laterality: Right;   TUBAL LIGATION  YRS AGO     Family History  Problem Relation Age of Onset   Cancer Mother    Hypertension Mother    Hyperlipidemia Mother    Rectal cancer Mother    Hypertension Father    Hyperlipidemia Father    Heart Problems Father    Arthritis Father    COPD Father    Hypertension Brother    Hypertension Paternal Aunt    Heart Problems Paternal Aunt    Hypertension Paternal Aunt    Hyperlipidemia Paternal Aunt    Hypertension Paternal Aunt    Hyperlipidemia Paternal Aunt    Skin cancer Paternal Aunt    Heart Problems Paternal Aunt    Breast cancer Maternal Grandmother    Hypertension Paternal Grandmother    Heart Problems Paternal Grandmother    Hypertension Paternal Grandfather    Stroke Paternal Grandfather    Hyperlipidemia Paternal Grandfather    Social History   Socioeconomic History   Marital status: Married    Spouse name: Not on file   Number of children: Not on file   Years of education: Not on file   Highest education level: Not on file  Occupational History   Not on file  Tobacco Use   Smoking status: Every Day    Packs/day: 1.00    Years: 30.00    Total pack years: 30.00    Types: Cigarettes   Smokeless tobacco: Never  Vaping Use   Vaping Use: Former  Substance and Sexual Activity   Alcohol use: Yes    Comment: RARE   Drug use: Never   Sexual activity: Yes    Birth control/protection: Surgical  Other Topics Concern   Not on file  Social History Narrative   Not on file   Social Determinants of Health   Financial Resource Strain: Low Risk  (07/27/2022)   Overall Financial Resource Strain (CARDIA)    Difficulty of Paying Living Expenses: Not hard at all  Food Insecurity: No Food Insecurity (07/27/2022)   Hunger Vital Sign    Worried About Running Out of Food in the Last Year: Never true    Ran Out of Food in the Last Year: Never true  Transportation Needs: No Transportation Needs (07/27/2022)   PRAPARE - Administrator, Civil Service (Medical): No     Lack of Transportation (Non-Medical): No  Physical Activity: Inactive (07/27/2022)   Exercise Vital Sign    Days of Exercise per Week: 0 days    Minutes of Exercise per Session: 0 min  Stress: No Stress Concern Present (07/27/2022)   [employer] of Occupational Health - Occupational Stress Questionnaire    Feeling of Stress : Not at all  Social Connections: Moderately Integrated (07/27/2022)   Social Connection and Isolation Panel [NHANES]    Frequency of Communication with Friends and Family: More than three times a week    Frequency of Social Gatherings with Friends and Family: More than three times a week    Attends Religious Services: 1 to 4 times per year    Active Member of [employer] or Organizations: No    Attends [employer]  or Organization Meetings: Never    Marital Status: Married    Objective:  BP 134/84   Pulse 73   Temp (!) 97.1 F (36.2 C)   Ht 5\' 5"  (1.651 m)   Wt 179 lb (81.2 kg)   SpO2 95%   BMI 29.79 kg/m       11/24/2022    9:55 AM 10/13/2022    1:28 PM 10/13/2022    9:58 AM  BP/Weight  Systolic BP  202 542  Diastolic BP  70 76  Wt. (Lbs) 179 185 184  BMI 29.79 kg/m2 31.76 kg/m2 32.59 kg/m2    Physical Exam Vitals reviewed.  Constitutional:      Appearance: Normal appearance.  HENT:     Right Ear: Tympanic membrane is injected.     Left Ear: Tympanic membrane normal.     Nose: Congestion present. No rhinorrhea.     Mouth/Throat:     Mouth: Mucous membranes are moist.     Pharynx: Posterior oropharyngeal erythema present.  Cardiovascular:     Rate and Rhythm: Normal rate and regular rhythm.     Pulses: Normal pulses.     Heart sounds: Normal heart sounds.  Pulmonary:     Effort: Pulmonary effort is normal.     Breath sounds: Normal breath sounds.  Abdominal:     General: Bowel sounds are normal.     Palpations: Abdomen is soft.  Skin:    General: Skin is warm and dry.     Capillary Refill: Capillary refill takes less than 2 seconds.   Neurological:     General: No focal deficit present.     Mental Status: She is alert and oriented to person, place, and time.  Psychiatric:        Mood and Affect: Mood normal.        Behavior: Behavior normal.         Lab Results  Component Value Date   WBC 6.4 07/27/2022   HGB 13.7 07/27/2022   HCT 41.1 07/27/2022   PLT 188 07/27/2022   GLUCOSE 88 07/27/2022   CHOL 181 07/27/2022   TRIG 200 (H) 07/27/2022   HDL 44 07/27/2022   LDLCALC 102 (H) 07/27/2022   ALT 18 07/27/2022   AST 20 07/27/2022   NA 140 07/27/2022   K 3.4 (L) 07/27/2022   CL 101 07/27/2022   CREATININE 0.64 07/27/2022   BUN 13 07/27/2022   CO2 24 07/27/2022   TSH 1.630 04/19/2022   INR 0.87 05/10/2018      Assessment & Plan:     1. Primary hypertension-well controlled - CBC with Differential/Platelet - Comprehensive metabolic panel - losartan (COZAAR) 50 MG tablet; TAKE 1 TABLET(50 MG) BY MOUTH TWICE DAILY  Dispense: 90 tablet; Refill: 1 - hydrochlorothiazide (HYDRODIURIL) 25 MG tablet; Take 1 tablet (25 mg total) by mouth daily.  Dispense: 90 tablet; Refill: 1 -low salt DASH diet -increase physical activity  2. Other hyperlipidemia-not at goal - CBC with Differential/Platelet - Comprehensive metabolic panel - Lipid panel - rosuvastatin (CRESTOR) 5 MG tablet; Take 1 tablet (5 mg total) by mouth daily.  Dispense: 90 tablet; Refill: 1  3. Gastroesophageal reflux disease, unspecified whether esophagitis present-well controlled - CBC with Differential/Platelet - Comprehensive metabolic panel - pantoprazole (PROTONIX) 40 MG tablet; Take 1 tablet (40 mg total) by mouth daily.  Dispense: 90 tablet; Refill: 1 -avoid foods that trigger GERD  4. Vitamin D deficiency-not at goal - VITAMIN D 25 Hydroxy (Vit-D Deficiency, Fractures) -  Vitamin D, Ergocalciferol, (DRISDOL) 1.25 MG (50000 UNIT) CAPS capsule; Take 1 capsule (50,000 Units total) by mouth every 7 (seven) days.  Dispense: 12 capsule;  Refill: 1 -Vit D rich diet  5. Chronic allergic rhinitis-not at goal - CBC with Differential/Platelet - Comprehensive metabolic panel - fluticasone (FLONASE) 50 MCG/ACT nasal spray; Place 2 sprays into both nostrils 2 (two) times daily as needed for allergies.  Dispense: 16 g; Refill: 6 - montelukast (SINGULAIR) 10 MG tablet; Take 1 tablet (10 mg total) by mouth at bedtime.  Dispense: 90 tablet; Refill: 3 -continue OTC antihistamine -consider referral to ENT or allergy specialist if symptoms fail to improve  6. GAD (generalized anxiety disorder)-well controllled - escitalopram (LEXAPRO) 10 MG tablet; Take 1 tablet (10 mg total) by mouth daily.  Dispense: 90 tablet; Refill: 2  7. Cigarette nicotine dependence with nicotine-induced disorder - CBC with Differential/Platelet - Comprehensive metabolic panel - CT CHEST LUNG CANCER SCREENING LOW DOSE WO CONTRAST; Future -recommend smoking cessation  8. Postmenopausal estrogen deficiency - estradiol (ESTRACE) 0.5 MG tablet; Take 1 tablet (0.5 mg total) by mouth daily.  Dispense: 90 tablet; Refill: 1  9. Screening for lung cancer - CT CHEST LUNG CANCER SCREENING LOW DOSE WO CONTRAST; Future  10. Screening for colon cancer - Ambulatory referral to Gastroenterology       Begin Singulair 10 mg at bedtime Continue Flonase and antihistamine We will call you with lab results, CT scan, and colon cancer referral Follow-up in 6 months for physical with pap smear   Follow-up: 6 months  An After Visit Summary was printed and given to the patient.  I, Janie Morning, NP, have reviewed all documentation for this visit. The documentation on 11/24/22 for the exam, diagnosis, procedures, and orders are all accurate and complete.   Janie Morning, NP [facility] [phone]

## 2022-11-25 ENCOUNTER — Ambulatory Visit: Payer: 59 | Admitting: Obstetrics and Gynecology

## 2022-11-25 LAB — LIPID PANEL
Chol/HDL Ratio: 3.4 ratio (ref 0.0–4.4)
Cholesterol, Total: 185 mg/dL (ref 100–199)
HDL: 54 mg/dL (ref >39)
LDL Chol Calc (NIH): 111 mg/dL — ABNORMAL HIGH (ref 0–99)
Triglycerides: 113 mg/dL (ref 0–149)
VLDL Cholesterol Cal: 20 mg/dL (ref 5–40)

## 2022-11-25 LAB — COMPREHENSIVE METABOLIC PANEL
ALT: 11 IU/L (ref 0–32)
AST: 14 IU/L (ref 0–40)
Albumin/Globulin Ratio: 1.7 (ref 1.2–2.2)
Albumin: 4.3 g/dL (ref 3.8–4.9)
Alkaline Phosphatase: 109 IU/L (ref 44–121)
BUN/Creatinine Ratio: 24 — ABNORMAL HIGH (ref 9–23)
BUN: 17 mg/dL (ref 6–24)
Bilirubin Total: 0.3 mg/dL (ref 0.0–1.2)
CO2: 24 mmol/L (ref 20–29)
Calcium: 9.6 mg/dL (ref 8.7–10.2)
Chloride: 102 mmol/L (ref 96–106)
Creatinine, Ser: 0.7 mg/dL (ref 0.57–1.00)
Globulin, Total: 2.5 g/dL (ref 1.5–4.5)
Glucose: 91 mg/dL (ref 70–99)
Potassium: 3.6 mmol/L (ref 3.5–5.2)
Sodium: 141 mmol/L (ref 134–144)
Total Protein: 6.8 g/dL (ref 6.0–8.5)
eGFR: 100 mL/min/{1.73_m2} (ref >59)

## 2022-11-25 LAB — CBC WITH DIFFERENTIAL/PLATELET
Basophils Absolute: 0 10*3/uL (ref 0.0–0.2)
Basos: 0 %
EOS (ABSOLUTE): 0.1 10*3/uL (ref 0.0–0.4)
Eos: 1 %
Hematocrit: 39.8 % (ref 34.0–46.6)
Hemoglobin: 13.3 g/dL (ref 11.1–15.9)
Immature Grans (Abs): 0 10*3/uL (ref 0.0–0.1)
Immature Granulocytes: 0 %
Lymphocytes Absolute: 1.9 10*3/uL (ref 0.7–3.1)
Lymphs: 21 %
MCH: 30 pg (ref 26.6–33.0)
MCHC: 33.4 g/dL (ref 31.5–35.7)
MCV: 90 fL (ref 79–97)
Monocytes Absolute: 0.5 10*3/uL (ref 0.1–0.9)
Monocytes: 6 %
Neutrophils Absolute: 6.3 10*3/uL (ref 1.4–7.0)
Neutrophils: 72 %
Platelets: 217 10*3/uL (ref 150–450)
RBC: 4.43 x10E6/uL (ref 3.77–5.28)
RDW: 12.7 % (ref 11.7–15.4)
WBC: 8.9 10*3/uL (ref 3.4–10.8)

## 2022-11-25 LAB — VITAMIN D 25 HYDROXY (VIT D DEFICIENCY, FRACTURES): Vit D, 25-Hydroxy: 44.2 ng/mL (ref 30.0–100.0)

## 2022-11-25 LAB — CARDIOVASCULAR RISK ASSESSMENT

## 2022-11-26 ENCOUNTER — Ambulatory Visit: Payer: 59 | Admitting: Nurse Practitioner

## 2022-11-26 ENCOUNTER — Ambulatory Visit: Payer: 59 | Admitting: Obstetrics and Gynecology

## 2022-11-30 ENCOUNTER — Encounter: Payer: Self-pay | Admitting: Gastroenterology

## 2022-12-01 ENCOUNTER — Encounter: Payer: Self-pay | Admitting: Family Medicine

## 2022-12-01 ENCOUNTER — Other Ambulatory Visit: Payer: Self-pay

## 2022-12-01 DIAGNOSIS — J449 Chronic obstructive pulmonary disease, unspecified: Secondary | ICD-10-CM | POA: Insufficient documentation

## 2022-12-01 DIAGNOSIS — Z122 Encounter for screening for malignant neoplasm of respiratory organs: Secondary | ICD-10-CM

## 2022-12-01 DIAGNOSIS — F17219 Nicotine dependence, cigarettes, with unspecified nicotine-induced disorders: Secondary | ICD-10-CM

## 2022-12-01 NOTE — Progress Notes (Signed)
 Urogynecology Return Visit  SUBJECTIVE  History of Present Illness: Colleen Larsen is a 57 y.o. female seen in follow-up for OAB and SUI. Plan at last visit was start Trospium 60mg  and look through options for SUI (Urethral bulking vs sling).   Reports nocturia has improved and she is not getting up as often (sometimes 1-2 times per night). Urinating 8-10 times per day.   Has decreased soda and caffeinated beverages and made lifestyle changes.   SUI is significantly bothersome. She has decided she wants a sling.   Patient reports her urine has been a darker color with more urgency.   Past Medical History: Patient  has a past medical history of Arthritis, COPD (chronic obstructive pulmonary disease) ([place]), GERD (gastroesophageal reflux disease), Hyperlipidemia, and Hypertension.   Past Surgical History: She  has a past surgical history that includes Cholecystectomy (AGE 78); Laparoscopic hysterectomy (YRS AGO); Tubal ligation (YRS AGO); Elbow surgery (Right, YRS AGO); BILATERAL CARPAL TUNNLE RELEASE (YRS AGO); Anterior cervical decomp/discectomy fusion (YRS AGO); Total knee arthroplasty (Right, 05/17/2018); and Knee Closed Reduction (Right, 08/02/2018).   Medications: She has a current medication list which includes the following prescription(s): albuterol, azelastine, cetirizine, escitalopram, [START ON 12/06/2022] estradiol, estradiol, fluticasone, hydrochlorothiazide, ibuprofen, losartan, melatonin, montelukast, ondansetron, pantoprazole, rosuvastatin, trospium chloride, and vitamin d (ergocalciferol).   Allergies: Patient is allergic to celebrex [celecoxib].   Social History: Patient  reports that she has been smoking cigarettes. She has a 30.00 pack-year smoking history. She has never used smokeless tobacco. She reports current alcohol use. She reports that she does not use drugs.      OBJECTIVE     Physical Exam: Vitals:   12/03/22 1115  BP: 136/82  Pulse: 98    Gen: No apparent distress, A&O x 3.  Detailed Urogynecologic Evaluation:  Deferred.      ASSESSMENT AND PLAN    [name] is a 57 y.o. with:  1. Urinary frequency   2. Hematuria, unspecified type   3. Overactive bladder   4. SUI (stress urinary incontinence, female)   5. Vaginal atrophy    POC urine shows positive nitrates and some trace blood, will send for culture.  Sending for culture, will treat if positive for UTI.  Trospium is working well. Wishes to continue as this has helped with OAB and nocturia.  Has decided on surgical sling. Will send message to surgery scheduling to get patient scheduled for sling. Leakage was demonstrated in the office when coughing.  Patient reports she has vaginal dryness. Will start on estrogen cream nightly for two weeks and then twice weekly to assist with vaginal atrophy.   Patient to schedule for surgical sling and will follow up for Pre-op.

## 2022-12-03 ENCOUNTER — Encounter: Payer: Self-pay | Admitting: Obstetrics and Gynecology

## 2022-12-03 ENCOUNTER — Ambulatory Visit: Payer: 59 | Admitting: Obstetrics and Gynecology

## 2022-12-03 VITALS — BP 136/82 | HR 98

## 2022-12-03 DIAGNOSIS — N3281 Overactive bladder: Secondary | ICD-10-CM

## 2022-12-03 DIAGNOSIS — R350 Frequency of micturition: Secondary | ICD-10-CM | POA: Diagnosis not present

## 2022-12-03 DIAGNOSIS — N952 Postmenopausal atrophic vaginitis: Secondary | ICD-10-CM

## 2022-12-03 DIAGNOSIS — N393 Stress incontinence (female) (male): Secondary | ICD-10-CM | POA: Diagnosis not present

## 2022-12-03 DIAGNOSIS — R319 Hematuria, unspecified: Secondary | ICD-10-CM

## 2022-12-03 LAB — POCT URINALYSIS DIPSTICK
Bilirubin, UA: NEGATIVE
Blood, UA: POSITIVE
Glucose, UA: NEGATIVE
Ketones, UA: NEGATIVE
Leukocytes, UA: NEGATIVE
Nitrite, UA: POSITIVE
Protein, UA: NEGATIVE
Spec Grav, UA: 1.02 (ref 1.010–1.025)
Urobilinogen, UA: 0.2 E.U./dL
pH, UA: 7.5 (ref 5.0–8.0)

## 2022-12-03 MED ORDER — ESTRADIOL 0.1 MG/GM VA CREA: 0.5000 g | TOPICAL_CREAM | VAGINAL | 11 refills | Status: DC

## 2022-12-03 NOTE — Patient Instructions (Addendum)
Continue the trospium for now Will plan for a sling and have surgery scheduler call you.   Will call you on Monday with results of culture and if you need an antibiotic.

## 2022-12-06 ENCOUNTER — Other Ambulatory Visit: Payer: Self-pay

## 2022-12-06 DIAGNOSIS — R051 Acute cough: Secondary | ICD-10-CM

## 2022-12-06 MED ORDER — ALBUTEROL SULFATE HFA 108 (90 BASE) MCG/ACT IN AERS: 2.0000 | INHALATION_SPRAY | Freq: Four times a day (QID) | RESPIRATORY_TRACT | 2 refills | Status: DC | PRN

## 2022-12-07 ENCOUNTER — Telehealth: Payer: Self-pay | Admitting: Obstetrics and Gynecology

## 2022-12-07 DIAGNOSIS — N3000 Acute cystitis without hematuria: Secondary | ICD-10-CM

## 2022-12-07 LAB — URINE CULTURE

## 2022-12-07 MED ORDER — CEPHALEXIN 250 MG PO CAPS: 250.0000 mg | ORAL_CAPSULE | Freq: Four times a day (QID) | ORAL | 0 refills | Status: DC

## 2022-12-08 NOTE — Telephone Encounter (Signed)
Attempted to call patient and inform of UTI from culture. Sent my-chart message. Will attempt to follow up today.

## 2022-12-13 NOTE — Progress Notes (Signed)
FMLA forms were faxed and  were received by the [facility] from [place] ([place]) Pt notified there will be a 10-14 day turn around for forms to be ready.  Pt notified  she will be contacted as soon as the from are ready and have been faxed.  Pre-op date: 12/13/22 Surgery date: 01/19/23 Post op date:02/14/23

## 2022-12-20 ENCOUNTER — Encounter: Payer: Self-pay | Admitting: *Deleted

## 2022-12-27 ENCOUNTER — Encounter: Payer: Self-pay | Admitting: Obstetrics and Gynecology

## 2022-12-27 DIAGNOSIS — B379 Candidiasis, unspecified: Secondary | ICD-10-CM

## 2022-12-27 MED ORDER — FLUCONAZOLE 150 MG PO TABS: 150.0000 mg | ORAL_TABLET | Freq: Once | ORAL | 0 refills | Status: AC

## 2022-12-28 ENCOUNTER — Ambulatory Visit (AMBULATORY_SURGERY_CENTER): Payer: 59 | Admitting: *Deleted

## 2022-12-28 VITALS — Ht 65.0 in | Wt 175.0 lb

## 2022-12-28 DIAGNOSIS — Z1211 Encounter for screening for malignant neoplasm of colon: Secondary | ICD-10-CM

## 2022-12-28 MED ORDER — NA SULFATE-K SULFATE-MG SULF 17.5-3.13-1.6 GM/177ML PO SOLN: 1.0000 | Freq: Once | ORAL | 0 refills | Status: AC

## 2022-12-28 NOTE — Progress Notes (Signed)
No egg or soy allergy known to patient  No issues known to pt with past sedation with any surgeries or procedures Patient denies ever being told they had issues or difficulty with intubation  No FH of Malignant Hyperthermia Pt is not on diet pills Pt is not on  home 02  Pt is not on blood thinners  Pt denies issues with constipation  Pt is not on dialysis Pt denies any upcoming cardiac testing Pt encouraged to use to use Singlecare or Goodrx to reduce cost  Patient's chart reviewed by Colleen Larsen CNRA prior to previsit and patient appropriate for the [place].  Previsit completed and red dot placed by patient's name on their procedure day (on provider's schedule).  . Visit by phone Instructions reviewed with pt and pt states understanding. Instructed to review again prior to procedure. Pt states they will.Instructions have been sent by mail with coupon and by my chart

## 2022-12-31 ENCOUNTER — Encounter: Payer: Self-pay | Admitting: Gastroenterology

## 2023-01-18 NOTE — Progress Notes (Unsigned)
[facility] Pre-Operative Evaluation  Subjective Chief Complaint: Colleen Larsen presents for a preoperative encounter.   History of Present Illness: Colleen Larsen is a 57 y.o. female who presents for preoperative visit.  She is scheduled to undergo Exam Under Anesthesia,  Mid-Urethral Sling, and Cystoscopy on 02/14/2023.  Her symptoms include Stress Urinary Incontinence.    Urodynamics showed: Deferred as patient had active leaking during pelvic exam  Past Medical History:  Diagnosis Date   Anxiety    Arthritis    OA BOTH KNEES   COPD (chronic obstructive pulmonary disease) (HCC)    GERD (gastroesophageal reflux disease)    Hyperlipidemia    Hypertension      Past Surgical History:  Procedure Laterality Date   ANTERIOR CERVICAL DECOMP/DISCECTOMY FUSION  YRS AGO   WITH CADAVER BONE GRAFT   BILATERAL CARPAL TUNNLE RELEASE  YRS AGO   CHOLECYSTECTOMY  AGE 19   OPEN   ELBOW SURGERY Right YRS AGO   KNEE CLOSED REDUCTION Right 08/02/2018   Procedure: CLOSED MANIPULATION RIGHT TOTAL KNEE;  Surgeon: Latanya Maudlin, MD;  Location: [facility];  Service: Orthopedics;  Laterality: Right;  38min   LAPAROSCOPIC HYSTERECTOMY  YRS AGO   TOTAL KNEE ARTHROPLASTY Right 05/17/2018   Procedure: RIGHT TOTAL KNEE ARTHROPLASTY, INSERTION OF BONE GRAFT IN FEMORAL CANAL;  Surgeon: Latanya Maudlin, MD;  Location: [facility];  Service: Orthopedics;  Laterality: Right;   TUBAL LIGATION  YRS AGO    is allergic to celebrex [celecoxib].   Family History  Problem Relation Age of Onset   Cancer Mother    Hypertension Mother    Hyperlipidemia Mother    Rectal cancer Mother    Hypertension Father    Hyperlipidemia Father    Heart Problems Father    Arthritis Father    COPD Father    Hypertension Brother    Hypertension Paternal Aunt    Heart Problems Paternal Aunt    Hypertension Paternal Aunt    Hyperlipidemia Paternal Aunt    Hypertension Paternal Aunt    Hyperlipidemia Paternal  Aunt    Skin cancer Paternal Aunt    Heart Problems Paternal Aunt    Breast cancer Maternal Grandmother    Hypertension Paternal Grandmother    Heart Problems Paternal Grandmother    Hypertension Paternal Grandfather    Stroke Paternal Grandfather    Hyperlipidemia Paternal Grandfather    Colon cancer Neg Hx    Colon polyps Neg Hx    Esophageal cancer Neg Hx    Stomach cancer Neg Hx     Social History   Tobacco Use   Smoking status: Every Day    Packs/day: 1.00    Years: 30.00    Additional pack years: 0.00    Total pack years: 30.00    Types: Cigarettes   Smokeless tobacco: Never  Vaping Use   Vaping Use: Former  Substance Use Topics   Alcohol use: Yes    Comment: RARE   Drug use: Never     Review of Systems was negative for a full 10 system review except as noted in the History of Present Illness.   Current Outpatient Medications:    albuterol (VENTOLIN HFA) 108 (90 Base) MCG/ACT inhaler, Inhale 2 puffs into the lungs every 6 (six) hours as needed for wheezing or shortness of breath. (Patient not taking: Reported on 12/28/2022), Disp: 8 g, Rfl: 2   azelastine (ASTELIN) 0.1 % nasal spray, Place 1 spray into both nostrils 2 (two) times  daily. Use in each nostril as directed, Disp: 30 mL, Rfl: 12   cephALEXin (KEFLEX) 250 MG capsule, Take 1 capsule (250 mg total) by mouth 4 (four) times daily. (Patient not taking: Reported on 12/28/2022), Disp: 12 capsule, Rfl: 0   cetirizine (ZYRTEC) 10 MG tablet, Take 10 mg by mouth daily as needed for allergies. , Disp: , Rfl:    escitalopram (LEXAPRO) 10 MG tablet, Take 1 tablet (10 mg total) by mouth daily., Disp: 90 tablet, Rfl: 2   estradiol (ESTRACE) 0.1 MG/GM vaginal cream, Place 0.5 g vaginally 2 (two) times a week. Place 0.5g nightly for two weeks then twice a week after, Disp: 30 g, Rfl: 11   estradiol (ESTRACE) 0.5 MG tablet, Take 1 tablet (0.5 mg total) by mouth daily., Disp: 90 tablet, Rfl: 1   fluticasone (FLONASE) 50 MCG/ACT  nasal spray, Place 2 sprays into both nostrils 2 (two) times daily as needed for allergies., Disp: 16 g, Rfl: 6   hydrochlorothiazide (HYDRODIURIL) 25 MG tablet, Take 1 tablet (25 mg total) by mouth daily., Disp: 90 tablet, Rfl: 1   ibuprofen (ADVIL,MOTRIN) 200 MG tablet, Take 600 mg by mouth every 4 (four) hours as needed for headache or moderate pain., Disp: , Rfl:    losartan (COZAAR) 50 MG tablet, TAKE 1 TABLET(50 MG) BY MOUTH TWICE DAILY, Disp: 90 tablet, Rfl: 1   Melatonin 10 MG TABS, Take 10 mg by mouth at bedtime as needed (for sleep)., Disp: , Rfl:    montelukast (SINGULAIR) 10 MG tablet, Take 1 tablet (10 mg total) by mouth at bedtime., Disp: 90 tablet, Rfl: 3   ondansetron (ZOFRAN) 4 MG tablet, Take 1 tablet (4 mg total) by mouth every 8 (eight) hours as needed for nausea or vomiting. (Patient not taking: Reported on 12/28/2022), Disp: 20 tablet, Rfl: 0   pantoprazole (PROTONIX) 40 MG tablet, Take 1 tablet (40 mg total) by mouth daily., Disp: 90 tablet, Rfl: 1   rosuvastatin (CRESTOR) 5 MG tablet, Take 1 tablet (5 mg total) by mouth daily., Disp: 90 tablet, Rfl: 1   Trospium Chloride 60 MG CP24, Take 1 capsule (60 mg total) by mouth daily., Disp: 30 capsule, Rfl: 5   Vitamin D, Ergocalciferol, (DRISDOL) 1.25 MG (50000 UNIT) CAPS capsule, Take 1 capsule (50,000 Units total) by mouth every 7 (seven) days., Disp: 12 capsule, Rfl: 1   Objective There were no vitals filed for this visit.  Gen: NAD CV: S1 S2 and S3 heard on auscultation. Patient reports she has never seen cardiology but has been told previously she has a slight murmur by her PCP Lungs: Clear to auscultation bilaterally Abd: soft, nontender   Previous Pelvic Exam showed: Normal external female genitalia; Bartholin's and Skene's glands normal in appearance; urethral meatus normal in appearance, no urethral masses or discharge.    CST: positive     s/p hysterectomy: Speculum exam reveals normal vaginal mucosa with   atrophy and normal vaginal cuff.  Adnexa no mass, fullness, tenderness.       Pelvic floor strength I/V   Pelvic floor musculature: Right levator non-tender, Right obturator non-tender, Left levator non-tender, Left obturator non-tender   POP-Q:    POP-Q   -2.5                                            Aa   -2.5  Ba   -8                                              C    3                                            Gh   4                                            Pb   8                                            tvl    -3                                            Ap   -3                                            Bp                                                  D          Rectal Exam:  Normal external rectum    Assessment/ Plan  Assessment: The patient is a 57 y.o. year old scheduled to undergo Exam Under Anesthesia,  Mid-Urethral Sling, and Cystoscopy. Verbal consent was obtained for these procedures.  Plan: General Surgical Consent: The patient has previously been counseled on alternative treatments, and the decision by the patient and provider was to proceed with the procedure listed above.  For all procedures, there are risks of bleeding, infection, damage to surrounding organs including but not limited to bowel, bladder, blood vessels, ureters and nerves, and need for further surgery if an injury were to occur. These risks are all low with minimally invasive surgery.   There are risks of numbness and weakness at any body site or buttock/rectal pain.  It is possible that baseline pain can be worsened by surgery, either with or without mesh. If surgery is vaginal, there is also a low risk of possible conversion to laparoscopy or open abdominal incision where indicated. Very rare risks include blood transfusion, blood clot, heart attack, pneumonia, or death.   There is also a risk of short-term postoperative urinary  retention with need to use a catheter. About half of patients need to go home from surgery with a catheter, which is then later removed in the office. The risk of long-term need for a catheter is very low. There is also a risk of worsening of overactive bladder.   Sling: The effectiveness of a midurethral vaginal mesh sling  is approximately 85%, and thus, there will be times when you may leak urine after surgery, especially if your bladder is full or if you have a strong cough. There is a balance between making the sling tight enough to treat your leakage but not too tight so that you have long-term difficulty emptying your bladder. A mesh sling will not directly treat overactive bladder/urge incontinence and may worsen it.  There is an FDA safety notification on vaginal mesh procedures for prolapse but NOT mesh slings. We have extensive experience and training with mesh placement and we have close postoperative follow up to identify any potential complications from mesh. It is important to realize that this mesh is a permanent implant that cannot be easily removed. There are rare risks of mesh exposure (2-4%), pain with intercourse (0-7%), and infection (<1%). The risk of mesh exposure if more likely in a woman with risks for poor healing (prior radiation, poorly controlled diabetes, or immunocompromised). The risk of new or worsened chronic pain after mesh implant is more common in women with baseline chronic pain and/or poorly controlled anxiety or depression. Approximately 2-4% of patients will experience longer-term post-operative voiding dysfunction that may require surgical revision of the sling. We also reviewed that postoperatively, her stream may not be as strong as before surgery.    We discussed consent for blood products. Risks for blood transfusion include allergic reactions, other reactions that can affect different body organs and managed accordingly, transmission of infectious diseases such as  HIV or Hepatitis. However, the blood is screened. Patient consents for blood products.  Pre-operative instructions:  She was instructed to not take Aspirin/NSAIDs x 7days prior to surgery. Antibiotic prophylaxis was ordered as indicated.  Catheter use: Patient will go home with foley if needed after post-operative voiding trial.  Post-operative instructions:  She was provided with specific post-operative instructions, including precautions and signs/symptoms for which we would recommend contacting us, in addition to daytime and after-hours contact phone numbers. This was provided on a handout.   Post-operative medications: Prescriptions for motrin, tylenol, miralax, and oxycodone were sent to her pharmacy. Discussed using ibuprofen and tylenol on a schedule to limit use of narcotics.   Laboratory testing:  We will check labs as requested by anesthesia.   Preoperative clearance:  She does not require surgical clearance.    Post-operative follow-up:  A post-operative appointment will be made for 6 weeks from the date of surgery. If she needs a post-operative nurse visit for a voiding trial, that will be set up after she leaves the hospital.    Patient will call the clinic or use MyChart should anything change or any new issues arise.   Berton Mount, NP

## 2023-01-19 ENCOUNTER — Encounter: Payer: Self-pay | Admitting: Obstetrics and Gynecology

## 2023-01-19 ENCOUNTER — Ambulatory Visit (INDEPENDENT_AMBULATORY_CARE_PROVIDER_SITE_OTHER): Payer: 59 | Admitting: Obstetrics and Gynecology

## 2023-01-19 VITALS — BP 128/81 | HR 80 | Wt 176.4 lb

## 2023-01-19 DIAGNOSIS — N393 Stress incontinence (female) (male): Secondary | ICD-10-CM | POA: Diagnosis not present

## 2023-01-19 DIAGNOSIS — Z01818 Encounter for other preprocedural examination: Secondary | ICD-10-CM

## 2023-01-19 MED ORDER — POLYETHYLENE GLYCOL 3350 17 GM/SCOOP PO POWD: 17.0000 g | Freq: Every day | ORAL | 0 refills | Status: DC

## 2023-01-19 MED ORDER — ACETAMINOPHEN 500 MG PO TABS: 500.0000 mg | ORAL_TABLET | Freq: Four times a day (QID) | ORAL | 0 refills | Status: DC | PRN

## 2023-01-19 MED ORDER — OXYCODONE HCL 5 MG PO TABS: 5.0000 mg | ORAL_TABLET | ORAL | 0 refills | Status: DC | PRN

## 2023-01-19 MED ORDER — IBUPROFEN 600 MG PO TABS: 600.0000 mg | ORAL_TABLET | Freq: Four times a day (QID) | ORAL | 0 refills | Status: DC | PRN

## 2023-01-19 NOTE — Patient Instructions (Signed)
The week after surgery you can start doing a small amount of the vaginal estrogen cream into the vagina to support tissue healing.  Medications have been sent to the pharmacy.

## 2023-01-19 NOTE — H&P (Signed)
[facility] H&P  Subjective Chief Complaint: Colleen Larsen presents for a preoperative encounter.   History of Present Illness: Colleen Larsen is a 57 y.o. female who presents for preoperative visit.  She is scheduled to undergo Exam Under Anesthesia,  Mid-Urethral Sling, and Cystoscopy on 02/14/2023.  Her symptoms include Stress Urinary Incontinence.    Urodynamics showed: Deferred as patient had active leaking during pelvic exam  Past Medical History:  Diagnosis Date   Anxiety    Arthritis    OA BOTH KNEES   COPD (chronic obstructive pulmonary disease) (HCC)    GERD (gastroesophageal reflux disease)    Hyperlipidemia    Hypertension      Past Surgical History:  Procedure Laterality Date   ANTERIOR CERVICAL DECOMP/DISCECTOMY FUSION  YRS AGO   WITH CADAVER BONE GRAFT   BILATERAL CARPAL TUNNLE RELEASE  YRS AGO   CHOLECYSTECTOMY  AGE 26   OPEN   ELBOW SURGERY Right YRS AGO   KNEE CLOSED REDUCTION Right 08/02/2018   Procedure: CLOSED MANIPULATION RIGHT TOTAL KNEE;  Surgeon: Latanya Maudlin, MD;  Location: [facility];  Service: Orthopedics;  Laterality: Right;  57min   LAPAROSCOPIC HYSTERECTOMY  YRS AGO   TOTAL KNEE ARTHROPLASTY Right 05/17/2018   Procedure: RIGHT TOTAL KNEE ARTHROPLASTY, INSERTION OF BONE GRAFT IN FEMORAL CANAL;  Surgeon: Latanya Maudlin, MD;  Location: [facility];  Service: Orthopedics;  Laterality: Right;   TUBAL LIGATION  YRS AGO    is allergic to celebrex [celecoxib].   Family History  Problem Relation Age of Onset   Cancer Mother    Hypertension Mother    Hyperlipidemia Mother    Rectal cancer Mother    Hypertension Father    Hyperlipidemia Father    Heart Problems Father    Arthritis Father    COPD Father    Hypertension Brother    Hypertension Paternal Aunt    Heart Problems Paternal Aunt    Hypertension Paternal Aunt    Hyperlipidemia Paternal Aunt    Hypertension Paternal Aunt    Hyperlipidemia Paternal Aunt    Skin cancer  Paternal Aunt    Heart Problems Paternal Aunt    Breast cancer Maternal Grandmother    Hypertension Paternal Grandmother    Heart Problems Paternal Grandmother    Hypertension Paternal Grandfather    Stroke Paternal Grandfather    Hyperlipidemia Paternal Grandfather    Colon cancer Neg Hx    Colon polyps Neg Hx    Esophageal cancer Neg Hx    Stomach cancer Neg Hx     Social History   Tobacco Use   Smoking status: Every Day    Packs/day: 1.00    Years: 30.00    Additional pack years: 0.00    Total pack years: 30.00    Types: Cigarettes   Smokeless tobacco: Never  Vaping Use   Vaping Use: Former  Substance Use Topics   Alcohol use: Yes    Comment: RARE   Drug use: Never     Review of Systems was negative for a full 10 system review except as noted in the History of Present Illness.  No current facility-administered medications for this encounter.  Current Outpatient Medications:    acetaminophen (TYLENOL) 500 MG tablet, Take 1 tablet (500 mg total) by mouth every 6 (six) hours as needed (pain)., Disp: 30 tablet, Rfl: 0   albuterol (VENTOLIN HFA) 108 (90 Base) MCG/ACT inhaler, Inhale 2 puffs into the lungs every 6 (six) hours as needed for  wheezing or shortness of breath. (Patient not taking: Reported on 12/28/2022), Disp: 8 g, Rfl: 2   azelastine (ASTELIN) 0.1 % nasal spray, Place 1 spray into both nostrils 2 (two) times daily. Use in each nostril as directed, Disp: 30 mL, Rfl: 12   cephALEXin (KEFLEX) 250 MG capsule, Take 1 capsule (250 mg total) by mouth 4 (four) times daily. (Patient not taking: Reported on 12/28/2022), Disp: 12 capsule, Rfl: 0   cetirizine (ZYRTEC) 10 MG tablet, Take 10 mg by mouth daily as needed for allergies. , Disp: , Rfl:    escitalopram (LEXAPRO) 10 MG tablet, Take 1 tablet (10 mg total) by mouth daily., Disp: 90 tablet, Rfl: 2   estradiol (ESTRACE) 0.1 MG/GM vaginal cream, Place 0.5 g vaginally 2 (two) times a week. Place 0.5g nightly for two weeks  then twice a week after, Disp: 30 g, Rfl: 11   estradiol (ESTRACE) 0.5 MG tablet, Take 1 tablet (0.5 mg total) by mouth daily., Disp: 90 tablet, Rfl: 1   fluticasone (FLONASE) 50 MCG/ACT nasal spray, Place 2 sprays into both nostrils 2 (two) times daily as needed for allergies., Disp: 16 g, Rfl: 6   hydrochlorothiazide (HYDRODIURIL) 25 MG tablet, Take 1 tablet (25 mg total) by mouth daily., Disp: 90 tablet, Rfl: 1   ibuprofen (ADVIL) 600 MG tablet, Take 1 tablet (600 mg total) by mouth every 6 (six) hours as needed., Disp: 30 tablet, Rfl: 0   ibuprofen (ADVIL,MOTRIN) 200 MG tablet, Take 600 mg by mouth every 4 (four) hours as needed for headache or moderate pain., Disp: , Rfl:    losartan (COZAAR) 50 MG tablet, TAKE 1 TABLET(50 MG) BY MOUTH TWICE DAILY, Disp: 90 tablet, Rfl: 1   Melatonin 10 MG TABS, Take 10 mg by mouth at bedtime as needed (for sleep)., Disp: , Rfl:    montelukast (SINGULAIR) 10 MG tablet, Take 1 tablet (10 mg total) by mouth at bedtime., Disp: 90 tablet, Rfl: 3   ondansetron (ZOFRAN) 4 MG tablet, Take 1 tablet (4 mg total) by mouth every 8 (eight) hours as needed for nausea or vomiting. (Patient not taking: Reported on 12/28/2022), Disp: 20 tablet, Rfl: 0   oxyCODONE (OXY IR/ROXICODONE) 5 MG immediate release tablet, Take 1 tablet (5 mg total) by mouth every 4 (four) hours as needed for severe pain., Disp: 15 tablet, Rfl: 0   pantoprazole (PROTONIX) 40 MG tablet, Take 1 tablet (40 mg total) by mouth daily., Disp: 90 tablet, Rfl: 1   polyethylene glycol powder (GLYCOLAX/MIRALAX) 17 GM/SCOOP powder, Take 17 g by mouth daily. Drink 17g (1 scoop) dissolved in water per day., Disp: 255 g, Rfl: 0   rosuvastatin (CRESTOR) 5 MG tablet, Take 1 tablet (5 mg total) by mouth daily., Disp: 90 tablet, Rfl: 1   Trospium Chloride 60 MG CP24, Take 1 capsule (60 mg total) by mouth daily., Disp: 30 capsule, Rfl: 5   Vitamin D, Ergocalciferol, (DRISDOL) 1.25 MG (50000 UNIT) CAPS capsule, Take 1 capsule  (50,000 Units total) by mouth every 7 (seven) days., Disp: 12 capsule, Rfl: 1   Objective There were no vitals filed for this visit.  Gen: NAD CV: S1 S2 and S3 heard on auscultation. Patient reports she has never seen cardiology but has been told previously she has a slight murmur by her PCP Lungs: Clear to auscultation bilaterally Abd: soft, nontender   Previous Pelvic Exam showed: Normal external female genitalia; Bartholin's and Skene's glands normal in appearance; urethral meatus normal in appearance, no  urethral masses or discharge.    CST: positive     s/p hysterectomy: Speculum exam reveals normal vaginal mucosa with  atrophy and normal vaginal cuff.  Adnexa no mass, fullness, tenderness.       Pelvic floor strength I/V   Pelvic floor musculature: Right levator non-tender, Right obturator non-tender, Left levator non-tender, Left obturator non-tender   POP-Q:    POP-Q   -2.5                                            Aa   -2.5                                           Ba   -8                                              C    3                                            Gh   4                                            Pb   8                                            tvl    -3                                            Ap   -3                                            Bp                                                  D          Rectal Exam:  Normal external rectum    Assessment/ Plan  Assessment: The patient is a 57 y.o. year old scheduled to undergo Exam Under Anesthesia,  Mid-Urethral Sling, and Cystoscopy. Verbal consent was obtained for these procedures.

## 2023-01-25 ENCOUNTER — Ambulatory Visit (AMBULATORY_SURGERY_CENTER): Payer: 59 | Admitting: Gastroenterology

## 2023-01-25 ENCOUNTER — Encounter: Payer: Self-pay | Admitting: Gastroenterology

## 2023-01-25 VITALS — BP 122/67 | HR 68 | Temp 97.7°F | Resp 17 | Ht 65.0 in | Wt 175.0 lb

## 2023-01-25 DIAGNOSIS — D123 Benign neoplasm of transverse colon: Secondary | ICD-10-CM

## 2023-01-25 DIAGNOSIS — Z1211 Encounter for screening for malignant neoplasm of colon: Secondary | ICD-10-CM | POA: Diagnosis present

## 2023-01-25 DIAGNOSIS — D122 Benign neoplasm of ascending colon: Secondary | ICD-10-CM

## 2023-01-25 DIAGNOSIS — D127 Benign neoplasm of rectosigmoid junction: Secondary | ICD-10-CM

## 2023-01-25 DIAGNOSIS — K635 Polyp of colon: Secondary | ICD-10-CM

## 2023-01-25 MED ORDER — SODIUM CHLORIDE 0.9 % IV SOLN: 500.0000 mL | Freq: Once | INTRAVENOUS | Status: DC

## 2023-01-25 NOTE — Patient Instructions (Signed)
   Handouts on polyps,diverticulosis,& hemorrhoids given to you today.   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  If the procedure report does not answer your questions, please call your gastroenterologist to clarify.  If you requested that your care partner not be given the details of your procedure findings, then the procedure report has been included in a sealed envelope for you to review at your convenience later.  YOU SHOULD EXPECT: Some feelings of bloating in the abdomen. Passage of more gas than usual.  Walking can help get rid of the air that was put into your GI tract during the procedure and reduce the bloating. If you had a lower endoscopy (such as a colonoscopy or flexible sigmoidoscopy) you may notice spotting of blood in your stool or on the toilet paper. If you underwent a bowel prep for your procedure, you may not have a normal bowel movement for a few days.  Please Note:  You might notice some irritation and congestion in your nose or some drainage.  This is from the oxygen used during your procedure.  There is no need for concern and it should clear up in a day or so.  SYMPTOMS TO REPORT IMMEDIATELY:  Following lower endoscopy (colonoscopy or flexible sigmoidoscopy):  Excessive amounts of blood in the stool  Significant tenderness or worsening of abdominal pains  Swelling of the abdomen that is new, acute  Fever of 100F or higher   For urgent or emergent issues, a gastroenterologist can be reached at any hour by calling [phone]. Do not use MyChart messaging for urgent concerns.    DIET:  We do recommend a small meal at first, but then you may proceed to your regular diet.  Drink plenty of fluids but  you should avoid alcoholic beverages for 24 hours.  ACTIVITY:  You should plan to take it easy for the rest of today and you should NOT DRIVE or use heavy machinery until tomorrow (because of the sedation medicines used during the test).    FOLLOW UP: Our staff will call the number listed on your records the next business day following your procedure.  We will call around 7:15- 8:00 am to check on you and address any questions or concerns that you may have regarding the information given to you following your procedure. If we do not reach you, we will leave a message.     If any biopsies were taken you will be contacted by phone or by letter within the next 1-3 weeks.  Please call us at [phone] if you have not heard about the biopsies in 3 weeks.    SIGNATURES/CONFIDENTIALITY: You and/or your care partner have signed paperwork which will be entered into your electronic medical record.  These signatures attest to the fact that that the information above on your After Visit Summary has been reviewed and is understood.  Full responsibility of the confidentiality of this discharge information lies with you and/or your care-partner.

## 2023-01-25 NOTE — Progress Notes (Signed)
[facility] History and Physical   Primary Care Physician:  Rip Harbour, NP (Inactive)   Reason for Procedure:    for CRC screening  Plan:     colonoscopy     HPI: Colleen Larsen is a 57 y.o. female    Past Medical History:  Diagnosis Date   Anxiety    Arthritis    OA BOTH KNEES   COPD (chronic obstructive pulmonary disease) ([place])    GERD (gastroesophageal reflux disease)    Hyperlipidemia    Hypertension     Past Surgical History:  Procedure Laterality Date   ANTERIOR CERVICAL DECOMP/DISCECTOMY FUSION  YRS AGO   WITH CADAVER BONE GRAFT   BILATERAL CARPAL TUNNLE RELEASE  YRS AGO   CHOLECYSTECTOMY  AGE 23   OPEN   ELBOW SURGERY Right YRS AGO   KNEE CLOSED REDUCTION Right 08/02/2018   Procedure: CLOSED MANIPULATION RIGHT TOTAL KNEE;  Surgeon: Latanya Maudlin, MD;  Location: [facility];  Service: Orthopedics;  Laterality: Right;  4min   LAPAROSCOPIC HYSTERECTOMY  YRS AGO   TOTAL KNEE ARTHROPLASTY Right 05/17/2018   Procedure: RIGHT TOTAL KNEE ARTHROPLASTY, INSERTION OF BONE GRAFT IN FEMORAL CANAL;  Surgeon: Latanya Maudlin, MD;  Location: [facility];  Service: Orthopedics;  Laterality: Right;   TUBAL LIGATION  YRS AGO    Prior to Admission medications   Medication Sig Start Date End Date Taking? Authorizing Provider  acetaminophen (TYLENOL) 500 MG tablet Take 1 tablet (500 mg total) by mouth every 6 (six) hours as needed (pain). 01/19/23  Yes Zuleta, Valora Corporal, NP  azelastine (ASTELIN) 0.1 % nasal spray Place 1 spray into both nostrils 2 (two) times daily. Use in each nostril as directed 08/19/22  Yes Heaton, Thornell Mule, NP  cetirizine (ZYRTEC) 10 MG tablet Take 10 mg by mouth daily as needed for allergies.    Yes [provider]  escitalopram (LEXAPRO) 10 MG tablet Take 1 tablet (10 mg total) by mouth daily. 11/24/22  Yes Rip Harbour, NP  estradiol (ESTRACE) 0.1 MG/GM vaginal cream Place 0.5 g vaginally 2 (two) times a week. Place 0.5g nightly  for two weeks then twice a week after 12/06/22  Yes Zuleta, Valora Corporal, NP  estradiol (ESTRACE) 0.5 MG tablet Take 1 tablet (0.5 mg total) by mouth daily. 11/24/22  Yes Rip Harbour, NP  fluticasone (FLONASE) 50 MCG/ACT nasal spray Place 2 sprays into both nostrils 2 (two) times daily as needed for allergies. 11/24/22  Yes Rip Harbour, NP  hydrochlorothiazide (HYDRODIURIL) 25 MG tablet Take 1 tablet (25 mg total) by mouth daily. 11/24/22  Yes Rip Harbour, NP  losartan (COZAAR) 50 MG tablet TAKE 1 TABLET(50 MG) BY MOUTH TWICE DAILY 11/24/22  Yes Rip Harbour, NP  Melatonin 10 MG TABS Take 10 mg by mouth at bedtime as needed (for sleep).   Yes [provider]  montelukast (SINGULAIR) 10 MG tablet Take 1 tablet (10 mg total) by mouth at bedtime. 11/24/22  Yes Rip Harbour, NP  pantoprazole (PROTONIX) 40 MG tablet Take 1 tablet (40 mg total) by mouth daily. 11/24/22  Yes Rip Harbour, NP  rosuvastatin (CRESTOR) 5 MG tablet Take 1 tablet (5 mg total) by mouth daily. 11/24/22  Yes Rip Harbour, NP  Trospium Chloride 60 MG CP24 Take 1 capsule (60 mg total) by mouth daily. 10/13/22  Yes Jaquita Folds, MD  Vitamin D, Ergocalciferol, (DRISDOL) 1.25 MG (50000 UNIT) CAPS capsule Take 1 capsule (50,000  Units total) by mouth every 7 (seven) days. 11/24/22  Yes Rip Harbour, NP  albuterol (VENTOLIN HFA) 108 (90 Base) MCG/ACT inhaler Inhale 2 puffs into the lungs every 6 (six) hours as needed for wheezing or shortness of breath. Patient not taking: Reported on 12/28/2022 12/06/22   CoxElnita Maxwell, MD  cephALEXin (KEFLEX) 250 MG capsule Take 1 capsule (250 mg total) by mouth 4 (four) times daily. Patient not taking: Reported on 12/28/2022 12/07/22   Berton Mount, NP  ibuprofen (ADVIL) 600 MG tablet Take 1 tablet (600 mg total) by mouth every 6 (six) hours as needed. Patient not taking: Reported on 01/25/2023 01/19/23   Berton Mount, NP  ibuprofen (ADVIL,MOTRIN) 200 MG  tablet Take 600 mg by mouth every 4 (four) hours as needed for headache or moderate pain. Patient not taking: Reported on 01/25/2023    [provider]  ondansetron (ZOFRAN) 4 MG tablet Take 1 tablet (4 mg total) by mouth every 8 (eight) hours as needed for nausea or vomiting. Patient not taking: Reported on 12/28/2022 10/13/22   Rip Harbour, NP  oxyCODONE (OXY IR/ROXICODONE) 5 MG immediate release tablet Take 1 tablet (5 mg total) by mouth every 4 (four) hours as needed for severe pain. Patient not taking: Reported on 01/25/2023 01/19/23   Berton Mount, NP  polyethylene glycol powder (GLYCOLAX/MIRALAX) 17 GM/SCOOP powder Take 17 g by mouth daily. Drink 17g (1 scoop) dissolved in water per day. Patient not taking: Reported on 01/25/2023 01/19/23   Berton Mount, NP    Current Outpatient Medications  Medication Sig Dispense Refill   acetaminophen (TYLENOL) 500 MG tablet Take 1 tablet (500 mg total) by mouth every 6 (six) hours as needed (pain). 30 tablet 0   azelastine (ASTELIN) 0.1 % nasal spray Place 1 spray into both nostrils 2 (two) times daily. Use in each nostril as directed 30 mL 12   cetirizine (ZYRTEC) 10 MG tablet Take 10 mg by mouth daily as needed for allergies.      escitalopram (LEXAPRO) 10 MG tablet Take 1 tablet (10 mg total) by mouth daily. 90 tablet 2   estradiol (ESTRACE) 0.1 MG/GM vaginal cream Place 0.5 g vaginally 2 (two) times a week. Place 0.5g nightly for two weeks then twice a week after 30 g 11   estradiol (ESTRACE) 0.5 MG tablet Take 1 tablet (0.5 mg total) by mouth daily. 90 tablet 1   fluticasone (FLONASE) 50 MCG/ACT nasal spray Place 2 sprays into both nostrils 2 (two) times daily as needed for allergies. 16 g 6   hydrochlorothiazide (HYDRODIURIL) 25 MG tablet Take 1 tablet (25 mg total) by mouth daily. 90 tablet 1   losartan (COZAAR) 50 MG tablet TAKE 1 TABLET(50 MG) BY MOUTH TWICE DAILY 90 tablet 1   Melatonin 10 MG TABS Take 10 mg by mouth at  bedtime as needed (for sleep).     montelukast (SINGULAIR) 10 MG tablet Take 1 tablet (10 mg total) by mouth at bedtime. 90 tablet 3   pantoprazole (PROTONIX) 40 MG tablet Take 1 tablet (40 mg total) by mouth daily. 90 tablet 1   rosuvastatin (CRESTOR) 5 MG tablet Take 1 tablet (5 mg total) by mouth daily. 90 tablet 1   Trospium Chloride 60 MG CP24 Take 1 capsule (60 mg total) by mouth daily. 30 capsule 5   Vitamin D, Ergocalciferol, (DRISDOL) 1.25 MG (50000 UNIT) CAPS capsule Take 1 capsule (50,000 Units total) by mouth every 7 (seven) days.  12 capsule 1   albuterol (VENTOLIN HFA) 108 (90 Base) MCG/ACT inhaler Inhale 2 puffs into the lungs every 6 (six) hours as needed for wheezing or shortness of breath. (Patient not taking: Reported on 12/28/2022) 8 g 2   cephALEXin (KEFLEX) 250 MG capsule Take 1 capsule (250 mg total) by mouth 4 (four) times daily. (Patient not taking: Reported on 12/28/2022) 12 capsule 0   ibuprofen (ADVIL) 600 MG tablet Take 1 tablet (600 mg total) by mouth every 6 (six) hours as needed. (Patient not taking: Reported on 01/25/2023) 30 tablet 0   ibuprofen (ADVIL,MOTRIN) 200 MG tablet Take 600 mg by mouth every 4 (four) hours as needed for headache or moderate pain. (Patient not taking: Reported on 01/25/2023)     ondansetron (ZOFRAN) 4 MG tablet Take 1 tablet (4 mg total) by mouth every 8 (eight) hours as needed for nausea or vomiting. (Patient not taking: Reported on 12/28/2022) 20 tablet 0   oxyCODONE (OXY IR/ROXICODONE) 5 MG immediate release tablet Take 1 tablet (5 mg total) by mouth every 4 (four) hours as needed for severe pain. (Patient not taking: Reported on 01/25/2023) 15 tablet 0   polyethylene glycol powder (GLYCOLAX/MIRALAX) 17 GM/SCOOP powder Take 17 g by mouth daily. Drink 17g (1 scoop) dissolved in water per day. (Patient not taking: Reported on 01/25/2023) 255 g 0   Current Facility-Administered Medications  Medication Dose Route Frequency Provider Last Rate Last Admin    0.9 %  sodium chloride infusion  500 mL Intravenous Once Jackquline Denmark, MD        Allergies as of 01/25/2023 - Review Complete 01/25/2023  Allergen Reaction Noted   Celebrex [celecoxib] Hives 05/03/2018    Family History  Problem Relation Age of Onset   Cancer Mother    Hypertension Mother    Hyperlipidemia Mother    Rectal cancer Mother    Hypertension Father    Hyperlipidemia Father    Heart Problems Father    Arthritis Father    COPD Father    Hypertension Brother    Hypertension Paternal Aunt    Heart Problems Paternal Aunt    Hypertension Paternal Aunt    Hyperlipidemia Paternal Aunt    Hypertension Paternal Aunt    Hyperlipidemia Paternal Aunt    Skin cancer Paternal Aunt    Heart Problems Paternal Aunt    Breast cancer Maternal Grandmother    Hypertension Paternal Grandmother    Heart Problems Paternal Grandmother    Hypertension Paternal Grandfather    Stroke Paternal Grandfather    Hyperlipidemia Paternal Grandfather    Colon cancer Neg Hx    Colon polyps Neg Hx    Esophageal cancer Neg Hx    Stomach cancer Neg Hx     Social History   Socioeconomic History   Marital status: Married    Spouse name: Not on file   Number of children: Not on file   Years of education: Not on file   Highest education level: Not on file  Occupational History   Not on file  Tobacco Use   Smoking status: Every Day    Packs/day: 1.00    Years: 30.00    Additional pack years: 0.00    Total pack years: 30.00    Types: Cigarettes   Smokeless tobacco: Never  Vaping Use   Vaping Use: Some days  Substance and Sexual Activity   Alcohol use: Yes    Comment: RARE   Drug use: Never   Sexual activity: Yes  Birth control/protection: Surgical  Other Topics Concern   Not on file  Social History Narrative   Not on file   Social Determinants of Health   Financial Resource Strain: Low Risk  (07/27/2022)   Overall Financial Resource Strain (CARDIA)    Difficulty of Paying  Living Expenses: Not hard at all  Food Insecurity: No Food Insecurity (07/27/2022)   Hunger Vital Sign    Worried About Running Out of Food in the Last Year: Never true    Ran Out of Food in the Last Year: Never true  Transportation Needs: No Transportation Needs (07/27/2022)   PRAPARE - Hydrologist (Medical): No    Lack of Transportation (Non-Medical): No  Physical Activity: Inactive (07/27/2022)   Exercise Vital Sign    Days of Exercise per Week: 0 days    Minutes of Exercise per Session: 0 min  Stress: No Stress Concern Present (07/27/2022)   [place]    Feeling of Stress : Not at all  Social Connections: Moderately Integrated (07/27/2022)   Social Connection and Isolation Panel [NHANES]    Frequency of Communication with Friends and Family: More than three times a week    Frequency of Social Gatherings with Friends and Family: More than three times a week    Attends Religious Services: 1 to 4 times per year    Active Member of [employer] or Organizations: No    Attends Archivist Meetings: Never    Marital Status: Married  Human resources officer Violence: Not At Risk (07/27/2022)   Humiliation, Afraid, Rape, and Kick questionnaire    Fear of Current or Ex-Partner: No    Emotionally Abused: No    Physically Abused: No    Sexually Abused: No    Review of Systems: Positive for none All other review of systems negative except as mentioned in the HPI.  Physical Exam: Vital signs in last 24 hours: @VSRANGES @   General:   Alert,  Well-developed, well-nourished, pleasant and cooperative in NAD Lungs:  Clear throughout to auscultation.   Heart:  Regular rate and rhythm; no murmurs, clicks, rubs,  or gallops. Abdomen:  Soft, nontender and nondistended. Normal bowel sounds.   Neuro/Psych:  Alert and cooperative. Normal mood and affect. A and O x 3    No significant changes were  identified.  The patient continues to be an appropriate candidate for the planned procedure and anesthesia.   Carmell Austria, MD. [facility] 01/25/2023 8:31 AM@ \

## 2023-01-25 NOTE — Progress Notes (Signed)
Called to room to assist during endoscopic procedure.  Patient ID and intended procedure confirmed with present staff. Received instructions for my participation in the procedure from the performing physician.  

## 2023-01-25 NOTE — Progress Notes (Signed)
Pt's states no medical or surgical changes since previsit or office visit. VS assessed by D.T 

## 2023-01-25 NOTE — Op Note (Signed)
[place] Patient Name: Colleen Larsen Procedure Date: 01/25/2023 8:28 AM MRN: [id number] Endoscopist: Jackquline Denmark , MD, [id number] Age: 57 Referring MD:  Date of Birth: 04/15/1966 Gender: Female Account #: [id number] Procedure:                Colonoscopy Indications:              Screening for colorectal malignant neoplasm Medicines:                Monitored Anesthesia Care Procedure:                Pre-Anesthesia Assessment:                           - Prior to the procedure, a History and Physical                            was performed, and patient medications and                            allergies were reviewed. The patient's tolerance of                            previous anesthesia was also reviewed. The risks                            and benefits of the procedure and the sedation                            options and risks were discussed with the patient.                            All questions were answered, and informed consent                            was obtained. Prior Anticoagulants: The patient has                            taken no anticoagulant or antiplatelet agents. ASA                            Grade Assessment: II - A patient with mild systemic                            disease. After reviewing the risks and benefits,                            the patient was deemed in satisfactory condition to                            undergo the procedure.                           After obtaining informed consent, the colonoscope  was passed under direct vision. Throughout the                            procedure, the patient's blood pressure, pulse, and                            oxygen saturations were monitored continuously. The                            Olympus [id number]-H190DL [id number] Colonoscope was                            introduced through the anus and advanced to the the                            cecum,  identified by appendiceal orifice and                            ileocecal valve. The colonoscopy was performed                            without difficulty. The patient tolerated the                            procedure well. The quality of the bowel                            preparation was good. The ileocecal valve,                            appendiceal orifice, and rectum were photographed. Scope In: 8:35:14 AM Scope Out: 8:53:50 AM Scope Withdrawal Time: 0 hours 14 minutes 1 second  Total Procedure Duration: 0 hours 18 minutes 36 seconds  Findings:                 Three sessile polyps were found in the proximal                            transverse colon, proximal ascending colon and mid                            ascending colon. The polyps were 6 to 10 mm in                            size. These polyps were removed with a cold snare.                            Resection and retrieval were complete.                           A 6 mm polyp was found in the recto-sigmoid colon.                            The polyp was  sessile. The polyp was removed with a                            cold snare. Resection and retrieval were complete.                           A few rare small-mouthed diverticula were found in                            the sigmoid colon.                           Non-bleeding internal hemorrhoids were found during                            retroflexion. The hemorrhoids were small and Grade                            I (internal hemorrhoids that do not prolapse).                           The exam was otherwise without abnormality on                            direct and retroflexion views. Complications:            No immediate complications. Estimated Blood Loss:     Estimated blood loss: none. Impression:               - Three 6 to 10 mm polyps in the proximal                            transverse colon, in the proximal ascending colon                             and in the mid ascending colon, removed with a cold                            snare. Resected and retrieved.                           - One 6 mm polyp at the recto-sigmoid colon,                            removed with a cold snare. Resected and retrieved.                           - Minimal sigmoid diverticulosis.                           - Non-bleeding internal hemorrhoids.                           - The examination was otherwise normal on direct  and retroflexion views. Recommendation:           - Patient has a contact number available for                            emergencies. The signs and symptoms of potential                            delayed complications were discussed with the                            patient. Return to normal activities tomorrow.                            Written discharge instructions were provided to the                            patient.                           - Resume previous diet.                           - Continue present medications.                           - MiraLAX 17 g p.o. daily for opioid induced                            constipation.                           - Await pathology results.                           - Repeat colonoscopy for surveillance based on                            pathology results.                           - The findings and recommendations were discussed                            with the patient's family. Jackquline Denmark, MD 01/25/2023 8:58:00 AM This report has been signed electronically.

## 2023-01-25 NOTE — Progress Notes (Signed)
Uneventful anesthetic. Report to pacu rn. Vss. Care resumed by rn. 

## 2023-01-26 ENCOUNTER — Telehealth: Payer: Self-pay

## 2023-01-26 NOTE — Telephone Encounter (Signed)
Left HIPAA-compliant voicemail

## 2023-01-30 ENCOUNTER — Encounter: Payer: Self-pay | Admitting: Gastroenterology

## 2023-01-31 ENCOUNTER — Other Ambulatory Visit: Payer: Self-pay

## 2023-01-31 ENCOUNTER — Other Ambulatory Visit: Payer: Self-pay | Admitting: Family Medicine

## 2023-01-31 DIAGNOSIS — N952 Postmenopausal atrophic vaginitis: Secondary | ICD-10-CM

## 2023-01-31 DIAGNOSIS — K219 Gastro-esophageal reflux disease without esophagitis: Secondary | ICD-10-CM

## 2023-01-31 DIAGNOSIS — J309 Allergic rhinitis, unspecified: Secondary | ICD-10-CM

## 2023-01-31 DIAGNOSIS — E7849 Other hyperlipidemia: Secondary | ICD-10-CM

## 2023-01-31 DIAGNOSIS — I10 Essential (primary) hypertension: Secondary | ICD-10-CM

## 2023-01-31 MED ORDER — MONTELUKAST SODIUM 10 MG PO TABS: 10.0000 mg | ORAL_TABLET | Freq: Every day | ORAL | 3 refills | Status: DC

## 2023-01-31 MED ORDER — HYDROCHLOROTHIAZIDE 25 MG PO TABS: 25.0000 mg | ORAL_TABLET | Freq: Every day | ORAL | 1 refills | Status: DC

## 2023-01-31 MED ORDER — ESTRADIOL 0.1 MG/GM VA CREA: 0.5000 g | TOPICAL_CREAM | VAGINAL | 11 refills | Status: DC

## 2023-01-31 MED ORDER — ROSUVASTATIN CALCIUM 5 MG PO TABS: 5.0000 mg | ORAL_TABLET | Freq: Every day | ORAL | 1 refills | Status: DC

## 2023-01-31 MED ORDER — LOSARTAN POTASSIUM 50 MG PO TABS: ORAL_TABLET | ORAL | 0 refills | Status: DC

## 2023-01-31 MED ORDER — PANTOPRAZOLE SODIUM 40 MG PO TBEC: 40.0000 mg | DELAYED_RELEASE_TABLET | Freq: Every day | ORAL | 1 refills | Status: DC

## 2023-02-04 ENCOUNTER — Encounter (HOSPITAL_BASED_OUTPATIENT_CLINIC_OR_DEPARTMENT_OTHER): Payer: Self-pay | Admitting: Obstetrics and Gynecology

## 2023-02-04 NOTE — Progress Notes (Signed)
Spoke w/ via phone for pre-op interview--- pt Lab needs dos---- [employer], ekg              Lab results------ no COVID test -----patient states asymptomatic no test needed Arrive at ------- 1030 on 02-14-2023 NPO after MN NO Solid Food.  Clear liquids from MN until--- 0930 Med rec completed Medications to take morning of surgery ----- estradiol, protonix, trospium, astelin nasal spray Diabetic medication ----- n/a Patient instructed no nail polish to be worn day of surgery Patient instructed to bring photo id and insurance card day of surgery Patient aware to have Driver (ride ) / caregiver    for 24 hours after surgery --- daughter, Jenel Lucks Patient Special Instructions ----- n/a Pre-Op special Istructions ----- n/a Patient verbalized understanding of instructions that were given at this phone interview. Patient denies shortness of breath, chest pain, fever, cough at this phone interview.

## 2023-02-14 ENCOUNTER — Other Ambulatory Visit: Payer: Self-pay

## 2023-02-14 ENCOUNTER — Encounter (HOSPITAL_BASED_OUTPATIENT_CLINIC_OR_DEPARTMENT_OTHER)
Admission: RE | Disposition: A | Discharge status: Home / Self Care | Payer: Self-pay | Source: Home / Self Care | Attending: Obstetrics and Gynecology

## 2023-02-14 ENCOUNTER — Ambulatory Visit (HOSPITAL_BASED_OUTPATIENT_CLINIC_OR_DEPARTMENT_OTHER)
Admission: RE | Admit: 2023-02-14 | Discharge: 2023-02-14 | Disposition: A | Discharge status: Home / Self Care | Payer: 59 | Attending: Obstetrics and Gynecology | Admitting: Obstetrics and Gynecology

## 2023-02-14 ENCOUNTER — Encounter (HOSPITAL_BASED_OUTPATIENT_CLINIC_OR_DEPARTMENT_OTHER): Payer: Self-pay | Admitting: Obstetrics and Gynecology

## 2023-02-14 ENCOUNTER — Ambulatory Visit (HOSPITAL_BASED_OUTPATIENT_CLINIC_OR_DEPARTMENT_OTHER): Payer: 59 | Admitting: Anesthesiology

## 2023-02-14 ENCOUNTER — Telehealth: Payer: Self-pay | Admitting: Obstetrics and Gynecology

## 2023-02-14 DIAGNOSIS — Z79899 Other long term (current) drug therapy: Secondary | ICD-10-CM | POA: Insufficient documentation

## 2023-02-14 DIAGNOSIS — M1990 Unspecified osteoarthritis, unspecified site: Secondary | ICD-10-CM | POA: Insufficient documentation

## 2023-02-14 DIAGNOSIS — N393 Stress incontinence (female) (male): Secondary | ICD-10-CM | POA: Insufficient documentation

## 2023-02-14 DIAGNOSIS — F17210 Nicotine dependence, cigarettes, uncomplicated: Secondary | ICD-10-CM | POA: Insufficient documentation

## 2023-02-14 DIAGNOSIS — F32A Depression, unspecified: Secondary | ICD-10-CM | POA: Insufficient documentation

## 2023-02-14 DIAGNOSIS — F419 Anxiety disorder, unspecified: Secondary | ICD-10-CM | POA: Diagnosis not present

## 2023-02-14 DIAGNOSIS — J449 Chronic obstructive pulmonary disease, unspecified: Secondary | ICD-10-CM | POA: Diagnosis not present

## 2023-02-14 DIAGNOSIS — I10 Essential (primary) hypertension: Secondary | ICD-10-CM | POA: Diagnosis not present

## 2023-02-14 DIAGNOSIS — Z01818 Encounter for other preprocedural examination: Secondary | ICD-10-CM

## 2023-02-14 DIAGNOSIS — K219 Gastro-esophageal reflux disease without esophagitis: Secondary | ICD-10-CM | POA: Insufficient documentation

## 2023-02-14 HISTORY — DX: Presence of spectacles and contact lenses: Z97.3

## 2023-02-14 HISTORY — DX: Overactive bladder: N32.81

## 2023-02-14 HISTORY — DX: Other seasonal allergic rhinitis: J30.2

## 2023-02-14 HISTORY — DX: Unspecified osteoarthritis, unspecified site: M19.90

## 2023-02-14 HISTORY — PX: CYSTOSCOPY: SHX5120

## 2023-02-14 HISTORY — DX: Mixed incontinence: N39.46

## 2023-02-14 HISTORY — DX: Personal history of colonic polyps: Z86.010

## 2023-02-14 HISTORY — DX: Personal history of adenomatous and serrated colon polyps: Z86.0101

## 2023-02-14 HISTORY — DX: Generalized anxiety disorder: F41.1

## 2023-02-14 HISTORY — DX: Mixed hyperlipidemia: E78.2

## 2023-02-14 HISTORY — PX: BLADDER SUSPENSION: SHX72

## 2023-02-14 HISTORY — DX: Major depressive disorder, single episode, unspecified: F32.9

## 2023-02-14 LAB — POCT I-STAT, CHEM 8
BUN: 12 mg/dL (ref 6–20)
Calcium, Ion: 1.23 mmol/L (ref 1.15–1.40)
Chloride: 100 mmol/L (ref 98–111)
Creatinine, Ser: 0.5 mg/dL (ref 0.44–1.00)
Glucose, Bld: 95 mg/dL (ref 70–99)
HCT: 42 % (ref 36.0–46.0)
Hemoglobin: 14.3 g/dL (ref 12.0–15.0)
Potassium: 3 mmol/L — ABNORMAL LOW (ref 3.5–5.1)
Sodium: 140 mmol/L (ref 135–145)
TCO2: 28 mmol/L (ref 22–32)

## 2023-02-14 SURGERY — URETHROPEXY, USING TRANSVAGINAL TAPE
Anesthesia: General | Site: Vagina

## 2023-02-14 MED ORDER — GABAPENTIN 300 MG PO CAPS
300.0000 mg | ORAL_CAPSULE | ORAL | Status: AC
  Administered 2023-02-14: 300 mg via ORAL

## 2023-02-14 MED ORDER — PROPOFOL 10 MG/ML IV BOLUS
INTRAVENOUS | Status: AC
  Filled 2023-02-14: qty 20

## 2023-02-14 MED ORDER — MIDAZOLAM HCL 2 MG/2ML IJ SOLN
INTRAMUSCULAR | Status: AC
  Filled 2023-02-14: qty 2

## 2023-02-14 MED ORDER — DEXAMETHASONE SODIUM PHOSPHATE 10 MG/ML IJ SOLN
INTRAMUSCULAR | Status: DC | PRN
  Administered 2023-02-14: 5 mg via INTRAVENOUS

## 2023-02-14 MED ORDER — PHENYLEPHRINE 80 MCG/ML (10ML) SYRINGE FOR IV PUSH (FOR BLOOD PRESSURE SUPPORT)
PREFILLED_SYRINGE | INTRAVENOUS | Status: AC
  Filled 2023-02-14: qty 10

## 2023-02-14 MED ORDER — 0.9 % SODIUM CHLORIDE (POUR BTL) OPTIME
TOPICAL | Status: DC | PRN
  Administered 2023-02-14: 500 mL

## 2023-02-14 MED ORDER — PHENAZOPYRIDINE HCL 100 MG PO TABS
200.0000 mg | ORAL_TABLET | ORAL | Status: AC
  Administered 2023-02-14: 200 mg via ORAL

## 2023-02-14 MED ORDER — DIPHENHYDRAMINE HCL 50 MG/ML IJ SOLN
INTRAMUSCULAR | Status: DC | PRN
  Administered 2023-02-14: 12.5 mg via INTRAVENOUS

## 2023-02-14 MED ORDER — LACTATED RINGERS IV SOLN: INTRAVENOUS | Status: DC

## 2023-02-14 MED ORDER — PROPOFOL 10 MG/ML IV BOLUS
INTRAVENOUS | Status: DC | PRN
  Administered 2023-02-14: 50 mg via INTRAVENOUS

## 2023-02-14 MED ORDER — ONDANSETRON HCL 4 MG/2ML IJ SOLN
INTRAMUSCULAR | Status: DC | PRN
  Administered 2023-02-14: 4 mg via INTRAVENOUS

## 2023-02-14 MED ORDER — LIDOCAINE 2% (20 MG/ML) 5 ML SYRINGE
INTRAMUSCULAR | Status: DC | PRN
  Administered 2023-02-14: 60 mg via INTRAVENOUS

## 2023-02-14 MED ORDER — ACETAMINOPHEN 500 MG PO TABS
ORAL_TABLET | ORAL | Status: AC
  Filled 2023-02-14: qty 2

## 2023-02-14 MED ORDER — GABAPENTIN 300 MG PO CAPS
ORAL_CAPSULE | ORAL | Status: AC
  Filled 2023-02-14: qty 1

## 2023-02-14 MED ORDER — SODIUM CHLORIDE 0.9 % IR SOLN
Status: DC | PRN
  Administered 2023-02-14: 1000 mL via INTRAVESICAL

## 2023-02-14 MED ORDER — PHENAZOPYRIDINE HCL 100 MG PO TABS
ORAL_TABLET | ORAL | Status: AC
  Filled 2023-02-14: qty 2

## 2023-02-14 MED ORDER — CEFAZOLIN SODIUM-DEXTROSE 2-4 GM/100ML-% IV SOLN
INTRAVENOUS | Status: AC
  Filled 2023-02-14: qty 100

## 2023-02-14 MED ORDER — PHENYLEPHRINE 80 MCG/ML (10ML) SYRINGE FOR IV PUSH (FOR BLOOD PRESSURE SUPPORT)
PREFILLED_SYRINGE | INTRAVENOUS | Status: DC | PRN
  Administered 2023-02-14 (×2): 160 ug via INTRAVENOUS

## 2023-02-14 MED ORDER — FENTANYL CITRATE (PF) 100 MCG/2ML IJ SOLN
INTRAMUSCULAR | Status: AC
  Filled 2023-02-14: qty 2

## 2023-02-14 MED ORDER — FENTANYL CITRATE (PF) 100 MCG/2ML IJ SOLN
INTRAMUSCULAR | Status: DC | PRN
  Administered 2023-02-14 (×2): 50 ug via INTRAVENOUS

## 2023-02-14 MED ORDER — DEXAMETHASONE SODIUM PHOSPHATE 10 MG/ML IJ SOLN
INTRAMUSCULAR | Status: AC
  Filled 2023-02-14: qty 1

## 2023-02-14 MED ORDER — MIDAZOLAM HCL 5 MG/5ML IJ SOLN
INTRAMUSCULAR | Status: DC | PRN
  Administered 2023-02-14: 2 mg via INTRAVENOUS

## 2023-02-14 MED ORDER — ONDANSETRON HCL 4 MG/2ML IJ SOLN
INTRAMUSCULAR | Status: AC
  Filled 2023-02-14: qty 2

## 2023-02-14 MED ORDER — LIDOCAINE-EPINEPHRINE 1 %-1:100000 IJ SOLN
INTRAMUSCULAR | Status: DC | PRN
  Administered 2023-02-14: 10 mL

## 2023-02-14 MED ORDER — LIDOCAINE HCL (PF) 2 % IJ SOLN
INTRAMUSCULAR | Status: AC
  Filled 2023-02-14: qty 5

## 2023-02-14 MED ORDER — CEFAZOLIN SODIUM-DEXTROSE 2-4 GM/100ML-% IV SOLN
2.0000 g | INTRAVENOUS | Status: AC
  Administered 2023-02-14: 2 g via INTRAVENOUS

## 2023-02-14 MED ORDER — ACETAMINOPHEN 500 MG PO TABS
1000.0000 mg | ORAL_TABLET | ORAL | Status: AC
  Administered 2023-02-14: 1000 mg via ORAL

## 2023-02-14 MED ORDER — SUCCINYLCHOLINE CHLORIDE 200 MG/10ML IV SOSY
PREFILLED_SYRINGE | INTRAVENOUS | Status: AC
  Filled 2023-02-14: qty 10

## 2023-02-14 MED ORDER — FENTANYL CITRATE (PF) 100 MCG/2ML IJ SOLN: 25.0000 ug | INTRAMUSCULAR | Status: DC | PRN

## 2023-02-14 SURGICAL SUPPLY — 36 items
ADH SKN CLS APL DERMABOND .7 (GAUZE/BANDAGES/DRESSINGS) ×2
AGENT HMST KT MTR STRL THRMB (HEMOSTASIS)
BLADE CLIPPER SENSICLIP SURGIC (BLADE) ×2 IMPLANT
BLADE SURG 15 STRL LF DISP TIS (BLADE) ×2 IMPLANT
BLADE SURG 15 STRL SS (BLADE) ×2
DERMABOND ADVANCED .7 DNX12 (GAUZE/BANDAGES/DRESSINGS) ×2 IMPLANT
ELECT REM PT RETURN 9FT ADLT (ELECTROSURGICAL)
ELECTRODE REM PT RTRN 9FT ADLT (ELECTROSURGICAL) IMPLANT
GAUZE 4X4 16PLY ~~LOC~~+RFID DBL (SPONGE) IMPLANT
GLOVE BIOGEL PI IND STRL 6.5 (GLOVE) ×2 IMPLANT
GLOVE ECLIPSE 6.0 STRL STRAW (GLOVE) ×2 IMPLANT
GOWN STRL REUS W/TWL LRG LVL3 (GOWN DISPOSABLE) ×2 IMPLANT
HIBICLENS CHG 4% 4OZ BTL (MISCELLANEOUS) ×2 IMPLANT
HOLDER FOLEY CATH W/STRAP (MISCELLANEOUS) ×2 IMPLANT
KIT TURNOVER CYSTO (KITS) ×2 IMPLANT
MANIFOLD NEPTUNE II (INSTRUMENTS) ×2 IMPLANT
NDL HYPO 22X1.5 SAFETY MO (MISCELLANEOUS) ×2 IMPLANT
NEEDLE HYPO 22X1.5 SAFETY MO (MISCELLANEOUS) ×2 IMPLANT
NS IRRIG 1000ML POUR BTL (IV SOLUTION) ×2 IMPLANT
PACK CYSTO (CUSTOM PROCEDURE TRAY) ×2 IMPLANT
PACK VAGINAL WOMENS (CUSTOM PROCEDURE TRAY) ×2 IMPLANT
RETRACTOR LONE STAR DISPOSABLE (INSTRUMENTS) ×2 IMPLANT
RETRACTOR STAY HOOK 5MM (MISCELLANEOUS) ×2 IMPLANT
SET IRRIG Y TYPE TUR BLADDER L (SET/KITS/TRAYS/PACK) ×2 IMPLANT
SLEEVE SCD COMPRESS KNEE MED (STOCKING) ×2 IMPLANT
SPIKE FLUID TRANSFER (MISCELLANEOUS) ×2 IMPLANT
SUCTION FRAZIER HANDLE 10FR (MISCELLANEOUS) ×2
SUCTION TUBE FRAZIER 10FR DISP (MISCELLANEOUS) ×2 IMPLANT
SURGIFLO W/THROMBIN 8M KIT (HEMOSTASIS) IMPLANT
SUT ABS MONO DBL WITH NDL 48IN (SUTURE) IMPLANT
SUT VIC AB 2-0 SH 27 (SUTURE) ×2
SUT VIC AB 2-0 SH 27XBRD (SUTURE) ×2 IMPLANT
SYR BULB EAR ULCER 3OZ GRN STR (SYRINGE) ×2 IMPLANT
SYS SLING ADV FIT BLUE TRNSVAG (Sling) IMPLANT
TOWEL OR 17X24 6PK STRL BLUE (TOWEL DISPOSABLE) ×2 IMPLANT
TRAY FOLEY W/BAG SLVR 14FR LF (SET/KITS/TRAYS/PACK) ×2 IMPLANT

## 2023-02-14 NOTE — Discharge Instructions (Addendum)
POST OPERATIVE INSTRUCTIONS  General Instructions Recovery (not bed rest) will last approximately 6 weeks Walking is encouraged, but refrain from strenuous exercise/ housework/ heavy lifting for 2 weeks. No lifting >10lbs  Nothing in the vagina- NO intercourse, tampons or douching for 6 weeks Bathing:  Do not submerge in water (NO swimming, bath, hot tub, etc) until after your postop visit. You can shower starting the day after surgery.  No driving until you are not taking narcotic pain medicine and until your pain is well enough controlled that you can slam on the breaks or make sudden movements if needed.   Taking your medications Please take your acetaminophen and ibuprofen on a schedule for the first 48 hours. Take '600mg'$  ibuprofen, then take '500mg'$  acetaminophen 3 hours later, then continue to alternate ibuprofen and acetaminophen. That way you are taking each type of medication every 6 hours. Take the prescribed narcotic (oxycodone, tramadol, etc) as needed, with a maximum being every 4 hours.  Take a stool softener daily to keep your stools soft and preventing you from straining. If you have diarrhea, you decrease your stool softener. This is explained more below. We have prescribed you Miralax.  Reasons to Call the Nurse (see last page for phone numbers) Heavy Bleeding (changing your pad every 1-2 hours) Persistent nausea/vomiting Fever (100.4 degrees or more) Incision problems (pus or other fluid coming out, redness, warmth, increased pain)  Things to Expect After Surgery Mild to Moderate pain is normal during the first day or two after surgery. If prescribed, take Ibuprofen or Tylenol first and use the stronger medicine for "break-through" pain. You can overlap these medicines because they work differently.   Constipation   To Prevent Constipation:  Eat a well-balanced diet including protein, grains, fresh fruit and vegetables.  Drink plenty of fluids. Walk regularly.  Depending on  specific instructions from your physician: take Miralax daily and additionally you can add a stool softener (colace/ docusate) and fiber supplement. Continue as long as you're on pain medications.   To Treat Constipation:  If you do not have a bowel movement in 2 days after surgery, you can take 2 Tbs of Milk of Magnesia 1-2 times a day until you have a bowel movement. If diarrhea occurs, decrease the amount or stop the laxative. If no results with Milk of Magnesia, you can drink a bottle of magnesium citrate which you can purchase over the counter.  Fatigue:  This is a normal response to surgery and will improve with time.  Plan frequent rest periods throughout the day.  Gas Pain:  This is very common but can also be very painful! Drink warm liquids such as herbal teas, bouillon or soup. Walking will help you pass more gas.  Mylicon or Gas-X can be taken over the counter.  Leaking Urine:  Varying amounts of leakage may occur after surgery.  This should improve with time. Your bladder needs at least 3 months to recover from surgery. If you leak after surgery, be sure to mention this to your doctor at your post-op visit. If you were taking medications for overactive bladder prior to surgery, be sure to restart the medications immediately after surgery.  Incisions: If you have incisions on your abdomen, the skin glue will dissolve on its own over time. It is ok to gently rinse with soap and water over these incisions but do not scrub.  Catheter Approximately 50% of patients are unable to urinate after surgery and need to go home with a  catheter. This allows your bladder to rest so it can return to full function. If you go home with a catheter, the office will call to set up a voiding trial a few days after surgery. For most patients, by this visit, they are able to urinate on their own. Long term catheter use is rare.   Return to Work  As work demands and recovery times vary widely, it is hard to  predict when you will want to return to work. If you have a desk job with no strenuous physical activity, and if you would like to return sooner than generally recommended, discuss this with your provider or call our office.   Post op concerns  For non-emergent issues, please call the Urogynecology Nurse. Please leave a message and someone will contact you within one business day.  You can also send a message through MyChart.   AFTER HOURS (After 5:00 PM and on weekends):  For urgent matters that cannot wait until the next business day. Call our office [phone] and connect to the doctor on call.  Please reserve this for important issues.   **FOR ANY TRUE EMERGENCY ISSUES CALL 911 OR GO TO THE NEAREST EMERGENCY ROOM.** Please inform our office or the doctor on call of any emergency.     APPOINTMENTS: Call [phone]   No acetaminophen/Tylenol until after 5:00pm today if needed for pain.     Post Anesthesia Home Care Instructions  Activity: Get plenty of rest for the remainder of the day. A responsible individual must stay with you for 24 hours following the procedure.  For the next 24 hours, DO NOT: -Drive a car -Advertising copywriter -Drink alcoholic beverages -Take any medication unless instructed by your physician -Make any legal decisions or sign important papers.  Meals: Start with liquid foods such as gelatin or soup. Progress to regular foods as tolerated. Avoid greasy, spicy, heavy foods. If nausea and/or vomiting occur, drink only clear liquids until the nausea and/or vomiting subsides. Call your physician if vomiting continues.  Special Instructions/Symptoms: Your throat may feel dry or sore from the anesthesia or the breathing tube placed in your throat during surgery. If this causes discomfort, gargle with warm salt water. The discomfort should disappear within 24 hours.

## 2023-02-14 NOTE — Anesthesia Procedure Notes (Addendum)
Procedure Name: LMA Insertion Date/Time: 02/14/2023 1:48 PM  Performed by: Bishop Limbo, CRNAPre-anesthesia Checklist: Patient identified, Emergency Drugs available, Suction available and Patient being monitored Patient Re-evaluated:Patient Re-evaluated prior to induction Oxygen Delivery Method: Circle System Utilized Preoxygenation: Pre-oxygenation with 100% oxygen Induction Type: IV induction Ventilation: Mask ventilation without difficulty LMA: LMA inserted LMA Size: 4.0 Number of attempts: 2 Placement Confirmation: positive ETCO2 Tube secured with: Tape Dental Injury: Bloody posterior oropharynx  Comments: Oropharynx suctioned on extubation. No overt trauma.

## 2023-02-14 NOTE — Interval H&P Note (Signed)
History and Physical Interval Note:  02/14/2023 1:21 PM  Colleen Larsen  has presented today for surgery, with the diagnosis of stress urinary incontinence.  The various methods of treatment have been discussed with the patient and family. After consideration of risks, benefits and other options for treatment, the patient has consented to  Procedure(s) with comments: TRANSVAGINAL TAPE (TVT) PROCEDURE (N/A)  CYSTOSCOPY (N/A) as a surgical intervention.  The patient's history has been reviewed, patient examined, no change in status, stable for surgery.  I have reviewed the patient's chart and labs.  Questions were answered to the patient's satisfaction.     Marguerita Beards

## 2023-02-14 NOTE — Telephone Encounter (Signed)
Colleen Larsen underwent midurethral sling, cystoscopy on 02/14/23.   She passed her voiding trial.  was backfilled into the bladder Voided  PVR by bladder scan was 3ml.   She was discharged without a catheter. Please call her for a routine post op check. Thanks!  Marguerita Beards, MD

## 2023-02-14 NOTE — Anesthesia Preprocedure Evaluation (Addendum)
Anesthesia Evaluation  Patient identified by MRN, date of birth, ID band Patient awake    Reviewed: Allergy & Precautions, H&P , NPO status , Patient's Chart, lab work & pertinent test results  Airway Mallampati: III  TM Distance: >3 FB Neck ROM: Full    Dental no notable dental hx. (+) Teeth Intact, Dental Advisory Given   Pulmonary COPD,  COPD inhaler, Current Smoker and Patient abstained from smoking.   Pulmonary exam normal breath sounds clear to auscultation       Cardiovascular hypertension, Pt. on medications  Rhythm:Regular Rate:Normal     Neuro/Psych   Anxiety Depression    negative neurological ROS  negative psych ROS   GI/Hepatic Neg liver ROS,GERD  Medicated,,  Endo/Other  negative endocrine ROS    Renal/GU negative Renal ROS  negative genitourinary   Musculoskeletal  (+) Arthritis , Osteoarthritis,    Abdominal   Peds  Hematology negative hematology ROS (+)   Anesthesia Other Findings   Reproductive/Obstetrics negative OB ROS                             Anesthesia Physical Anesthesia Plan  ASA: 2  Anesthesia Plan: General   Post-op Pain Management: Tylenol PO (pre-op)*   Induction: Intravenous  PONV Risk Score and Plan: 3 and Ondansetron, Dexamethasone and Midazolam  Airway Management Planned: LMA and Oral ETT  Additional Equipment:   Intra-op Plan:   Post-operative Plan: Extubation in OR  Informed Consent: I have reviewed the patients History and Physical, chart, labs and discussed the procedure including the risks, benefits and alternatives for the proposed anesthesia with the patient or authorized representative who has indicated his/her understanding and acceptance.     Dental advisory given  Plan Discussed with: CRNA  Anesthesia Plan Comments:        Anesthesia Quick Evaluation

## 2023-02-14 NOTE — Op Note (Signed)
Operative Note  Preoperative Diagnosis: stress urinary incontinence  Postoperative Diagnosis: same  Procedures performed:  Midurethral sling (Advantage Fit), cystoscopy  Implants:  Implant Name Type Inv. Item Serial No. Manufacturer Lot No. LRB No. Used Action  SYS SLING ADV FIT BLUE TRNSVAG - [id number] Sling SYS SLING ADV FIT BLUE TRNSVAG  [employer] [id number] N/A 1 Implanted    Attending Surgeon: Lanetta Inch, MD  Anesthesia: General LMA  Findings: Normal bladder and urethra without injury, lesion or foreign body. Brisk bilateral ureteral efflux noted.    Specimens: * No specimens in log *  Estimated blood loss: 25 mL  IV fluids: 800 mL  Urine output: see flowsheet  Complications: none  Procedure in Detail:  After informed consent was obtained, the patient was taken to the operating room where she was placed under anesthesia.  She was then placed in the dorsal lithotomy position with allen stirrups,  and prepped and draped in the usual sterile fashion.  A strap was placed across her upper abdomen on top of her gown so it was not in direct contact with her skin.  Care was taken to avoid hyperflexion or hyperextension of her upper and lower extremities. A foley catheter was placed.  A lonestar self-retraining retractor was placed with 4 stay hooks. The mid urethral area was located on the anterior vaginal wall.  Two Allis clamps were placed at the level of the midurethra. 1% lidocaine with epinephrine was injected into the vaginal mucosa. A vertical incision was made between the two clamps using a 15-blade scalpel.  Using sharp dissection, Metzenbaum scissors were used to make a periurethral tunnel from the vaginal incision towards the pubic rami bilaterally for the future sling tracts. The bladder was ensured to be empty. The trocar and attached sling were introduced into the right side of the periurethral vaginal incision, just inferior to the pubic symphysis on the right  side. The trocar was guided through the endopelvic fascia and directly vertically.  While hugging the cephalad surface of the pubic bone, the trocar was guided out through the abdomen 2 fingerbreadths lateral to midline at the level of the pubic symphysis on the ipsilateral side. The trocar was placed on the left side in a similar fashion.  A 70-degree cystoscope was introduced, and 360-degree inspection revealed no trauma or trocars in the bladder, with brisk bilateral ureteral efflux.  The bladder was drained and the cystoscope was removed.  The Foley catheter was reinserted.  The sling was brought to lie beneath the mid-urethra.  A needle driver was placed behind the sling to ensure no tension.   The plastic sheath was removed from the sling and the distal ends of the sling were trimmed just below the level of the skin incisions.  Tension-free positioning of the sling was confirmed. Vaginal inspection revealed no vaginotomy or sling perforations of the mucosa.  The vaginal mucosal edges were reapproximated using 2-0 Vicryl.  The vagina was copiously irrigated.  Hemostasis was again noted. Vaginal packing not placed. The suprapubic sling incisions were closed with Dermabond. The patient tolerated the procedure well.  She was awakened from anesthesia and transferred to the recovery room in stable condition. All needle and sponge counts were correct x 2.    Marguerita Beards, MD

## 2023-02-14 NOTE — Transfer of Care (Signed)
Immediate Anesthesia Transfer of Care Note  Patient: Colleen Larsen  Procedure(s) Performed: TRANSVAGINAL TAPE (TVT) PROCEDURE (Vagina ) CYSTOSCOPY  Patient Location: PACU  Anesthesia Type:General  Level of Consciousness: drowsy and responds to stimulation  Airway & Oxygen Therapy: Patient Spontanous Breathing  Post-op Assessment: Report given to RN and Post -op Vital signs reviewed and stable  Post vital signs: Reviewed and stable  Last Vitals:  Vitals Value Taken Time  BP 135/90 02/14/23 1426  Temp    Pulse 69 02/14/23 1427  Resp 12 02/14/23 1427  SpO2 90 % 02/14/23 1427  Vitals shown include unvalidated device data.  Last Pain:  Vitals:   02/14/23 1039  TempSrc: Oral         Complications: No notable events documented.

## 2023-02-15 NOTE — Anesthesia Postprocedure Evaluation (Signed)
Anesthesia Post Note  Patient: Colleen Larsen  Procedure(s) Performed: TRANSVAGINAL TAPE (TVT) PROCEDURE (Vagina ) CYSTOSCOPY     Patient location during evaluation: PACU Anesthesia Type: General Level of consciousness: awake and alert Pain management: pain level controlled Vital Signs Assessment: post-procedure vital signs reviewed and stable Respiratory status: spontaneous breathing, nonlabored ventilation and respiratory function stable Cardiovascular status: blood pressure returned to baseline and stable Postop Assessment: no apparent nausea or vomiting Anesthetic complications: no  No notable events documented.  Last Vitals:  Vitals:   02/14/23 1515 02/14/23 1603  BP: 137/80 120/84  Pulse: 71 69  Resp: 20 16  Temp: 36.4 C 36.7 C  SpO2: 95% 96%    Last Pain:  Vitals:   02/15/23 1024  TempSrc:   PainSc: 5                  Velda Wendt,W. EDMOND

## 2023-02-15 NOTE — Telephone Encounter (Signed)
Patient reports soreness with movement. Pain 5/10 and is taking ibuprofen and tylenol, reports the narcotic medication made her feel hyper.  Reports she is urinating normally. Denies leakage when she coughs.  Reports she has not yet been able to have a bowel movement but is taking Miralax. Endorses minimal bleeding but reports she thinks it has almost stopped.    Patient reports she has our office number in case she needs anything. Overall she feels she is doing well and will come for routine post op exam.

## 2023-02-16 ENCOUNTER — Encounter (HOSPITAL_BASED_OUTPATIENT_CLINIC_OR_DEPARTMENT_OTHER): Payer: Self-pay | Admitting: Obstetrics and Gynecology

## 2023-03-04 ENCOUNTER — Encounter: Payer: Self-pay | Admitting: Obstetrics and Gynecology

## 2023-03-04 ENCOUNTER — Ambulatory Visit (INDEPENDENT_AMBULATORY_CARE_PROVIDER_SITE_OTHER): Payer: 59 | Admitting: Obstetrics and Gynecology

## 2023-03-04 VITALS — BP 147/90 | HR 78

## 2023-03-04 DIAGNOSIS — Z9889 Other specified postprocedural states: Secondary | ICD-10-CM

## 2023-03-04 NOTE — Progress Notes (Signed)
[facility]  Date of Visit: 03/04/2023  History of Present Illness: Colleen Larsen is a 57 y.o. female scheduled today for a post-operative visit.   Surgery: s/p Midurethral sling (Advantage Fit), cystoscopy on 02/14/23  She passed her postoperative void trial.   Postoperative course has been uncomplicated.   Today she reports she is still having some leakage with cough about 1-2 times per day. Feels that she is emptying well. Denies urgency or frequency. No issues with bowel movements. Feels some pressure at that vaginal opening.    Medications: She has a current medication list which includes the following prescription(s): acetaminophen, albuterol, azelastine, cetirizine, escitalopram, estradiol, estradiol, fluticasone, hydrochlorothiazide, ibuprofen, ibuprofen, losartan, melatonin, montelukast, ondansetron, pantoprazole, rosuvastatin, trospium chloride, vitamin d (ergocalciferol), and polyethylene glycol powder.   Allergies: Patient is allergic to celebrex [celecoxib].   Physical Exam: BP (!) 147/90   Pulse 78    Suprapubic Incisions: healing well.  Pelvic Examination: Vagina: Incisions healing well. Sutures are present at incision line and there is not granulation tissue. No tenderness. No visible or palpable mesh.  POP-Q: Deferred- no prolapse noted  ---------------------------------------------------------  Assessment and Plan:  1. Post-operative state     - Healing well overall. We discussed she may still see further improvement of her leakage as she is healing.  - Can resume regular activity including exercise but continue pelvic rest for 4 weeks.  Return 4 weeks  Marguerita Beards, MD

## 2023-03-29 ENCOUNTER — Encounter: Payer: Self-pay | Admitting: Obstetrics and Gynecology

## 2023-03-30 ENCOUNTER — Ambulatory Visit (INDEPENDENT_AMBULATORY_CARE_PROVIDER_SITE_OTHER): Payer: 59 | Admitting: Obstetrics and Gynecology

## 2023-03-30 ENCOUNTER — Encounter: Payer: Self-pay | Admitting: Obstetrics and Gynecology

## 2023-03-30 VITALS — BP 141/86 | HR 90

## 2023-03-30 DIAGNOSIS — N3281 Overactive bladder: Secondary | ICD-10-CM

## 2023-03-30 DIAGNOSIS — Z9889 Other specified postprocedural states: Secondary | ICD-10-CM

## 2023-03-30 DIAGNOSIS — N393 Stress incontinence (female) (male): Secondary | ICD-10-CM

## 2023-03-30 MED ORDER — TROSPIUM CHLORIDE ER 60 MG PO CP24: 1.0000 | ORAL_CAPSULE | Freq: Every day | ORAL | 5 refills | Status: DC

## 2023-03-30 NOTE — Progress Notes (Signed)
 Urogynecology  Date of Visit: 03/30/2023  History of Present Illness: Colleen Larsen is a 57 y.o. female scheduled today for a post-operative visit.   Surgery: s/p Midurethral sling (Advantage Fit), cystoscopy on 02/14/23  She passed her postoperative void trial.   Postoperative course has been uncomplicated.   Today she reports she is having leakage. Leaking a few drops with strong cough 3-4 times a week. Reports it is better than prior to surgery.      Medications: She has a current medication list which includes the following prescription(s): acetaminophen, albuterol, azelastine, cetirizine, escitalopram, estradiol, estradiol, fluticasone, hydrochlorothiazide, ibuprofen, ibuprofen, losartan, melatonin, montelukast, ondansetron, pantoprazole, polyethylene glycol powder, rosuvastatin, trospium chloride, and vitamin d (ergocalciferol).   Allergies: Patient is allergic to celebrex [celecoxib].   Physical Exam: BP (!) 141/86   Pulse 90    Suprapubic Incisions: healing well.  Pelvic Examination: Vagina: Incisions healing well. Sutures are present (3 sutures noted to still be dissolving) at incision line and there is not granulation tissue. No tenderness. No visible or palpable mesh.  POP-Q: Deferred- no prolapse noted  ---------------------------------------------------------  Assessment and Plan:  1. Post-operative state   2. Overactive bladder   3. SUI (stress urinary incontinence, female)      - Healing well overall. We discussed she may still see further improvement of her leakage as she is healing.  - Can resume regular activity including exercise but delay intercourse for another 3 weeks. Same for swimming or hot tub. Patient to use her estrogen cream x2 weekly for the next 3 weeks then can resume sexual activity.    Return PRN  Selmer Dominion, NP

## 2023-03-30 NOTE — Patient Instructions (Signed)
Use the estrogen cream twice a week for the next 3 weeks. After that your sutures should be healed for you to swim, have intercourse, and get into a hot tub if you desire. They are about 75% healed at this point. Listen to your body and if you need to rest please do so. Try not to lift things greater than 25lbs for a few more weeks.

## 2023-04-18 ENCOUNTER — Other Ambulatory Visit: Payer: Self-pay | Admitting: Family Medicine

## 2023-04-18 DIAGNOSIS — I10 Essential (primary) hypertension: Secondary | ICD-10-CM

## 2023-05-16 ENCOUNTER — Other Ambulatory Visit: Payer: Self-pay | Admitting: Obstetrics and Gynecology

## 2023-05-16 ENCOUNTER — Encounter: Payer: Self-pay | Admitting: Obstetrics and Gynecology

## 2023-05-16 DIAGNOSIS — N3281 Overactive bladder: Secondary | ICD-10-CM

## 2023-05-16 MED ORDER — TROSPIUM CHLORIDE ER 60 MG PO CP24
1.0000 | ORAL_CAPSULE | Freq: Every day | ORAL | 3 refills | Status: DC
Start: 2023-05-16 — End: 2024-04-10

## 2023-05-16 NOTE — Progress Notes (Signed)
30 day medication prescription changed to 90 day at patient request.

## 2023-05-25 ENCOUNTER — Encounter: Payer: 59 | Admitting: Nurse Practitioner

## 2023-05-25 ENCOUNTER — Encounter: Payer: 59 | Admitting: Physician Assistant

## 2023-05-25 ENCOUNTER — Encounter: Payer: Self-pay | Admitting: Physician Assistant

## 2023-05-25 ENCOUNTER — Ambulatory Visit (INDEPENDENT_AMBULATORY_CARE_PROVIDER_SITE_OTHER): Payer: 59 | Admitting: Physician Assistant

## 2023-05-25 VITALS — BP 124/78 | HR 89 | Temp 97.9°F | Resp 14 | Ht 65.0 in | Wt 182.0 lb

## 2023-05-25 DIAGNOSIS — F411 Generalized anxiety disorder: Secondary | ICD-10-CM

## 2023-05-25 DIAGNOSIS — Z780 Asymptomatic menopausal state: Secondary | ICD-10-CM | POA: Diagnosis not present

## 2023-05-25 DIAGNOSIS — Z716 Tobacco abuse counseling: Secondary | ICD-10-CM | POA: Diagnosis not present

## 2023-05-25 DIAGNOSIS — J320 Chronic maxillary sinusitis: Secondary | ICD-10-CM | POA: Insufficient documentation

## 2023-05-25 DIAGNOSIS — E785 Hyperlipidemia, unspecified: Secondary | ICD-10-CM

## 2023-05-25 DIAGNOSIS — Z0000 Encounter for general adult medical examination without abnormal findings: Secondary | ICD-10-CM | POA: Diagnosis not present

## 2023-05-25 DIAGNOSIS — I10 Essential (primary) hypertension: Secondary | ICD-10-CM

## 2023-05-25 LAB — POCT URINALYSIS DIP (CLINITEK)
Bilirubin, UA: NEGATIVE
Blood, UA: NEGATIVE
Glucose, UA: NEGATIVE mg/dL
Ketones, POC UA: NEGATIVE mg/dL
Leukocytes, UA: NEGATIVE
Nitrite, UA: NEGATIVE
POC PROTEIN,UA: NEGATIVE
Spec Grav, UA: 1.02 (ref 1.010–1.025)
Urobilinogen, UA: 0.2 E.U./dL
pH, UA: 6 (ref 5.0–8.0)

## 2023-05-25 MED ORDER — VARENICLINE TARTRATE (STARTER) 0.5 MG X 11 & 1 MG X 42 PO TBPK
ORAL_TABLET | ORAL | 3 refills | Status: AC
Start: 2023-05-25 — End: 2023-06-22

## 2023-05-25 MED ORDER — AMOXICILLIN-POT CLAVULANATE 875-125 MG PO TABS
1.0000 | ORAL_TABLET | Freq: Two times a day (BID) | ORAL | 0 refills | Status: DC
Start: 2023-05-25 — End: 2023-06-27

## 2023-05-25 MED ORDER — BUPROPION HCL ER (SR) 100 MG PO TB12
100.0000 mg | ORAL_TABLET | Freq: Two times a day (BID) | ORAL | 2 refills | Status: DC
Start: 2023-05-25 — End: 2023-09-01

## 2023-05-25 MED ORDER — LOSARTAN POTASSIUM 50 MG PO TABS
ORAL_TABLET | ORAL | 2 refills | Status: DC
Start: 2023-05-25 — End: 2023-10-17

## 2023-05-25 MED ORDER — ESTRADIOL 0.5 MG PO TABS: 0.5000 mg | ORAL_TABLET | Freq: Every day | ORAL | 2 refills | Status: DC

## 2023-05-25 NOTE — Addendum Note (Signed)
Addended by: Langley Gauss on: 05/25/2023 04:47 PM   Modules accepted: Orders

## 2023-05-25 NOTE — Progress Notes (Signed)
Subjective:  Patient ID: Colleen Larsen, female    DOB: 08/07/1966  Age: 57 y.o. MRN: [id number]  Chief Complaint  Patient presents with   Annual Exam    HPI Well Adult Physical: Patient here for a comprehensive physical exam.The patient reports problems - nasal congestion, ear fullness and lightheaded Do you take any herbs or supplements that were not prescribed by a doctor? no Are you taking calcium supplements? no Are you taking aspirin daily? No  Patient is interested in smoking cessation. We will begin with Chantix and Wellbutrin 100mg  and follow up in 1 month to see how she is doing.    Encounter for general adult medical examination without abnormal findings  Physical ("At Risk" items are starred): Patient's last physical exam was 1 year ago .  Patient is not afflicted from Stress Incontinence and Urge Incontinence  Patient wears a seat belts Patient has smoke detectors and has carbon monoxide detectors. Patient practices appropriate gun safety. Patient wears sunscreen with extended sun exposure. Dental Care: biannual cleanings, brushes and flosses daily. Ophthalmology/Optometry: Annual visit.  Hearing loss: none Vision impairments: none  Menarche: 57 years old Menstrual History: regular LMP: Hysterectomy Pregnancy history: G3P2 Safe at home: yes Self breast exams: monthly     11/24/2022   10:01 AM 08/19/2022    8:29 AM 07/27/2022    8:24 AM 07/27/2022    8:07 AM 04/14/2022    2:52 PM  Depression screen PHQ 2/9  Decreased Interest 0 1 1 0 0  Down, Depressed, Hopeless 0 1 1 0 0  PHQ - 2 Score 0 2 2 0 0  Altered sleeping  1 1 0   Tired, decreased energy  1 1 0   Change in appetite  0 1 0   Feeling bad or failure about yourself   0 1 0   Trouble concentrating  0 1 0   Moving slowly or fidgety/restless  0 0 0   Suicidal thoughts  0 0 0   PHQ-9 Score  4 7 0   Difficult doing work/chores  Somewhat difficult Not difficult at all Not difficult at all           08/02/2018    9:02 AM 04/14/2022    2:52 PM 07/27/2022    8:07 AM 11/24/2022   10:01 AM  Fall Risk  Falls in the past year?  0 0 0  Was there an injury with Fall?  0 0 0  Fall Risk Category Calculator  0 0 0  Fall Risk Category (Retired)  Low Low   (RETIRED) Patient Fall Risk Level Low fall risk Low fall risk Low fall risk   Patient at Risk for Falls Due to  Impaired vision No Fall Risks No Fall Risks  Fall risk Follow up  Falls evaluation completed;Falls prevention discussed Falls evaluation completed Falls evaluation completed             Social Hx   Social History   Socioeconomic History   Marital status: Married    Spouse name: Not on file   Number of children: Not on file   Years of education: Not on file   Highest education level: Not on file  Occupational History   Not on file  Tobacco Use   Smoking status: Every Day    Current packs/day: 1.00    Average packs/day: 1 pack/day for 30.0 years (30.0 ttl pk-yrs)    Types: Cigarettes   Smokeless tobacco: Never  Vaping Use  Vaping status: Some Days   Substances: Nicotine, Flavoring   Devices: buse  Substance and Sexual Activity   Alcohol use: Yes    Comment: RARE   Drug use: Never   Sexual activity: Yes    Partners: Male    Birth control/protection: Surgical  Other Topics Concern   Not on file  Social History Narrative   Not on file   Social Determinants of Health   Financial Resource Strain: Low Risk  (07/27/2022)   Overall Financial Resource Strain (CARDIA)    Difficulty of Paying Living Expenses: Not hard at all  Food Insecurity: No Food Insecurity (07/27/2022)   Hunger Vital Sign    Worried About Running Out of Food in the Last Year: Never true    Ran Out of Food in the Last Year: Never true  Transportation Needs: No Transportation Needs (07/27/2022)   PRAPARE - Administrator, Civil Service (Medical): No    Lack of Transportation (Non-Medical): No  Physical Activity: Inactive (07/27/2022)    Exercise Vital Sign    Days of Exercise per Week: 0 days    Minutes of Exercise per Session: 0 min  Stress: No Stress Concern Present (07/27/2022)   [employer] of Occupational Health - Occupational Stress Questionnaire    Feeling of Stress : Not at all  Social Connections: Moderately Integrated (07/27/2022)   Social Connection and Isolation Panel [NHANES]    Frequency of Communication with Friends and Family: More than three times a week    Frequency of Social Gatherings with Friends and Family: More than three times a week    Attends Religious Services: 1 to 4 times per year    Active Member of Clubs or Organizations: No    Attends Banker Meetings: Never    Marital Status: Married   Past Medical History:  Diagnosis Date   Allergic rhinitis, seasonal    COPD (chronic obstructive pulmonary disease) (HCC)    GAD (generalized anxiety disorder)    GERD (gastroesophageal reflux disease)    History of adenomatous polyp of colon    Hyperlipidemia, mixed    Hypertension    MDD (major depressive disorder)    Mixed stress and urge urinary incontinence    OA (osteoarthritis)    OAB (overactive bladder)    Wears glasses    Past Surgical History:  Procedure Laterality Date   ANTERIOR CERVICAL DECOMP/DISCECTOMY FUSION  02/14/2007   @[facility]  by dr pool   BILATERAL CARPAL TUNNLE RELEASE Bilateral    1990s   BLADDER SUSPENSION N/A 02/14/2023   Procedure: TRANSVAGINAL TAPE (TVT) PROCEDURE;  Surgeon: Marguerita Beards, MD;  Location: [facility];  Service: Gynecology;  Laterality: N/A;  Total time requested is 1 hour   CERVICAL SPINE SURGERY  04/28/2010   @[facility]  by dr pool;   RE-EXPLORATION C5-6 FUSION / REMOVAL HARDWARE AND ACDF C6-7   CHOLECYSTECTOMY OPEN  1981   CYSTOSCOPY N/A 02/14/2023   Procedure: CYSTOSCOPY;  Surgeon: Marguerita Beards, MD;  Location[facility];  Service: Gynecology;  Laterality: N/A;   DEBRIDEMENT TENNIS ELBOW Right     1990s   KNEE CLOSED REDUCTION Right 08/02/2018   Procedure: CLOSED MANIPULATION RIGHT TOTAL KNEE;  Surgeon: Ranee Gosselin, MD;  Location: [facility];  Service: Orthopedics;  Laterality: Right;    LAPAROSCOPIC ASSISTED VAGINAL HYSTERECTOMY  2003   TONSILLECTOMY     child   TOTAL KNEE ARTHROPLASTY Right 05/17/2018   Procedure: RIGHT TOTAL KNEE  ARTHROPLASTY, INSERTION OF BONE GRAFT IN FEMORAL CANAL;  Surgeon: Ranee Gosselin, MD;  Location: [facility];  Service: Orthopedics;  Laterality: Right;   TUBAL LIGATION Bilateral    yrs ago    Family History  Problem Relation Age of Onset   Cancer Mother    Hypertension Mother    Hyperlipidemia Mother    Rectal cancer Mother    Hypertension Father    Hyperlipidemia Father    Heart Problems Father    Arthritis Father    COPD Father    Hypertension Brother    Hypertension Paternal Aunt    Heart Problems Paternal Aunt    Hypertension Paternal Aunt    Hyperlipidemia Paternal Aunt    Hypertension Paternal Aunt    Hyperlipidemia Paternal Aunt    Skin cancer Paternal Aunt    Heart Problems Paternal Aunt    Breast cancer Maternal Grandmother    Hypertension Paternal Grandmother    Heart Problems Paternal Grandmother    Hypertension Paternal Grandfather    Stroke Paternal Grandfather    Hyperlipidemia Paternal Grandfather    Colon cancer Neg Hx    Colon polyps Neg Hx    Esophageal cancer Neg Hx    Stomach cancer Neg Hx     Review of Systems  Constitutional:  Negative for chills, fatigue and fever.  HENT:  Positive for congestion, postnasal drip and sinus pain. Negative for ear pain and sore throat.   Respiratory:  Positive for cough. Negative for shortness of breath.   Cardiovascular:  Negative for chest pain and palpitations.  Gastrointestinal:  Negative for abdominal pain, constipation, diarrhea, nausea and vomiting.  Endocrine: Negative for polydipsia, polyphagia and polyuria.  Genitourinary:  Negative for difficulty urinating and  dysuria.  Musculoskeletal:  Negative for arthralgias, back pain and myalgias.  Skin:  Negative for rash.  Neurological:  Positive for light-headedness. Negative for headaches.  Psychiatric/Behavioral:  Negative for dysphoric mood. The patient is not nervous/anxious.      Objective:  BP 124/78   Pulse 89   Temp 97.9 F (36.6 C)   Resp 14   Ht 5\' 5"  (1.651 m)   Wt 182 lb (82.6 kg)   SpO2 95%   BMI 30.29 kg/m      05/25/2023    3:07 PM 03/30/2023   11:00 AM 03/04/2023   11:48 AM  BP/Weight  Systolic BP 124 141 147  Diastolic BP 78 86 90  Wt. (Lbs) 182    BMI 30.29 kg/m2      Physical Exam Constitutional:      Appearance: Normal appearance.  HENT:     Right Ear: Hearing normal. No drainage or swelling. No middle ear effusion. Tympanic membrane is bulging.     Left Ear: Hearing normal. No drainage or swelling.  No middle ear effusion. Tympanic membrane is not bulging.     Nose: Congestion and rhinorrhea present.     Right Nostril: Epistaxis present.     Left Nostril: No epistaxis.     Right Turbinates: Swollen.     Left Turbinates: Swollen.     Right Sinus: Maxillary sinus tenderness present.     Left Sinus: Maxillary sinus tenderness present.     Mouth/Throat:     Pharynx: No oropharyngeal exudate or posterior oropharyngeal erythema.  Eyes:     Conjunctiva/sclera: Conjunctivae normal.  Neck:     Vascular: No carotid bruit.  Cardiovascular:     Rate and Rhythm: Normal rate and regular rhythm.  Heart sounds: Normal heart sounds.  Pulmonary:     Effort: Pulmonary effort is normal.     Breath sounds: Normal breath sounds.  Abdominal:     General: Bowel sounds are normal.     Palpations: Abdomen is soft.     Tenderness: There is no abdominal tenderness.  Skin:    Findings: No lesion or rash.  Neurological:     Mental Status: She is alert and oriented to person, place, and time.  Psychiatric:        Behavior: Behavior normal.     Lab Results  Component  Value Date   WBC 8.9 11/24/2022   HGB 14.3 02/14/2023   HCT 42.0 02/14/2023   PLT 217 11/24/2022   GLUCOSE 95 02/14/2023   CHOL 185 11/24/2022   TRIG 113 11/24/2022   HDL 54 11/24/2022   LDLCALC 111 (H) 11/24/2022   ALT 11 11/24/2022   AST 14 11/24/2022   NA 140 02/14/2023   K 3.0 (L) 02/14/2023   CL 100 02/14/2023   CREATININE 0.50 02/14/2023   BUN 12 02/14/2023   CO2 24 11/24/2022   TSH 1.630 04/19/2022   INR 0.87 05/10/2018      Assessment & Plan:  Encounter for smoking cessation counseling -     Varenicline Tartrate (Starter); Take 0.5 mg by mouth daily for 3 days, THEN 0.5 mg 2 (two) times daily for 4 days, THEN 1 mg 2 (two) times daily for 21 days.  Dispense: 1 each; Refill: 3 -     buPROPion HCl ER (SR); Take 1 tablet (100 mg total) by mouth 2 (two) times daily.  Dispense: 90 tablet; Refill: 2  Encounter for physical examination -     CBC with Differential/Platelet -     Comprehensive metabolic panel -     Lipid panel -     T4, free -     TSH  Primary hypertension -     Losartan Potassium; TAKE 1 TABLET TWICE A DAY  Dispense: 90 tablet; Refill: 2  Postmenopausal estrogen deficiency -     Estradiol; Take 1 tablet (0.5 mg total) by mouth daily.  Dispense: 90 tablet; Refill: 2  Chronic sinusitis of both maxillary sinuses -     Amoxicillin-Pot Clavulanate; Take 1 tablet by mouth 2 (two) times daily.  Dispense: 20 tablet; Refill: 0     Body mass index is 30.29 kg/m.   These are the goals we discussed:  Goals   None      This is a list of the screening recommended for you and due dates:  Health Maintenance  Topic Date Due   Flu Shot  06/02/2023   Screening for Lung Cancer  11/30/2023   Mammogram  07/21/2024   Colon Cancer Screening  01/24/2026   DTaP/Tdap/Td vaccine (3 - Td or Tdap) 07/30/2029   Zoster (Shingles) Vaccine  Completed   HPV Vaccine  Aged Out   COVID-19 Vaccine  Discontinued     Meds ordered this encounter  Medications   Varenicline  Tartrate, Starter, (CHANTIX STARTING MONTH PAK) 0.5 MG X 11 & 1 MG X 42 TBPK    Sig: Take 0.5 mg by mouth daily for 3 days, THEN 0.5 mg 2 (two) times daily for 4 days, THEN 1 mg 2 (two) times daily for 21 days.    Dispense:  1 each    Refill:  3   buPROPion ER (WELLBUTRIN SR) 100 MG 12 hr tablet    Sig: Take 1  tablet (100 mg total) by mouth 2 (two) times daily.    Dispense:  90 tablet    Refill:  2   losartan (COZAAR) 50 MG tablet    Sig: TAKE 1 TABLET TWICE A DAY    Dispense:  90 tablet    Refill:  2   estradiol (ESTRACE) 0.5 MG tablet    Sig: Take 1 tablet (0.5 mg total) by mouth daily.    Dispense:  90 tablet    Refill:  2   amoxicillin-clavulanate (AUGMENTIN) 875-125 MG tablet    Sig: Take 1 tablet by mouth 2 (two) times daily.    Dispense:  20 tablet    Refill:  0    Follow-up: Return in about 4 weeks (around 06/22/2023) for Chronic, Huston Foley.  An After Visit Summary was printed and given to the patient.  Langley Gauss, [facility] [phone]

## 2023-05-26 LAB — CBC WITH DIFFERENTIAL/PLATELET
Basos: 1 %
Hematocrit: 39.3 % (ref 34.0–46.6)
Hemoglobin: 13.2 g/dL (ref 11.1–15.9)
Immature Grans (Abs): 0 10*3/uL (ref 0.0–0.1)
Immature Granulocytes: 0 %
Lymphocytes Absolute: 2.4 10*3/uL (ref 0.7–3.1)
Lymphs: 32 %
MCHC: 33.6 g/dL (ref 31.5–35.7)
Monocytes Absolute: 0.5 10*3/uL (ref 0.1–0.9)
Monocytes: 7 %
Neutrophils: 58 %
RBC: 4.45 x10E6/uL (ref 3.77–5.28)
WBC: 7.5 10*3/uL (ref 3.4–10.8)

## 2023-05-26 LAB — COMPREHENSIVE METABOLIC PANEL
ALT: 12 IU/L (ref 0–32)
AST: 18 IU/L (ref 0–40)
Albumin: 4.5 g/dL (ref 3.8–4.9)
Alkaline Phosphatase: 113 IU/L (ref 44–121)
BUN/Creatinine Ratio: 20 (ref 9–23)
BUN: 13 mg/dL (ref 6–24)
Bilirubin Total: <0.2 mg/dL (ref 0.0–1.2)
CO2: 28 mmol/L (ref 20–29)
Calcium: 9.8 mg/dL (ref 8.7–10.2)
Chloride: 96 mmol/L (ref 96–106)
Creatinine, Ser: 0.65 mg/dL (ref 0.57–1.00)
Globulin, Total: 2.5 g/dL (ref 1.5–4.5)
Glucose: 94 mg/dL (ref 70–99)
Potassium: 3.2 mmol/L — ABNORMAL LOW (ref 3.5–5.2)
Sodium: 138 mmol/L (ref 134–144)
Total Protein: 7 g/dL (ref 6.0–8.5)
eGFR: 102 mL/min/{1.73_m2} (ref >59)

## 2023-05-26 LAB — LIPID PANEL
Chol/HDL Ratio: 3.3 ratio (ref 0.0–4.4)
Cholesterol, Total: 169 mg/dL (ref 100–199)
HDL: 51 mg/dL (ref >39)
LDL Chol Calc (NIH): 90 mg/dL (ref 0–99)
VLDL Cholesterol Cal: 28 mg/dL (ref 5–40)

## 2023-05-26 LAB — T4, FREE: Free T4: 0.96 ng/dL (ref 0.82–1.77)

## 2023-05-26 LAB — TSH: TSH: 1.61 u[IU]/mL (ref 0.450–4.500)

## 2023-06-08 ENCOUNTER — Encounter: Payer: Self-pay | Admitting: Physician Assistant

## 2023-06-21 ENCOUNTER — Other Ambulatory Visit: Payer: Self-pay

## 2023-06-21 DIAGNOSIS — F411 Generalized anxiety disorder: Secondary | ICD-10-CM

## 2023-06-21 MED ORDER — ESCITALOPRAM OXALATE 10 MG PO TABS
10.0000 mg | ORAL_TABLET | Freq: Every day | ORAL | 3 refills | Status: DC
Start: 2023-06-21 — End: 2023-09-15

## 2023-06-27 ENCOUNTER — Ambulatory Visit (INDEPENDENT_AMBULATORY_CARE_PROVIDER_SITE_OTHER): Payer: 59 | Admitting: Physician Assistant

## 2023-06-27 VITALS — BP 110/68 | HR 71 | Temp 97.3°F | Ht 65.0 in | Wt 183.2 lb

## 2023-06-27 DIAGNOSIS — F17219 Nicotine dependence, cigarettes, with unspecified nicotine-induced disorders: Secondary | ICD-10-CM

## 2023-06-27 DIAGNOSIS — B9689 Other specified bacterial agents as the cause of diseases classified elsewhere: Secondary | ICD-10-CM | POA: Insufficient documentation

## 2023-06-27 DIAGNOSIS — J019 Acute sinusitis, unspecified: Secondary | ICD-10-CM

## 2023-06-27 MED ORDER — CLINDAMYCIN HCL 300 MG PO CAPS
300.0000 mg | ORAL_CAPSULE | Freq: Three times a day (TID) | ORAL | 0 refills | Status: DC
Start: 2023-06-27 — End: 2023-09-01

## 2023-06-27 NOTE — Assessment & Plan Note (Signed)
Admits to tingling in her fingers Minimal symptoms that is managable and is not affecting her daily.  Will continue to monitor symptoms and let us know if they change Continue taking Chantix as prescribed

## 2023-06-28 ENCOUNTER — Encounter: Payer: Self-pay | Admitting: Physician Assistant

## 2023-06-28 NOTE — Assessment & Plan Note (Signed)
Prescribed clindamycin 300mg  Will let us know if it still isnt getting better

## 2023-06-28 NOTE — Progress Notes (Addendum)
Subjective:  Patient ID: Colleen Larsen, female    DOB: 1966/10/12  Age: 57 y.o. MRN: [id number]  Chief Complaint  Patient presents with   Medical Management of Chronic Issues    HPI  Patient is here for a chronic follow up on smoking cessation. Patient states that she is currently down to about 2 cigarettes a day. Patient states she feels the medicine is working well for her and has noticed that her cravings for smoking have decreased. She does admit to some minor irritability, along with some tingling in her fingers. States the tingling started about a week and a half ago. Denies it bothering her and affecting her daily. Does want to continue taking the chantix even though it is more than likely causing the tingling in her hands. Admits to a history of 2 cervical spine surgeries along with bilateral carpel tunnel release.       06/27/2023    3:40 PM 11/24/2022   10:01 AM 08/19/2022    8:29 AM 07/27/2022    8:24 AM 07/27/2022    8:07 AM  Depression screen PHQ 2/9  Decreased Interest 1 0 1 1 0  Down, Depressed, Hopeless 0 0 1 1 0  PHQ - 2 Score 1 0 2 2 0  Altered sleeping 2  1 1  0  Tired, decreased energy 2  1 1  0  Change in appetite 0  0 1 0  Feeling bad or failure about yourself  0  0 1 0  Trouble concentrating 0  0 1 0  Moving slowly or fidgety/restless 0  0 0 0  Suicidal thoughts 0  0 0 0  PHQ-9 Score 5  4 7  0  Difficult doing work/chores Somewhat difficult  Somewhat difficult Not difficult at all Not difficult at all         08/02/2018    9:02 AM 04/14/2022    2:52 PM 07/27/2022    8:07 AM 11/24/2022   10:01 AM 06/27/2023    3:40 PM  Fall Risk  Falls in the past year?  0 0 0 0  Was there an injury with Fall?  0 0 0 0  Fall Risk Category Calculator  0 0 0 0  Fall Risk Category (Retired)  Low Low    (RETIRED) Patient Fall Risk Level Low fall risk Low fall risk Low fall risk    Patient at Risk for Falls Due to  Impaired vision No Fall Risks No Fall Risks No Fall Risks   Fall risk Follow up  Falls evaluation completed;Falls prevention discussed Falls evaluation completed Falls evaluation completed Falls evaluation completed      Review of Systems  Constitutional:  Negative for chills, fatigue and fever.  HENT:  Positive for congestion, rhinorrhea, sinus pressure and sinus pain. Negative for ear pain and sore throat.   Respiratory:  Negative for cough and shortness of breath.   Cardiovascular:  Negative for chest pain and palpitations.  Gastrointestinal:  Negative for abdominal pain, constipation, diarrhea, nausea and vomiting.  Genitourinary:  Negative for difficulty urinating and dysuria.  Musculoskeletal:  Negative for arthralgias, back pain and myalgias.  Skin:  Negative for rash.  Neurological:  Positive for numbness. Negative for dizziness and headaches.  Psychiatric/Behavioral:  Negative for dysphoric mood.     Current Outpatient Medications on File Prior to Visit  Medication Sig Dispense Refill   acetaminophen (TYLENOL) 500 MG tablet Take 1 tablet (500 mg total) by mouth every 6 (six) hours as needed (  pain). 30 tablet 0   albuterol (VENTOLIN HFA) 108 (90 Base) MCG/ACT inhaler Inhale 2 puffs into the lungs every 6 (six) hours as needed for wheezing or shortness of breath. 8 g 2   azelastine (ASTELIN) 0.1 % nasal spray Place 1 spray into both nostrils 2 (two) times daily. Use in each nostril as directed 30 mL 12   buPROPion ER (WELLBUTRIN SR) 100 MG 12 hr tablet Take 1 tablet (100 mg total) by mouth 2 (two) times daily. 90 tablet 2   cetirizine (ZYRTEC) 10 MG tablet Take 10 mg by mouth daily as needed for allergies.      escitalopram (LEXAPRO) 10 MG tablet Take 1 tablet (10 mg total) by mouth at bedtime. 90 tablet 3   estradiol (ESTRACE) 0.1 MG/GM vaginal cream PLACE 0.5 G VAGINALLY 2 (TWO) TIMES A WEEK. PLACE 0.5G NIGHTLY FOR TWO WEEKS THEN TWICE A WEEK AFTER 42.5 g 11   estradiol (ESTRACE) 0.5 MG tablet Take 1 tablet (0.5 mg total) by mouth daily.  90 tablet 2   fluticasone (FLONASE) 50 MCG/ACT nasal spray Place 2 sprays into both nostrils 2 (two) times daily as needed for allergies. 16 g 6   hydrochlorothiazide (HYDRODIURIL) 25 MG tablet Take 1 tablet (25 mg total) by mouth daily. (Patient taking differently: Take 25 mg by mouth daily.) 90 tablet 1   ibuprofen (ADVIL) 600 MG tablet Take 1 tablet (600 mg total) by mouth every 6 (six) hours as needed. 30 tablet 0   ibuprofen (ADVIL,MOTRIN) 200 MG tablet Take 600 mg by mouth every 4 (four) hours as needed for headache or moderate pain.     losartan (COZAAR) 50 MG tablet TAKE 1 TABLET TWICE A DAY 90 tablet 2   Melatonin 10 MG TABS Take 10 mg by mouth at bedtime as needed (for sleep).     montelukast (SINGULAIR) 10 MG tablet Take 1 tablet (10 mg total) by mouth at bedtime. 90 tablet 3   ondansetron (ZOFRAN) 4 MG tablet Take 1 tablet (4 mg total) by mouth every 8 (eight) hours as needed for nausea or vomiting. 20 tablet 0   pantoprazole (PROTONIX) 40 MG tablet Take 1 tablet (40 mg total) by mouth daily. (Patient taking differently: Take 40 mg by mouth daily.) 90 tablet 1   polyethylene glycol powder (GLYCOLAX/MIRALAX) 17 GM/SCOOP powder Take 17 g by mouth daily. Drink 17g (1 scoop) dissolved in water per day. 255 g 0   rosuvastatin (CRESTOR) 5 MG tablet Take 1 tablet (5 mg total) by mouth daily. (Patient taking differently: Take 5 mg by mouth at bedtime.) 90 tablet 1   Trospium Chloride 60 MG CP24 Take 1 capsule (60 mg total) by mouth daily. 90 capsule 3   Vitamin D, Ergocalciferol, (DRISDOL) 1.25 MG (50000 UNIT) CAPS capsule Take 1 capsule (50,000 Units total) by mouth every 7 (seven) days. (Patient taking differently: Take 50,000 Units by mouth every 7 (seven) days. Saturday's) 12 capsule 1   No current facility-administered medications on file prior to visit.   Past Medical History:  Diagnosis Date   Allergic rhinitis, seasonal    COPD (chronic obstructive pulmonary disease) (HCC)    GAD  (generalized anxiety disorder)    GERD (gastroesophageal reflux disease)    History of adenomatous polyp of colon    Hyperlipidemia, mixed    Hypertension    MDD (major depressive disorder)    Mixed stress and urge urinary incontinence    OA (osteoarthritis)    OAB (overactive bladder)  Wears glasses    Past Surgical History:  Procedure Laterality Date   ANTERIOR CERVICAL DECOMP/DISCECTOMY FUSION  02/14/2007   @[facility]  by dr pool   BILATERAL CARPAL TUNNLE RELEASE Bilateral    1990s   BLADDER SUSPENSION N/A 02/14/2023   Procedure: TRANSVAGINAL TAPE (TVT) PROCEDURE;  Surgeon: Marguerita Beards, MD;  Location: [facility];  Service: Gynecology;  Laterality: N/A;  Total time requested is 1 hour   CERVICAL SPINE SURGERY  04/28/2010   @[facility]  by dr pool;   RE-EXPLORATION C5-6 FUSION / REMOVAL HARDWARE AND ACDF C6-7   CHOLECYSTECTOMY OPEN  1981   CYSTOSCOPY N/A 02/14/2023   Procedure: CYSTOSCOPY;  Surgeon: Marguerita Beards, MD;  Location: [facility];  Service: Gynecology;  Laterality: N/A;   DEBRIDEMENT TENNIS ELBOW Right    1990s   KNEE CLOSED REDUCTION Right 08/02/2018   Procedure: CLOSED MANIPULATION RIGHT TOTAL KNEE;  Surgeon: Ranee Gosselin, MD;  Location: [facility];  Service: Orthopedics;  Laterality: Right;    LAPAROSCOPIC ASSISTED VAGINAL HYSTERECTOMY  2003   TONSILLECTOMY     child   TOTAL KNEE ARTHROPLASTY Right 05/17/2018   Procedure: RIGHT TOTAL KNEE ARTHROPLASTY, INSERTION OF BONE GRAFT IN FEMORAL CANAL;  Surgeon: Ranee Gosselin, MD;  Location: [facility];  Service: Orthopedics;  Laterality: Right;   TUBAL LIGATION Bilateral    yrs ago    Family History  Problem Relation Age of Onset   Cancer Mother    Hypertension Mother    Hyperlipidemia Mother    Rectal cancer Mother    Hypertension Father    Hyperlipidemia Father    Heart Problems Father    Arthritis Father    COPD Father    Hypertension Brother    Hypertension Paternal Aunt     Heart Problems Paternal Aunt    Hypertension Paternal Aunt    Hyperlipidemia Paternal Aunt    Hypertension Paternal Aunt    Hyperlipidemia Paternal Aunt    Skin cancer Paternal Aunt    Heart Problems Paternal Aunt    Breast cancer Maternal Grandmother    Hypertension Paternal Grandmother    Heart Problems Paternal Grandmother    Hypertension Paternal Grandfather    Stroke Paternal Grandfather    Hyperlipidemia Paternal Grandfather    Colon cancer Neg Hx    Colon polyps Neg Hx    Esophageal cancer Neg Hx    Stomach cancer Neg Hx    Social History   Socioeconomic History   Marital status: Married    Spouse name: Not on file   Number of children: Not on file   Years of education: Not on file   Highest education level: Not on file  Occupational History   Not on file  Tobacco Use   Smoking status: Every Day    Current packs/day: 1.00    Average packs/day: 1 pack/day for 30.0 years (30.0 ttl pk-yrs)    Types: Cigarettes   Smokeless tobacco: Never  Vaping Use   Vaping status: Some Days   Substances: Nicotine, Flavoring   Devices: buse  Substance and Sexual Activity   Alcohol use: Yes    Comment: RARE   Drug use: Never   Sexual activity: Yes    Partners: Male    Birth control/protection: Surgical  Other Topics Concern   Not on file  Social History Narrative   Not on file   Social Determinants of Health   Financial Resource Strain: Low Risk  (07/27/2022)   Overall Financial Resource  Strain (CARDIA)    Difficulty of Paying Living Expenses: Not hard at all  Food Insecurity: No Food Insecurity (07/27/2022)   Hunger Vital Sign    Worried About Running Out of Food in the Last Year: Never true    Ran Out of Food in the Last Year: Never true  Transportation Needs: No Transportation Needs (07/27/2022)   PRAPARE - Administrator, Civil Service (Medical): No    Lack of Transportation (Non-Medical): No  Physical Activity: Inactive (07/27/2022)   Exercise Vital  Sign    Days of Exercise per Week: 0 days    Minutes of Exercise per Session: 0 min  Stress: No Stress Concern Present (07/27/2022)   [employer] of Occupational Health - Occupational Stress Questionnaire    Feeling of Stress : Not at all  Social Connections: Moderately Integrated (07/27/2022)   Social Connection and Isolation Panel [NHANES]    Frequency of Communication with Friends and Family: More than three times a week    Frequency of Social Gatherings with Friends and Family: More than three times a week    Attends Religious Services: 1 to 4 times per year    Active Member of [employer] or Organizations: No    Attends Engineer, structural: Never    Marital Status: Married    Objective:  BP 110/68 (BP Location: Left Arm, Patient Position: Sitting, Cuff Size: Large)   Pulse 71   Temp (!) 97.3 F (36.3 C) (Temporal)   Ht 5\' 5"  (1.651 m)   Wt 183 lb 3.2 oz (83.1 kg)   SpO2 96%   BMI 30.49 kg/m      06/27/2023    3:37 PM 05/25/2023    3:07 PM 03/30/2023   11:00 AM  BP/Weight  Systolic BP 110 124 141  Diastolic BP 68 78 86  Wt. (Lbs) 183.2 182   BMI 30.49 kg/m2 30.29 kg/m2     Physical Exam Vitals reviewed.  Constitutional:      Appearance: Normal appearance.  HENT:     Head: Normocephalic.     Right Ear: Hearing and tympanic membrane normal.     Left Ear: Hearing and tympanic membrane normal.     Nose: Congestion and rhinorrhea present. Rhinorrhea is purulent.     Right Turbinates: Enlarged and swollen.     Left Turbinates: Enlarged and swollen.  Cardiovascular:     Rate and Rhythm: Normal rate and regular rhythm.     Heart sounds: Normal heart sounds.  Pulmonary:     Effort: Pulmonary effort is normal.     Breath sounds: Normal breath sounds.  Abdominal:     General: Bowel sounds are normal.     Palpations: Abdomen is soft.     Tenderness: There is no abdominal tenderness.  Neurological:     Mental Status: She is alert and oriented to person, place,  and time.     Comments: [place] -   Psychiatric:        Mood and Affect: Mood normal.        Behavior: Behavior normal.     Diabetic Foot Exam - Simple   No data filed      Lab Results  Component Value Date   WBC 7.5 05/25/2023   HGB 13.2 05/25/2023   HCT 39.3 05/25/2023   PLT 197 05/25/2023   GLUCOSE 94 05/25/2023   CHOL 169 05/25/2023   TRIG 162 (H) 05/25/2023   HDL 51 05/25/2023   LDLCALC 90 05/25/2023  ALT 12 05/25/2023   AST 18 05/25/2023   NA 138 05/25/2023   K 3.2 (L) 05/25/2023   CL 96 05/25/2023   CREATININE 0.65 05/25/2023   BUN 13 05/25/2023   CO2 28 05/25/2023   TSH 1.610 05/25/2023   INR 0.87 05/10/2018      Assessment & Plan:    Acute bacterial sinusitis Assessment & Plan: Prescribed clindamycin 300mg  Will let us know if it still isnt getting better  Orders: -     Clindamycin HCl; Take 1 capsule (300 mg total) by mouth 3 (three) times daily.  Dispense: 30 capsule; Refill: 0  Cigarette nicotine dependence with nicotine-induced disorder Assessment & Plan: Admits to tingling in her fingers Minimal symptoms that is managable and is not affecting her daily.  Will continue to monitor symptoms and let us know if they change Continue taking Chantix as prescribed       Meds ordered this encounter  Medications   clindamycin (CLEOCIN) 300 MG capsule    Sig: Take 1 capsule (300 mg total) by mouth 3 (three) times daily.    Dispense:  30 capsule    Refill:  0    No orders of the defined types were placed in this encounter.    Follow-up: Return in about 2 months (around 09/07/2023) for Chronic, fasting.   I,Muscab Brenneman,acting as a Neurosurgeon for [employer], PA.,have documented all relevant documentation on the behalf of Langley Gauss, PA,as directed by  Langley Gauss, PA while in the presence of Langley Gauss, [place].   An After Visit Summary was printed and given to the patient.  Langley Gauss, [place] [facility] [phone]

## 2023-07-17 ENCOUNTER — Other Ambulatory Visit: Payer: Self-pay | Admitting: Family Medicine

## 2023-07-17 DIAGNOSIS — K219 Gastro-esophageal reflux disease without esophagitis: Secondary | ICD-10-CM

## 2023-07-17 DIAGNOSIS — E7849 Other hyperlipidemia: Secondary | ICD-10-CM

## 2023-07-17 DIAGNOSIS — I10 Essential (primary) hypertension: Secondary | ICD-10-CM

## 2023-08-29 ENCOUNTER — Ambulatory Visit: Payer: 59 | Admitting: Physician Assistant

## 2023-09-01 ENCOUNTER — Ambulatory Visit: Payer: 59 | Admitting: Physician Assistant

## 2023-09-01 VITALS — BP 132/82 | HR 78 | Temp 97.1°F | Ht 65.0 in | Wt 186.0 lb

## 2023-09-01 DIAGNOSIS — I10 Essential (primary) hypertension: Secondary | ICD-10-CM

## 2023-09-01 DIAGNOSIS — Z6829 Body mass index (BMI) 29.0-29.9, adult: Secondary | ICD-10-CM

## 2023-09-01 DIAGNOSIS — K219 Gastro-esophageal reflux disease without esophagitis: Secondary | ICD-10-CM

## 2023-09-01 DIAGNOSIS — M25562 Pain in left knee: Secondary | ICD-10-CM

## 2023-09-01 DIAGNOSIS — Z780 Asymptomatic menopausal state: Secondary | ICD-10-CM

## 2023-09-01 DIAGNOSIS — G8929 Other chronic pain: Secondary | ICD-10-CM

## 2023-09-01 DIAGNOSIS — E782 Mixed hyperlipidemia: Secondary | ICD-10-CM | POA: Diagnosis not present

## 2023-09-01 DIAGNOSIS — F411 Generalized anxiety disorder: Secondary | ICD-10-CM

## 2023-09-01 DIAGNOSIS — E559 Vitamin D deficiency, unspecified: Secondary | ICD-10-CM | POA: Diagnosis not present

## 2023-09-01 DIAGNOSIS — R200 Anesthesia of skin: Secondary | ICD-10-CM

## 2023-09-01 DIAGNOSIS — R202 Paresthesia of skin: Secondary | ICD-10-CM

## 2023-09-01 DIAGNOSIS — M5432 Sciatica, left side: Secondary | ICD-10-CM

## 2023-09-01 DIAGNOSIS — M5431 Sciatica, right side: Secondary | ICD-10-CM

## 2023-09-01 DIAGNOSIS — M25561 Pain in right knee: Secondary | ICD-10-CM

## 2023-09-01 DIAGNOSIS — E66811 Obesity, class 1: Secondary | ICD-10-CM

## 2023-09-01 MED ORDER — PHENTERMINE HCL 37.5 MG PO CAPS: 37.5000 mg | ORAL_CAPSULE | ORAL | 0 refills | Status: DC

## 2023-09-01 MED ORDER — MELOXICAM 15 MG PO TABS: 15.0000 mg | ORAL_TABLET | Freq: Every day | ORAL | 0 refills | Status: DC

## 2023-09-01 NOTE — Progress Notes (Addendum)
Subjective:  Patient ID: Colleen Larsen, female    DOB: 12-Jan-1966  Age: 57 y.o. MRN: [id number]     HPI   Hypertension: Patient is currently taking Losartan 50 mg 1 tablet daily. She denies any HA, dizziness, or issues with the medicine.  Hyperlipidemia:  Patient is currently taking Rosuvastatin 5 mg 1 tablet daily. Denies any joint pain or issues with the medicine  Anxiety: Patient is currently taking Lexapro 10mg . Feels her anxiety is well controlled and has not had any issues.   GERD: Controlled, continue to monitor symptoms and take protonix 40mg   Fluid in ear: She has been taking a daily allergy medicine to try and control her allergies. She states she has been experiencing popping in her ears over the past few days, along with the swelling in her hands. States the popping does not happen all the time, but that it will happen every now and then. Denies any sinus pressure, sinus drainage.   Knee pain:  Has been trying to walk more to help loose weight along with monitoring her diet. She has since been having knee pain that is making it hard for her to walk. She has taken advil, ibuprofen, and tylenol and it has not helped. She would like to try something new.  Hand swelling: She noticed about 2 days ago that her hands had began swelling. She denies itching, pain, discoloration. She has a little numbness and tingling that started about the same time.   Weight management: Has been working on her diet and exercise. She has been walking more, but that has lead to her having increased knee pain. She states the advil and tylenol have not been able to help her with her knee pain.   Sciatica: will try stretches and possibly gabapentin if the pain continues. Showed her some stretches here in office that can help. States that after performing them they helped a little.        06/27/2023    3:40 PM 11/24/2022   10:01 AM 08/19/2022    8:29 AM 07/27/2022    8:24 AM 07/27/2022    8:07 AM   Depression screen PHQ 2/9  Decreased Interest 1 0 1 1 0  Down, Depressed, Hopeless 0 0 1 1 0  PHQ - 2 Score 1 0 2 2 0  Altered sleeping 2  1 1  0  Tired, decreased energy 2  1 1  0  Change in appetite 0  0 1 0  Feeling bad or failure about yourself  0  0 1 0  Trouble concentrating 0  0 1 0  Moving slowly or fidgety/restless 0  0 0 0  Suicidal thoughts 0  0 0 0  PHQ-9 Score 5  4 7  0  Difficult doing work/chores Somewhat difficult  Somewhat difficult Not difficult at all Not difficult at all        06/27/2023    3:40 PM  Fall Risk   Falls in the past year? 0  Number falls in past yr: 0  Injury with Fall? 0  Risk for fall due to : No Fall Risks  Follow up Falls evaluation completed    Patient Care Team: Langley Gauss, [place] as PCP - General (Physician Assistant)   Review of Systems  Constitutional:  Negative for appetite change, fatigue and fever.  HENT:  Negative for congestion, ear pain, sinus pressure and sore throat.   Respiratory:  Negative for cough, chest tightness, shortness of breath and wheezing.  Cardiovascular:  Negative for chest pain and palpitations.  Gastrointestinal:  Negative for abdominal pain, constipation, diarrhea, nausea and vomiting.  Genitourinary:  Negative for dysuria and hematuria.  Musculoskeletal:  Positive for joint swelling (hands). Negative for arthralgias, back pain and myalgias.  Skin:  Negative for rash.  Neurological:  Negative for dizziness, weakness and headaches.  Psychiatric/Behavioral:  Negative for dysphoric mood. The patient is not nervous/anxious.     Current Outpatient Medications on File Prior to Visit  Medication Sig Dispense Refill   acetaminophen (TYLENOL) 500 MG tablet Take 1 tablet (500 mg total) by mouth every 6 (six) hours as needed (pain). 30 tablet 0   albuterol (VENTOLIN HFA) 108 (90 Base) MCG/ACT inhaler Inhale 2 puffs into the lungs every 6 (six) hours as needed for wheezing or shortness of breath. 8 g 2   azelastine  (ASTELIN) 0.1 % nasal spray Place 1 spray into both nostrils 2 (two) times daily. Use in each nostril as directed 30 mL 12   cetirizine (ZYRTEC) 10 MG tablet Take 10 mg by mouth daily as needed for allergies.      estradiol (ESTRACE) 0.5 MG tablet Take 1 tablet (0.5 mg total) by mouth daily. 90 tablet 2   fluticasone (FLONASE) 50 MCG/ACT nasal spray Place 2 sprays into both nostrils 2 (two) times daily as needed for allergies. 16 g 6   hydrochlorothiazide (HYDRODIURIL) 25 MG tablet Take 1 tablet (25 mg total) by mouth daily. 90 tablet 3   ibuprofen (ADVIL) 600 MG tablet Take 1 tablet (600 mg total) by mouth every 6 (six) hours as needed. 30 tablet 0   ibuprofen (ADVIL,MOTRIN) 200 MG tablet Take 600 mg by mouth every 4 (four) hours as needed for headache or moderate pain.     losartan (COZAAR) 50 MG tablet TAKE 1 TABLET TWICE A DAY 90 tablet 2   Melatonin 10 MG TABS Take 10 mg by mouth at bedtime as needed (for sleep).     montelukast (SINGULAIR) 10 MG tablet Take 1 tablet (10 mg total) by mouth at bedtime. 90 tablet 3   pantoprazole (PROTONIX) 40 MG tablet TAKE 1 TABLET DAILY 90 tablet 1   polyethylene glycol powder (GLYCOLAX/MIRALAX) 17 GM/SCOOP powder Take 17 g by mouth daily. Drink 17g (1 scoop) dissolved in water per day. 255 g 0   rosuvastatin (CRESTOR) 5 MG tablet Take 1 tablet (5 mg total) by mouth at bedtime. 90 tablet 3   Trospium Chloride 60 MG CP24 Take 1 capsule (60 mg total) by mouth daily. 90 capsule 3   Vitamin D, Ergocalciferol, (DRISDOL) 1.25 MG (50000 UNIT) CAPS capsule Take 1 capsule (50,000 Units total) by mouth every 7 (seven) days. (Patient taking differently: Take 50,000 Units by mouth every 7 (seven) days. Saturday's) 12 capsule 1   ondansetron (ZOFRAN) 4 MG tablet Take 1 tablet (4 mg total) by mouth every 8 (eight) hours as needed for nausea or vomiting. (Patient not taking: Reported on 09/01/2023) 20 tablet 0   No current facility-administered medications on file prior to  visit.   Past Medical History:  Diagnosis Date   Allergic rhinitis, seasonal    COPD (chronic obstructive pulmonary disease) (HCC)    GAD (generalized anxiety disorder)    GERD (gastroesophageal reflux disease)    History of adenomatous polyp of colon    Hyperlipidemia, mixed    Hypertension    MDD (major depressive disorder)    Mixed stress and urge urinary incontinence    OA (osteoarthritis)  OAB (overactive bladder)    Wears glasses    Past Surgical History:  Procedure Laterality Date   ANTERIOR CERVICAL DECOMP/DISCECTOMY FUSION  02/14/2007   @[facility]  by dr pool   BILATERAL CARPAL TUNNLE RELEASE Bilateral    1990s   BLADDER SUSPENSION N/A 02/14/2023   Procedure: TRANSVAGINAL TAPE (TVT) PROCEDURE;  Surgeon: Marguerita Beards, MD;  [facility];  Service: Gynecology;  Laterality: N/A;  Total time requested is 1 hour   CERVICAL SPINE SURGERY  04/28/2010   @[facility]  by dr pool;   RE-EXPLORATION C5-6 FUSION / REMOVAL HARDWARE AND ACDF C6-7   CHOLECYSTECTOMY OPEN  1981   CYSTOSCOPY N/A 02/14/2023   Procedure: CYSTOSCOPY;  Surgeon: Marguerita Beards, MD;  Location: [facility];  Service: Gynecology;  Laterality: N/A;   DEBRIDEMENT TENNIS ELBOW Right    1990s   KNEE CLOSED REDUCTION Right 08/02/2018   Procedure: CLOSED MANIPULATION RIGHT TOTAL KNEE;  Surgeon: Ranee Gosselin, MD;  Location: [facility];  Service: Orthopedics;  Laterality: Right;    LAPAROSCOPIC ASSISTED VAGINAL HYSTERECTOMY  2003   TONSILLECTOMY     child   TOTAL KNEE ARTHROPLASTY Right 05/17/2018   Procedure: RIGHT TOTAL KNEE ARTHROPLASTY, INSERTION OF BONE GRAFT IN FEMORAL CANAL;  Surgeon: Ranee Gosselin, MD;  Location: [facility];  Service: Orthopedics;  Laterality: Right;   TUBAL LIGATION Bilateral    yrs ago    Family History  Problem Relation Age of Onset   Cancer Mother    Hypertension Mother    Hyperlipidemia Mother    Rectal cancer Mother    Hypertension Father     Hyperlipidemia Father    Heart Problems Father    Arthritis Father    COPD Father    Hypertension Brother    Hypertension Paternal Aunt    Heart Problems Paternal Aunt    Hypertension Paternal Aunt    Hyperlipidemia Paternal Aunt    Hypertension Paternal Aunt    Hyperlipidemia Paternal Aunt    Skin cancer Paternal Aunt    Heart Problems Paternal Aunt    Breast cancer Maternal Grandmother    Hypertension Paternal Grandmother    Heart Problems Paternal Grandmother    Hypertension Paternal Grandfather    Stroke Paternal Grandfather    Hyperlipidemia Paternal Grandfather    Colon cancer Neg Hx    Colon polyps Neg Hx    Esophageal cancer Neg Hx    Stomach cancer Neg Hx    Social History   Socioeconomic History   Marital status: Married    Spouse name: Not on file   Number of children: Not on file   Years of education: Not on file   Highest education level: Not on file  Occupational History   Not on file  Tobacco Use   Smoking status: Every Day    Current packs/day: 1.00    Average packs/day: 1 pack/day for 30.0 years (30.0 ttl pk-yrs)    Types: Cigarettes   Smokeless tobacco: Never  Vaping Use   Vaping status: Some Days   Substances: Nicotine, Flavoring   Devices: buse  Substance and Sexual Activity   Alcohol use: Yes    Comment: RARE   Drug use: Never   Sexual activity: Yes    Partners: Male    Birth control/protection: Surgical  Other Topics Concern   Not on file  Social History Narrative   Not on file   Social Determinants of Health   Financial Resource Strain: Low Risk  (  07/27/2022)   Overall Financial Resource Strain (CARDIA)    Difficulty of Paying Living Expenses: Not hard at all  Food Insecurity: No Food Insecurity (07/27/2022)   Hunger Vital Sign    Worried About Running Out of Food in the Last Year: Never true    Ran Out of Food in the Last Year: Never true  Transportation Needs: No Transportation Needs (07/27/2022)   PRAPARE - Therapist, art (Medical): No    Lack of Transportation (Non-Medical): No  Physical Activity: Inactive (07/27/2022)   Exercise Vital Sign    Days of Exercise per Week: 0 days    Minutes of Exercise per Session: 0 min  Stress: No Stress Concern Present (07/27/2022)   [employer] of Occupational Health - Occupational Stress Questionnaire    Feeling of Stress : Not at all  Social Connections: Moderately Integrated (07/27/2022)   Social Connection and Isolation Panel [NHANES]    Frequency of Communication with Friends and Family: More than three times a week    Frequency of Social Gatherings with Friends and Family: More than three times a week    Attends Religious Services: 1 to 4 times per year    Active Member of [employer] or Organizations: No    Attends Engineer, structural: Never    Marital Status: Married    Objective:  BP 132/82 (BP Location: Left Arm, Patient Position: Sitting)   Pulse 78   Temp (!) 97.1 F (36.2 C) (Temporal)   Ht 5\' 5"  (1.651 m)   Wt 186 lb (84.4 kg)   SpO2 98%   BMI 30.95 kg/m      09/02/2023    9:50 AM 06/27/2023    3:37 PM 05/25/2023    3:07 PM  BP/Weight  Systolic BP 132 110 124  Diastolic BP 82 68 78  Wt. (Lbs) 186 183.2 182  BMI 30.95 kg/m2 30.49 kg/m2 30.29 kg/m2    Physical Exam Vitals reviewed.  Constitutional:      Appearance: Normal appearance.  HENT:     Right Ear: Hearing normal. Swelling present. Tympanic membrane is bulging.     Left Ear: Hearing normal. Swelling present. Tympanic membrane is bulging.  Cardiovascular:     Rate and Rhythm: Normal rate and regular rhythm.     Heart sounds: Normal heart sounds.  Pulmonary:     Effort: Pulmonary effort is normal.     Breath sounds: Normal breath sounds.  Abdominal:     General: Bowel sounds are normal.     Palpations: Abdomen is soft.     Tenderness: There is no abdominal tenderness.  Musculoskeletal:     Right hand: Swelling present. No deformity or tenderness.  Normal strength.     Left hand: Swelling present. No deformity or tenderness. Normal strength.  Neurological:     Mental Status: She is alert and oriented to person, place, and time.  Psychiatric:        Mood and Affect: Mood normal.        Behavior: Behavior normal.     Diabetic Foot Exam - Simple   No data filed      Lab Results  Component Value Date   WBC 5.9 09/01/2023   HGB 12.3 09/01/2023   HCT 37.1 09/01/2023   PLT 189 09/01/2023   GLUCOSE 100 (H) 09/01/2023   CHOL 164 09/01/2023   TRIG 122 09/01/2023   HDL 53 09/01/2023   LDLCALC 89 09/01/2023   ALT 18 09/01/2023  AST 17 09/01/2023   NA 141 09/01/2023   K 4.0 09/01/2023   CL 100 09/01/2023   CREATININE 0.67 09/01/2023   BUN 17 09/01/2023   CO2 26 09/01/2023   TSH 2.030 09/01/2023   INR 0.87 05/10/2018   HGBA1C 6.2 (H) 09/01/2023      Assessment & Plan:    Primary hypertension Assessment & Plan: Well controlled.  Continue to work on eating a healthy diet and exercise.  Labs drawn today.   No major side effects reported, and no issues with compliance. The current medical regimen is effective;  continue present plan with Losartan 50mg  Will adjust medication as needed depending on labs BP Readings from Last 3 Encounters:  09/02/23 132/82  06/27/23 110/68  05/25/23 124/78     Orders: -     CBC with Differential/Platelet -     Comprehensive metabolic panel -     Hemoglobin A1c -     T4, free -     TSH -     Phentermine HCl; Take 1 capsule (37.5 mg total) by mouth every morning.  Dispense: 30 capsule; Refill: 0  GAD (generalized anxiety disorder) Assessment & Plan: Controlled Continue taking Lexapro 10mg  Continue to monitor symptoms Will adjust treatment as needed   Mixed hyperlipidemia -     Lipid panel  Vitamin D deficiency -     VITAMIN D 25 Hydroxy (Vit-D Deficiency, Fractures)  Gastroesophageal reflux disease, unspecified whether esophagitis present Assessment &  Plan: Controlled Continue taking protonix 40mg  Will adjust treatment depending on symptoms   Postmenopausal estrogen deficiency  Numbness and tingling in both hands -     B12 and Folate Panel  BMI 29.0-29.9,adult Assessment & Plan: Prescribed Phentermine 37.5 Continue to monitor for abnormal side effects like increased BP, heart rate, or insomnia Will adjust treatment depending on symptoms  Orders: -     Phentermine HCl; Take 1 capsule (37.5 mg total) by mouth every morning.  Dispense: 30 capsule; Refill: 0  Chronic pain of both knees Assessment & Plan: Prescribed Mobic 10mg  Continue to monitor symptoms Will adjust treatment as needed   Bilateral sciatica Assessment & Plan: Performed stretches that she will try at home  Will also use a tennis ball for trigger point massage Will prescribe gabapentin if it continues   Class 1 obesity due to excess calories with serious comorbidity and body mass index (BMI) of 30.0 to 30.9 in adult Assessment & Plan: Prescribed Phentermine 37.5 Continue to monitor for abnormal side effects like increased BP, heart rate, or insomnia Will adjust treatment depending on symptoms      Meds ordered this encounter  Medications   DISCONTD: meloxicam (MOBIC) 15 MG tablet    Sig: Take 1 tablet (15 mg total) by mouth daily.    Dispense:  30 tablet    Refill:  0   phentermine 37.5 MG capsule    Sig: Take 1 capsule (37.5 mg total) by mouth every morning.    Dispense:  30 capsule    Refill:  0    Orders Placed This Encounter  Procedures   CBC with Differential/Platelet   B12 and Folate Panel   Comprehensive metabolic panel   Hemoglobin A1c   Lipid panel   T4, free   TSH   VITAMIN D 25 Hydroxy (Vit-D Deficiency, Fractures)     Follow-up: Return in about 3 months (around 12/02/2023) for Chronic, Huston Foley.   I,Lauren M Auman,acting as a Neurosurgeon for [employer], [employer].,have documented all relevant  documentation on the behalf of Langley Gauss,  PA,as directed by  Langley Gauss, PA while in the presence of Langley Gauss, [place].   An After Visit Summary was printed and given to the patient.  Langley Gauss, [place] [facility] [phone]

## 2023-09-02 ENCOUNTER — Encounter: Payer: Self-pay | Admitting: Physician Assistant

## 2023-09-02 DIAGNOSIS — M5430 Sciatica, unspecified side: Secondary | ICD-10-CM | POA: Insufficient documentation

## 2023-09-02 LAB — CBC WITH DIFFERENTIAL/PLATELET
Basophils Absolute: 0 10*3/uL (ref 0.0–0.2)
Basos: 1 %
EOS (ABSOLUTE): 0.1 10*3/uL (ref 0.0–0.4)
Eos: 2 %
Hematocrit: 37.1 % (ref 34.0–46.6)
Hemoglobin: 12.3 g/dL (ref 11.1–15.9)
Immature Grans (Abs): 0 10*3/uL (ref 0.0–0.1)
Immature Granulocytes: 0 %
Lymphocytes Absolute: 1.7 10*3/uL (ref 0.7–3.1)
Lymphs: 28 %
MCH: 30.1 pg (ref 26.6–33.0)
MCHC: 33.2 g/dL (ref 31.5–35.7)
MCV: 91 fL (ref 79–97)
Monocytes Absolute: 0.5 10*3/uL (ref 0.1–0.9)
Monocytes: 9 %
Neutrophils Absolute: 3.5 10*3/uL (ref 1.4–7.0)
Neutrophils: 60 %
Platelets: 189 10*3/uL (ref 150–450)
RBC: 4.08 x10E6/uL (ref 3.77–5.28)
RDW: 13.4 % (ref 11.7–15.4)
WBC: 5.9 10*3/uL (ref 3.4–10.8)

## 2023-09-02 LAB — HEMOGLOBIN A1C
Est. average glucose Bld gHb Est-mCnc: 131 mg/dL
Hgb A1c MFr Bld: 6.2 % — ABNORMAL HIGH (ref 4.8–5.6)

## 2023-09-02 LAB — LIPID PANEL
Chol/HDL Ratio: 3.1 ratio (ref 0.0–4.4)
Cholesterol, Total: 164 mg/dL (ref 100–199)
HDL: 53 mg/dL (ref >39)
LDL Chol Calc (NIH): 89 mg/dL (ref 0–99)
Triglycerides: 122 mg/dL (ref 0–149)
VLDL Cholesterol Cal: 22 mg/dL (ref 5–40)

## 2023-09-02 LAB — COMPREHENSIVE METABOLIC PANEL
ALT: 18 [IU]/L (ref 0–32)
AST: 17 [IU]/L (ref 0–40)
Albumin: 4 g/dL (ref 3.8–4.9)
Alkaline Phosphatase: 101 [IU]/L (ref 44–121)
BUN/Creatinine Ratio: 25 — ABNORMAL HIGH (ref 9–23)
BUN: 17 mg/dL (ref 6–24)
Bilirubin Total: 0.3 mg/dL (ref 0.0–1.2)
CO2: 26 mmol/L (ref 20–29)
Calcium: 9.4 mg/dL (ref 8.7–10.2)
Chloride: 100 mmol/L (ref 96–106)
Creatinine, Ser: 0.67 mg/dL (ref 0.57–1.00)
Globulin, Total: 2.5 g/dL (ref 1.5–4.5)
Glucose: 100 mg/dL — ABNORMAL HIGH (ref 70–99)
Potassium: 4 mmol/L (ref 3.5–5.2)
Sodium: 141 mmol/L (ref 134–144)
Total Protein: 6.5 g/dL (ref 6.0–8.5)
eGFR: 101 mL/min/{1.73_m2} (ref >59)

## 2023-09-02 LAB — B12 AND FOLATE PANEL
Folate: 2.2 ng/mL — ABNORMAL LOW (ref >3.0)
Vitamin B-12: 226 pg/mL — ABNORMAL LOW (ref 232–1245)

## 2023-09-02 LAB — TSH: TSH: 2.03 u[IU]/mL (ref 0.450–4.500)

## 2023-09-02 LAB — VITAMIN D 25 HYDROXY (VIT D DEFICIENCY, FRACTURES): Vit D, 25-Hydroxy: 39.7 ng/mL (ref 30.0–100.0)

## 2023-09-02 LAB — T4, FREE: Free T4: 0.77 ng/dL — ABNORMAL LOW (ref 0.82–1.77)

## 2023-09-02 NOTE — Assessment & Plan Note (Signed)
Performed stretches that she will try at home  Will also use a tennis ball for trigger point massage Will prescribe gabapentin if it continues

## 2023-09-05 ENCOUNTER — Ambulatory Visit (INDEPENDENT_AMBULATORY_CARE_PROVIDER_SITE_OTHER): Payer: 59

## 2023-09-05 ENCOUNTER — Other Ambulatory Visit: Payer: Self-pay | Admitting: Physician Assistant

## 2023-09-05 DIAGNOSIS — E538 Deficiency of other specified B group vitamins: Secondary | ICD-10-CM | POA: Diagnosis not present

## 2023-09-05 MED ORDER — CYANOCOBALAMIN 1000 MCG/ML IJ SOLN
1000.0000 ug | Freq: Once | INTRAMUSCULAR | Status: AC
  Administered 2023-09-05: 1000 ug via INTRAMUSCULAR

## 2023-09-05 MED ORDER — CYANOCOBALAMIN 1000 MCG/ML IJ SOLN: 1000.0000 ug | INTRAMUSCULAR | 3 refills | Status: DC

## 2023-09-05 MED ORDER — FOLIC ACID 1 MG PO TABS: 1.0000 mg | ORAL_TABLET | Freq: Every day | ORAL | 3 refills | Status: DC

## 2023-09-05 NOTE — Progress Notes (Signed)
      Patient: Colleen Larsen  DOB: 24-Sep-1966  MRN: [id number]    Visit Date: 09/05/2023    Colleen Larsen presents today for her B12 injection.  Patient tolerated the injection well and has no questions.    Administrations This Visit     cyanocobalamin (VITAMIN B12) injection 1,000 mcg     Admin Date 09/05/2023 Action Given Dose 1,000 mcg Route Intramuscular Documented By Jacklynn Bue, LPN             Jacklynn Bue, LPN  21/30/86 5:78 PM

## 2023-09-15 ENCOUNTER — Ambulatory Visit (INDEPENDENT_AMBULATORY_CARE_PROVIDER_SITE_OTHER): Payer: 59

## 2023-09-15 ENCOUNTER — Other Ambulatory Visit: Payer: Self-pay

## 2023-09-15 DIAGNOSIS — F411 Generalized anxiety disorder: Secondary | ICD-10-CM

## 2023-09-15 DIAGNOSIS — E538 Deficiency of other specified B group vitamins: Secondary | ICD-10-CM

## 2023-09-15 MED ORDER — ESCITALOPRAM OXALATE 10 MG PO TABS: 10.0000 mg | ORAL_TABLET | Freq: Every day | ORAL | 1 refills | Status: DC

## 2023-09-15 MED ORDER — CYANOCOBALAMIN 1000 MCG/ML IJ SOLN
1000.0000 ug | Freq: Once | INTRAMUSCULAR | Status: AC
  Administered 2023-09-15: 1000 ug via INTRAMUSCULAR

## 2023-09-22 DIAGNOSIS — R200 Anesthesia of skin: Secondary | ICD-10-CM | POA: Insufficient documentation

## 2023-09-22 DIAGNOSIS — E6609 Other obesity due to excess calories: Secondary | ICD-10-CM | POA: Insufficient documentation

## 2023-09-22 DIAGNOSIS — E66811 Obesity, class 1: Secondary | ICD-10-CM | POA: Insufficient documentation

## 2023-09-22 DIAGNOSIS — G8929 Other chronic pain: Secondary | ICD-10-CM | POA: Insufficient documentation

## 2023-09-22 NOTE — Assessment & Plan Note (Signed)
Prescribed Phentermine 37.5 Continue to monitor for abnormal side effects like increased BP, heart rate, or insomnia Will adjust treatment depending on symptoms

## 2023-09-22 NOTE — Assessment & Plan Note (Signed)
Controlled Continue taking protonix 40mg  Will adjust treatment depending on symptoms

## 2023-09-22 NOTE — Assessment & Plan Note (Signed)
Prescribed Mobic 10mg  Continue to monitor symptoms Will adjust treatment as needed

## 2023-09-22 NOTE — Assessment & Plan Note (Signed)
Controlled Continue taking Lexapro 10mg  Continue to monitor symptoms Will adjust treatment as needed

## 2023-09-22 NOTE — Assessment & Plan Note (Signed)
Well controlled.  Continue to work on eating a healthy diet and exercise.  Labs drawn today.   No major side effects reported, and no issues with compliance. The current medical regimen is effective;  continue present plan with Losartan 50mg  Will adjust medication as needed depending on labs BP Readings from Last 3 Encounters:  09/02/23 132/82  06/27/23 110/68  05/25/23 124/78

## 2023-09-23 ENCOUNTER — Ambulatory Visit (INDEPENDENT_AMBULATORY_CARE_PROVIDER_SITE_OTHER): Payer: 59

## 2023-09-23 DIAGNOSIS — E538 Deficiency of other specified B group vitamins: Secondary | ICD-10-CM | POA: Diagnosis not present

## 2023-09-23 MED ORDER — CYANOCOBALAMIN 1000 MCG/ML IJ SOLN
1000.0000 ug | Freq: Once | INTRAMUSCULAR | Status: AC
  Administered 2023-09-23: 1000 ug via INTRAMUSCULAR

## 2023-09-23 NOTE — Progress Notes (Signed)
Patient presents today for b12 injection. Injection given left deltoid.

## 2023-10-02 ENCOUNTER — Other Ambulatory Visit: Payer: Self-pay

## 2023-10-02 DIAGNOSIS — R899 Unspecified abnormal finding in specimens from other organs, systems and tissues: Secondary | ICD-10-CM

## 2023-10-03 ENCOUNTER — Other Ambulatory Visit: Payer: 59

## 2023-10-03 ENCOUNTER — Other Ambulatory Visit: Payer: Self-pay | Admitting: Physician Assistant

## 2023-10-03 ENCOUNTER — Encounter: Payer: Self-pay | Admitting: Physician Assistant

## 2023-10-03 DIAGNOSIS — Z6829 Body mass index (BMI) 29.0-29.9, adult: Secondary | ICD-10-CM

## 2023-10-03 DIAGNOSIS — I10 Essential (primary) hypertension: Secondary | ICD-10-CM

## 2023-10-03 DIAGNOSIS — R899 Unspecified abnormal finding in specimens from other organs, systems and tissues: Secondary | ICD-10-CM

## 2023-10-04 ENCOUNTER — Other Ambulatory Visit: Payer: Self-pay | Admitting: Physician Assistant

## 2023-10-04 DIAGNOSIS — I10 Essential (primary) hypertension: Secondary | ICD-10-CM

## 2023-10-04 DIAGNOSIS — G8929 Other chronic pain: Secondary | ICD-10-CM

## 2023-10-04 DIAGNOSIS — Z6829 Body mass index (BMI) 29.0-29.9, adult: Secondary | ICD-10-CM

## 2023-10-04 LAB — T4, FREE: Free T4: 0.87 ng/dL (ref 0.82–1.77)

## 2023-10-04 LAB — TSH: TSH: 1.95 u[IU]/mL (ref 0.450–4.500)

## 2023-10-15 ENCOUNTER — Other Ambulatory Visit: Payer: Self-pay | Admitting: Physician Assistant

## 2023-10-15 DIAGNOSIS — I10 Essential (primary) hypertension: Secondary | ICD-10-CM

## 2023-11-10 ENCOUNTER — Other Ambulatory Visit: Payer: Self-pay | Admitting: Physician Assistant

## 2023-11-10 DIAGNOSIS — Z6829 Body mass index (BMI) 29.0-29.9, adult: Secondary | ICD-10-CM

## 2023-11-10 DIAGNOSIS — I10 Essential (primary) hypertension: Secondary | ICD-10-CM

## 2023-11-21 ENCOUNTER — Other Ambulatory Visit: Payer: Self-pay

## 2023-11-21 DIAGNOSIS — Z780 Asymptomatic menopausal state: Secondary | ICD-10-CM

## 2023-11-21 MED ORDER — ESTRADIOL 0.5 MG PO TABS: 0.5000 mg | ORAL_TABLET | Freq: Every day | ORAL | 2 refills | Status: DC

## 2023-12-05 ENCOUNTER — Ambulatory Visit: Payer: 59 | Admitting: Physician Assistant

## 2023-12-06 NOTE — Progress Notes (Signed)
 Subjective:  Patient ID: Colleen Larsen, female    DOB: [birth year]  Age: 58 y.o. MRN: [id number]  Chief Complaint  Patient presents with   Medical Management of Chronic Issues    HPI   Hypertension: Patient is currently taking Losartan  50 mg 1 tablet daily. She denies any HA, dizziness, or issues with the medicine.  Hyperlipidemia:  Patient is currently taking Rosuvastatin  5 mg 1 tablet daily. Denies any joint pain or issues with the medicine  Anxiety: Patient is currently taking Lexapro  10mg . Feels her anxiety is well controlled and has not had any issues.   GERD: Controlled, continue to monitor symptoms and take protonix  40mg   Knee pain:  Has been trying to walk more to help loose weight along with monitoring her diet. She has since been having knee pain that is making it hard for her to walk. She has taken advil , ibuprofen , and tylenol  and it has not helped. She would like to try something new.  Hand swelling: She noticed about 2 days ago that her hands had began swelling. She denies itching, pain, discoloration. She has a little numbness and tingling that started about the same time.   Weight management: Has been working on her diet and exercise. She has been walking more, but that has lead to her having increased knee pain. She states the advil  and tylenol  have not been able to help her with her knee pain.   Sciatica: will try stretches and possibly gabapentin  if the pain continues. Showed her some stretches here in office that can help. States that after performing them they helped a little.   Discussed the use of AI scribe software for clinical note transcription with the patient, who gave verbal consent to proceed.  History of Present Illness   Colleen Larsen, a 58 year old female with a history of hypertension and hyperlipidemia, presents with multiple complaints. She reports numbness, tingling, and swelling in her arms and hands, which she attributes to quitting smoking  approximately a month ago. She also mentions experiencing a dry mouth. The numbness and tingling affect her entire arm and she describes her arms as feeling limp. She also notes swelling in her fingers. She has a history of two neck surgeries, the details of which are not specified, and she denies any recent neck injuries.  In addition to the above, Colleen Larsen also complains of sciatica, which has been ongoing for a while and seems to be worsening. She has been managing the pain with stretching exercises and meloxicam , but the relief is temporary. She works on her feet for twelve hours and finds that both prolonged standing and sitting exacerbate her symptoms.  Colleen Larsen also mentions her attempts to lose weight. She has tried phentermine  in the past but did not find it helpful in suppressing her appetite. She is planning a trip to [place] in September and is interested in managing her weight before the trip.         12/07/2023    8:12 AM 06/27/2023    3:40 PM 11/24/2022   10:01 AM 08/19/2022    8:29 AM 07/27/2022    8:24 AM  Depression screen PHQ 2/9  Decreased Interest 0 1 0 1 1  Down, Depressed, Hopeless 0 0 0 1 1  PHQ - 2 Score 0 1 0 2 2  Altered sleeping 1 2  1 1   Tired, decreased energy 1 2  1 1   Change in appetite 1 0  0 1  Feeling  bad or failure about yourself  0 0  0 1  Trouble concentrating 1 0  0 1  Moving slowly or fidgety/restless 0 0  0 0  Suicidal thoughts 0 0  0 0  PHQ-9 Score 4 5  4 7   Difficult doing work/chores Somewhat difficult Somewhat difficult  Somewhat difficult Not difficult at all        12/07/2023    8:11 AM  Fall Risk   Falls in the past year? 0  Number falls in past yr: 0  Injury with Fall? 0  Risk for fall due to : No Fall Risks  Follow up Falls evaluation completed    Patient Care Team: Milon Cleaves, [place] as PCP - General (Physician Assistant)   Review of Systems  Constitutional:  Negative for appetite change, fatigue and fever.  HENT:   Negative for congestion, ear pain, sinus pressure and sore throat.   Respiratory:  Negative for cough, chest tightness, shortness of breath and wheezing.   Cardiovascular:  Negative for chest pain and palpitations.  Gastrointestinal:  Negative for abdominal pain, constipation, diarrhea, nausea and vomiting.  Genitourinary:  Negative for dysuria and hematuria.  Musculoskeletal:  Negative for arthralgias, back pain, joint swelling and myalgias.  Skin:  Negative for rash.  Neurological:  Negative for dizziness, weakness and headaches.  Psychiatric/Behavioral:  Negative for dysphoric mood. The patient is not nervous/anxious.     Current Outpatient Medications on File Prior to Visit  Medication Sig Dispense Refill   acetaminophen  (TYLENOL ) 500 MG tablet Take 1 tablet (500 mg total) by mouth every 6 (six) hours as needed (pain). 30 tablet 0   albuterol  (VENTOLIN  HFA) 108 (90 Base) MCG/ACT inhaler Inhale 2 puffs into the lungs every 6 (six) hours as needed for wheezing or shortness of breath. 8 g 2   azelastine  (ASTELIN ) 0.1 % nasal spray Place 1 spray into both nostrils 2 (two) times daily. Use in each nostril as directed 30 mL 12   cetirizine (ZYRTEC) 10 MG tablet Take 10 mg by mouth daily as needed for allergies.      escitalopram  (LEXAPRO ) 10 MG tablet Take 1 tablet (10 mg total) by mouth at bedtime. 90 tablet 1   estradiol  (ESTRACE ) 0.5 MG tablet Take 1 tablet (0.5 mg total) by mouth daily. 90 tablet 2   fluticasone  (FLONASE ) 50 MCG/ACT nasal spray Place 2 sprays into both nostrils 2 (two) times daily as needed for allergies. 16 g 6   folic acid  (FOLVITE ) 1 MG tablet Take 1 tablet (1 mg total) by mouth daily. 30 tablet 3   hydrochlorothiazide  (HYDRODIURIL ) 25 MG tablet Take 1 tablet (25 mg total) by mouth daily. 90 tablet 3   losartan  (COZAAR ) 50 MG tablet TAKE 1 TABLET TWICE A DAY 180 tablet 1   Melatonin 10 MG TABS Take 10 mg by mouth at bedtime as needed (for sleep).     meloxicam  (MOBIC ) 15  MG tablet TAKE 1 TABLET(15 MG) BY MOUTH DAILY 30 tablet 3   montelukast  (SINGULAIR ) 10 MG tablet Take 1 tablet (10 mg total) by mouth at bedtime. 90 tablet 3   ondansetron  (ZOFRAN ) 4 MG tablet Take 1 tablet (4 mg total) by mouth every 8 (eight) hours as needed for nausea or vomiting. 20 tablet 0   pantoprazole  (PROTONIX ) 40 MG tablet TAKE 1 TABLET DAILY 90 tablet 1   phentermine  37.5 MG capsule TAKE 1 CAPSULE(37.5 MG) BY MOUTH EVERY MORNING AS DIRECTED 30 capsule 0   rosuvastatin  (CRESTOR )  5 MG tablet Take 1 tablet (5 mg total) by mouth at bedtime. 90 tablet 3   Trospium  Chloride 60 MG CP24 Take 1 capsule (60 mg total) by mouth daily. 90 capsule 3   Vitamin D , Ergocalciferol , (DRISDOL ) 1.25 MG (50000 UNIT) CAPS capsule Take 1 capsule (50,000 Units total) by mouth every 7 (seven) days. (Patient taking differently: Take 50,000 Units by mouth every 7 (seven) days. Saturday's) 12 capsule 1   cyanocobalamin  (VITAMIN B12) 1000 MCG/ML injection Inject 1 mL (1,000 mcg total) into the muscle once a week. (Patient not taking: Reported on 12/07/2023) 1 mL 3   No current facility-administered medications on file prior to visit.   Past Medical History:  Diagnosis Date   Allergic rhinitis, seasonal    COPD (chronic obstructive pulmonary disease) (HCC)    GAD (generalized anxiety disorder)    GERD (gastroesophageal reflux disease)    History of adenomatous polyp of colon    Hyperlipidemia, mixed    Hypertension    MDD (major depressive disorder)    Mixed stress and urge urinary incontinence    OA (osteoarthritis)    OAB (overactive bladder)    Tobacco user 12/07/2023   Wears glasses    Past Surgical History:  Procedure Laterality Date   ANTERIOR CERVICAL DECOMP/DISCECTOMY FUSION  02/14/2007   @[facility]  by dr pool   BILATERAL CARPAL TUNNLE RELEASE Bilateral    1990s   BLADDER SUSPENSION N/A 02/14/2023   Procedure: TRANSVAGINAL TAPE (TVT) PROCEDURE;  Surgeon: Marilynne Rosaline SAILOR, MD;  Location: [facility];  Service: Gynecology;  Laterality: N/A;  Total time requested is 1 hour   CERVICAL SPINE SURGERY  04/28/2010   @[facility]  by dr pool;   RE-EXPLORATION C5-6 FUSION / REMOVAL HARDWARE AND ACDF C6-7   CHOLECYSTECTOMY OPEN  1981   CYSTOSCOPY N/A 02/14/2023   Procedure: CYSTOSCOPY;  Surgeon: Marilynne Rosaline SAILOR, MD;  Location[facility];  Service: Gynecology;  Laterality: N/A;   DEBRIDEMENT TENNIS ELBOW Right    1990s   KNEE CLOSED REDUCTION Right 08/02/2018   Procedure: CLOSED MANIPULATION RIGHT TOTAL KNEE;  Surgeon: Heide Ingle, MD;  Location: [facility];  Service: Orthopedics;  Laterality: Right;    LAPAROSCOPIC ASSISTED VAGINAL HYSTERECTOMY  2003   TONSILLECTOMY     child   TOTAL KNEE ARTHROPLASTY Right 05/17/2018   Procedure: RIGHT TOTAL KNEE ARTHROPLASTY, INSERTION OF BONE GRAFT IN FEMORAL CANAL;  Surgeon: Heide Ingle, MD;  Location: [facility];  Service: Orthopedics;  Laterality: Right;   TUBAL LIGATION Bilateral    yrs ago    Family History  Problem Relation Age of Onset   Cancer Mother    Hypertension Mother    Hyperlipidemia Mother    Rectal cancer Mother    Hypertension Father    Hyperlipidemia Father    Heart Problems Father    Arthritis Father    COPD Father    Hypertension Brother    Hypertension Paternal Aunt    Heart Problems Paternal Aunt    Hypertension Paternal Aunt    Hyperlipidemia Paternal Aunt    Hypertension Paternal Aunt    Hyperlipidemia Paternal Aunt    Skin cancer Paternal Aunt    Heart Problems Paternal Aunt    Breast cancer Maternal Grandmother    Hypertension Paternal Grandmother    Heart Problems Paternal Grandmother    Hypertension Paternal Grandfather    Stroke Paternal Grandfather    Hyperlipidemia Paternal Grandfather    Colon cancer Neg Hx  Colon polyps Neg Hx    Esophageal cancer Neg Hx    Stomach cancer Neg Hx    Social History   Socioeconomic History   Marital status: Married    Spouse  name: Not on file   Number of children: Not on file   Years of education: Not on file   Highest education level: Not on file  Occupational History   Not on file  Tobacco Use   Smoking status: Former    Current packs/day: 0.00    Average packs/day: 1 pack/day for 30.0 years (30.0 ttl pk-yrs)    Types: Cigarettes    Quit date: 06/25/2023    Years since quitting: 0.4   Smokeless tobacco: Never  Vaping Use   Vaping status: Some Days   Substances: Nicotine, Flavoring   Devices: buse  Substance and Sexual Activity   Alcohol use: Yes    Comment: RARE   Drug use: Never   Sexual activity: Yes    Partners: Male    Birth control/protection: Surgical  Other Topics Concern   Not on file  Social History Narrative   Not on file   Social Drivers of Health   Financial Resource Strain: Low Risk  (07/27/2022)   Overall Financial Resource Strain (CARDIA)    Difficulty of Paying Living Expenses: Not hard at all  Food Insecurity: No Food Insecurity (07/27/2022)   Hunger Vital Sign    Worried About Running Out of Food in the Last Year: Never true    Ran Out of Food in the Last Year: Never true  Transportation Needs: No Transportation Needs (07/27/2022)   PRAPARE - Administrator, Civil Service (Medical): No    Lack of Transportation (Non-Medical): No  Physical Activity: Inactive (07/27/2022)   Exercise Vital Sign    Days of Exercise per Week: 0 days    Minutes of Exercise per Session: 0 min  Stress: No Stress Concern Present (07/27/2022)   [employer] of Occupational Health - Occupational Stress Questionnaire    Feeling of Stress : Not at all  Social Connections: Moderately Integrated (07/27/2022)   Social Connection and Isolation Panel [NHANES]    Frequency of Communication with Friends and Family: More than three times a week    Frequency of Social Gatherings with Friends and Family: More than three times a week    Attends Religious Services: 1 to 4 times per year     Active Member of [employer] or Organizations: No    Attends Engineer, Structural: Never    Marital Status: Married    Objective:  BP 130/78 (BP Location: Left Arm, Patient Position: Sitting, Cuff Size: Large)   Pulse 80   Temp 97.6 F (36.4 C) (Temporal)   Resp 16   Ht 5' 5 (1.651 m)   Wt 195 lb 6.4 oz (88.6 kg)   SpO2 98%   BMI 32.52 kg/m      12/07/2023    8:00 AM 09/02/2023    9:50 AM 06/27/2023    3:37 PM  BP/Weight  Systolic BP 130 132 110  Diastolic BP 78 82 68  Wt. (Lbs) 195.4 186 183.2  BMI 32.52 kg/m2 30.95 kg/m2 30.49 kg/m2    Physical Exam Vitals reviewed.  Constitutional:      Appearance: Normal appearance.  HENT:     Mouth/Throat:     Mouth: Mucous membranes are dry.     Tongue: No lesions. Tongue does not deviate from midline.     Palate:  No mass and lesions.     Tonsils: No tonsillar exudate or tonsillar abscesses.  Cardiovascular:     Rate and Rhythm: Normal rate and regular rhythm.     Heart sounds: Normal heart sounds.  Pulmonary:     Effort: Pulmonary effort is normal.     Breath sounds: Normal breath sounds.  Abdominal:     General: Bowel sounds are normal.     Palpations: Abdomen is soft.     Tenderness: There is no abdominal tenderness.  Musculoskeletal:        General: Swelling present.  Neurological:     Mental Status: She is alert and oriented to person, place, and time.  Psychiatric:        Mood and Affect: Mood normal.        Behavior: Behavior normal.     Diabetic Foot Exam - Simple   No data filed      Lab Results  Component Value Date   WBC 5.4 12/07/2023   HGB 12.0 12/07/2023   HCT 36.3 12/07/2023   PLT 192 12/07/2023   GLUCOSE 93 12/07/2023   CHOL 156 12/07/2023   TRIG 121 12/07/2023   HDL 46 12/07/2023   LDLCALC 88 12/07/2023   ALT 12 12/07/2023   AST 20 12/07/2023   NA 137 12/07/2023   K 3.8 12/07/2023   CL 98 12/07/2023   CREATININE 0.72 12/07/2023   BUN 18 12/07/2023   CO2 24 12/07/2023   TSH 1.850  12/07/2023   INR 0.87 05/10/2018   HGBA1C 6.2 (H) 09/01/2023      Assessment & Plan:    Primary hypertension Assessment & Plan: Well controlled.  Continue to work on eating a healthy diet and exercise.  Labs drawn today.   No major side effects reported, and no issues with compliance. The current medical regimen is effective;  continue present plan with Losartan  50mg , hydrochlorothiazide  25mg  Will adjust medication as needed depending on labs BP Readings from Last 3 Encounters:  12/07/23 130/78  09/02/23 132/82  06/27/23 110/68     Orders: -     CBC with Differential/Platelet -     Comprehensive metabolic panel  GAD (generalized anxiety disorder) Assessment & Plan: Controlled Denies any new or worsening symptoms Continue taking Lexapro  10mg  as prescribed Continue to monitor symptoms Will adjust treatment as needed depending on symptoms   Mixed hyperlipidemia Assessment & Plan: LDL controlled at 80-90 with Crestor  5mg . -Consider increasing Crestor  dose if LDL remains high on today's labs. Lab Results  Component Value Date   LDLCALC 88 12/07/2023     Orders: -     Lipid panel  Vitamin D  deficiency Assessment & Plan: On supplementation, will check levels today. -Continue current supplementation.  Orders: -     VITAMIN D  25 Hydroxy (Vit-D Deficiency, Fractures)  Gastroesophageal reflux disease, unspecified whether esophagitis present Assessment & Plan: Controlled Continue taking protonix  40mg  as directed Continue to monitor symptoms Will adjust treatment depending on symptoms   Postmenopausal estrogen deficiency Assessment & Plan: Continue to monitor symptoms Will adjust treatment as needed   Abnormal thyroid blood test Assessment & Plan: Labs drawn today Continue to monitor for any worsening symptoms Will adjust treatment depending on labs  Orders: -     TSH -     T4, free  B12 deficiency Assessment & Plan: Labs drawn today Will adjust  treatment depending on labs  Orders: -     B12 and Folate Panel  Bilateral sciatica Assessment &  Plan: Chronic issue, exacerbated by prolonged sitting. Currently managed with Meloxicam . -Administer Kenalog  injection today for inflammation. -Consider physical therapy if symptoms persist or worsen.  Orders: -     Triamcinolone  Acetonide  Numbness and tingling in both hands Assessment & Plan: Reports numbness and tingling in the arm, possibly due to muscle tension or nerve impingement. No worsening of symptoms with neck manipulation. Strength and reflexes are normal. -Recommend deep tissue massage for muscle tension. -Continue to monitor symptoms. -Will consider referral to neurology or orthopedics if symptoms continue -Will get an x-ray to look at cervical spine if symptoms continue   Class 1 obesity due to excess calories with serious comorbidity and body mass index (BMI) of 32.0 to 32.9 in adult Assessment & Plan: Discussed potential for weight loss medication or procedure. Patient meets criteria with BMI of 32 and comorbidities of hypertension and hyperlipidemia. -Explore insurance coverage for weight management interventions.      Meds ordered this encounter  Medications   triamcinolone  acetonide (KENALOG -40) injection 80 mg    Orders Placed This Encounter  Procedures   CBC with Differential/Platelet   Comprehensive metabolic panel   Lipid panel   TSH   B12 and Folate Panel   T4, free   VITAMIN D  25 Hydroxy (Vit-D Deficiency, Fractures)     Follow-up: Return in about 3 months (around 03/05/2024) for Chronic, Nola.   I,Lauren M Auman,acting as a neurosurgeon for [employer], PA.,have documented all relevant documentation on the behalf of Nola Angles, PA,as directed by  Nola Angles, PA while in the presence of Nola Angles, [place].   An After Visit Summary was printed and given to the patient.  Nola Angles, [place] [facility] [phone]

## 2023-12-07 ENCOUNTER — Ambulatory Visit: Payer: 59 | Admitting: Physician Assistant

## 2023-12-07 ENCOUNTER — Encounter: Payer: Self-pay | Admitting: Physician Assistant

## 2023-12-07 VITALS — BP 130/78 | HR 80 | Temp 97.6°F | Resp 16 | Ht 65.0 in | Wt 195.4 lb

## 2023-12-07 DIAGNOSIS — I10 Essential (primary) hypertension: Secondary | ICD-10-CM

## 2023-12-07 DIAGNOSIS — F411 Generalized anxiety disorder: Secondary | ICD-10-CM

## 2023-12-07 DIAGNOSIS — E559 Vitamin D deficiency, unspecified: Secondary | ICD-10-CM

## 2023-12-07 DIAGNOSIS — K219 Gastro-esophageal reflux disease without esophagitis: Secondary | ICD-10-CM

## 2023-12-07 DIAGNOSIS — Z6832 Body mass index (BMI) 32.0-32.9, adult: Secondary | ICD-10-CM

## 2023-12-07 DIAGNOSIS — K5900 Constipation, unspecified: Secondary | ICD-10-CM | POA: Insufficient documentation

## 2023-12-07 DIAGNOSIS — E782 Mixed hyperlipidemia: Secondary | ICD-10-CM | POA: Diagnosis not present

## 2023-12-07 DIAGNOSIS — E538 Deficiency of other specified B group vitamins: Secondary | ICD-10-CM

## 2023-12-07 DIAGNOSIS — R202 Paresthesia of skin: Secondary | ICD-10-CM

## 2023-12-07 DIAGNOSIS — R200 Anesthesia of skin: Secondary | ICD-10-CM

## 2023-12-07 DIAGNOSIS — R7989 Other specified abnormal findings of blood chemistry: Secondary | ICD-10-CM

## 2023-12-07 DIAGNOSIS — Z780 Asymptomatic menopausal state: Secondary | ICD-10-CM

## 2023-12-07 DIAGNOSIS — M5431 Sciatica, right side: Secondary | ICD-10-CM | POA: Diagnosis not present

## 2023-12-07 DIAGNOSIS — M5432 Sciatica, left side: Secondary | ICD-10-CM

## 2023-12-07 DIAGNOSIS — E876 Hypokalemia: Secondary | ICD-10-CM | POA: Insufficient documentation

## 2023-12-07 DIAGNOSIS — E66811 Obesity, class 1: Secondary | ICD-10-CM

## 2023-12-07 DIAGNOSIS — E8881 Metabolic syndrome: Secondary | ICD-10-CM | POA: Insufficient documentation

## 2023-12-07 DIAGNOSIS — M1710 Unilateral primary osteoarthritis, unspecified knee: Secondary | ICD-10-CM | POA: Insufficient documentation

## 2023-12-07 DIAGNOSIS — Z720 Tobacco use: Secondary | ICD-10-CM | POA: Insufficient documentation

## 2023-12-07 DIAGNOSIS — E669 Obesity, unspecified: Secondary | ICD-10-CM | POA: Insufficient documentation

## 2023-12-07 HISTORY — DX: Tobacco use: Z72.0

## 2023-12-07 MED ORDER — TRIAMCINOLONE ACETONIDE 40 MG/ML IJ SUSP
80.0000 mg | Freq: Once | INTRAMUSCULAR | Status: AC
  Administered 2023-12-07: 80 mg via INTRAMUSCULAR

## 2023-12-08 ENCOUNTER — Encounter: Payer: Self-pay | Admitting: Physician Assistant

## 2023-12-08 DIAGNOSIS — R7989 Other specified abnormal findings of blood chemistry: Secondary | ICD-10-CM | POA: Insufficient documentation

## 2023-12-08 DIAGNOSIS — E538 Deficiency of other specified B group vitamins: Secondary | ICD-10-CM | POA: Insufficient documentation

## 2023-12-08 LAB — CBC WITH DIFFERENTIAL/PLATELET
Basophils Absolute: 0 10*3/uL (ref 0.0–0.2)
Basos: 1 %
EOS (ABSOLUTE): 0.1 10*3/uL (ref 0.0–0.4)
Eos: 2 %
Hematocrit: 36.3 % (ref 34.0–46.6)
Hemoglobin: 12 g/dL (ref 11.1–15.9)
Immature Grans (Abs): 0 10*3/uL (ref 0.0–0.1)
Immature Granulocytes: 0 %
Lymphocytes Absolute: 1.5 10*3/uL (ref 0.7–3.1)
Lymphs: 27 %
MCH: 28.2 pg (ref 26.6–33.0)
MCHC: 33.1 g/dL (ref 31.5–35.7)
MCV: 85 fL (ref 79–97)
Monocytes Absolute: 0.5 10*3/uL (ref 0.1–0.9)
Monocytes: 9 %
Neutrophils Absolute: 3.3 10*3/uL (ref 1.4–7.0)
Neutrophils: 61 %
Platelets: 192 10*3/uL (ref 150–450)
RBC: 4.25 x10E6/uL (ref 3.77–5.28)
RDW: 13.3 % (ref 11.7–15.4)
WBC: 5.4 10*3/uL (ref 3.4–10.8)

## 2023-12-08 LAB — COMPREHENSIVE METABOLIC PANEL
ALT: 12 [IU]/L (ref 0–32)
AST: 20 [IU]/L (ref 0–40)
Albumin: 4.2 g/dL (ref 3.8–4.9)
Alkaline Phosphatase: 98 [IU]/L (ref 44–121)
BUN/Creatinine Ratio: 25 — ABNORMAL HIGH (ref 9–23)
BUN: 18 mg/dL (ref 6–24)
Bilirubin Total: 0.4 mg/dL (ref 0.0–1.2)
CO2: 24 mmol/L (ref 20–29)
Calcium: 8.9 mg/dL (ref 8.7–10.2)
Chloride: 98 mmol/L (ref 96–106)
Creatinine, Ser: 0.72 mg/dL (ref 0.57–1.00)
Globulin, Total: 2.4 g/dL (ref 1.5–4.5)
Glucose: 93 mg/dL (ref 70–99)
Potassium: 3.8 mmol/L (ref 3.5–5.2)
Sodium: 137 mmol/L (ref 134–144)
Total Protein: 6.6 g/dL (ref 6.0–8.5)
eGFR: 96 mL/min/{1.73_m2} (ref >59)

## 2023-12-08 LAB — LIPID PANEL
Chol/HDL Ratio: 3.4 {ratio} (ref 0.0–4.4)
Cholesterol, Total: 156 mg/dL (ref 100–199)
HDL: 46 mg/dL (ref >39)
LDL Chol Calc (NIH): 88 mg/dL (ref 0–99)
Triglycerides: 121 mg/dL (ref 0–149)
VLDL Cholesterol Cal: 22 mg/dL (ref 5–40)

## 2023-12-08 LAB — VITAMIN D 25 HYDROXY (VIT D DEFICIENCY, FRACTURES): Vit D, 25-Hydroxy: 38.3 ng/mL (ref 30.0–100.0)

## 2023-12-08 LAB — TSH: TSH: 1.85 u[IU]/mL (ref 0.450–4.500)

## 2023-12-08 LAB — T4, FREE: Free T4: 0.8 ng/dL — ABNORMAL LOW (ref 0.82–1.77)

## 2023-12-08 LAB — B12 AND FOLATE PANEL
Folate: >20 ng/mL (ref >3.0)
Vitamin B-12: 286 pg/mL (ref 232–1245)

## 2023-12-08 NOTE — Assessment & Plan Note (Signed)
Continue to monitor symptoms Will adjust treatment as needed

## 2023-12-08 NOTE — Assessment & Plan Note (Signed)
 Labs drawn today Will adjust treatment depending on labs

## 2023-12-08 NOTE — Assessment & Plan Note (Addendum)
 LDL controlled at 80-90 with Crestor  5mg . -Consider increasing Crestor  dose if LDL remains high on today's labs. Lab Results  Component Value Date   LDLCALC 88 12/07/2023

## 2023-12-08 NOTE — Assessment & Plan Note (Signed)
 Controlled Denies any new or worsening symptoms Continue taking Lexapro  10mg  as prescribed Continue to monitor symptoms Will adjust treatment as needed depending on symptoms

## 2023-12-08 NOTE — Assessment & Plan Note (Signed)
 Controlled Continue taking protonix  40mg  as directed Continue to monitor symptoms Will adjust treatment depending on symptoms

## 2023-12-08 NOTE — Assessment & Plan Note (Signed)
 Well controlled.  Continue to work on eating a healthy diet and exercise.  Labs drawn today.   No major side effects reported, and no issues with compliance. The current medical regimen is effective;  continue present plan with Losartan  50mg , hydrochlorothiazide  25mg  Will adjust medication as needed depending on labs BP Readings from Last 3 Encounters:  12/07/23 130/78  09/02/23 132/82  06/27/23 110/68

## 2023-12-08 NOTE — Assessment & Plan Note (Signed)
 Reports numbness and tingling in the arm, possibly due to muscle tension or nerve impingement. No worsening of symptoms with neck manipulation. Strength and reflexes are normal. -Recommend deep tissue massage for muscle tension. -Continue to monitor symptoms. -Will consider referral to neurology or orthopedics if symptoms continue -Will get an x-ray to look at cervical spine if symptoms continue

## 2023-12-08 NOTE — Assessment & Plan Note (Signed)
 On supplementation, will check levels today. -Continue current supplementation.

## 2023-12-08 NOTE — Assessment & Plan Note (Signed)
 Labs drawn today Continue to monitor for any worsening symptoms Will adjust treatment depending on labs

## 2023-12-08 NOTE — Assessment & Plan Note (Signed)
 Discussed potential for weight loss medication or procedure. Patient meets criteria with BMI of 32 and comorbidities of hypertension and hyperlipidemia. -Explore insurance coverage for weight management interventions.

## 2023-12-08 NOTE — Assessment & Plan Note (Signed)
 Chronic issue, exacerbated by prolonged sitting. Currently managed with Meloxicam . -Administer Kenalog  injection today for inflammation. -Consider physical therapy if symptoms persist or worsen.

## 2023-12-09 ENCOUNTER — Encounter: Payer: Self-pay | Admitting: Physician Assistant

## 2023-12-13 ENCOUNTER — Other Ambulatory Visit: Payer: Self-pay

## 2023-12-13 ENCOUNTER — Other Ambulatory Visit: Payer: Self-pay | Admitting: Physician Assistant

## 2023-12-13 DIAGNOSIS — E782 Mixed hyperlipidemia: Secondary | ICD-10-CM

## 2023-12-13 DIAGNOSIS — I10 Essential (primary) hypertension: Secondary | ICD-10-CM

## 2023-12-13 DIAGNOSIS — E6609 Other obesity due to excess calories: Secondary | ICD-10-CM

## 2023-12-13 MED ORDER — WEGOVY 0.25 MG/0.5ML ~~LOC~~ SOAJ: 0.2500 mg | SUBCUTANEOUS | 0 refills | Status: DC

## 2023-12-13 MED ORDER — SEMAGLUTIDE-WEIGHT MANAGEMENT 0.25 MG/0.5ML ~~LOC~~ SOAJ: 0.2500 mg | SUBCUTANEOUS | 1 refills | Status: DC

## 2023-12-13 MED ORDER — SEMAGLUTIDE-WEIGHT MANAGEMENT 0.25 MG/0.5ML ~~LOC~~ SOAJ
0.2500 mg | SUBCUTANEOUS | 1 refills | Status: DC
Start: 2023-12-13 — End: 2023-12-13

## 2023-12-16 ENCOUNTER — Other Ambulatory Visit: Payer: Self-pay | Admitting: Physician Assistant

## 2023-12-16 DIAGNOSIS — E782 Mixed hyperlipidemia: Secondary | ICD-10-CM

## 2023-12-16 DIAGNOSIS — E6609 Other obesity due to excess calories: Secondary | ICD-10-CM

## 2023-12-16 DIAGNOSIS — I10 Essential (primary) hypertension: Secondary | ICD-10-CM

## 2023-12-16 MED ORDER — SEMAGLUTIDE-WEIGHT MANAGEMENT 0.25 MG/0.5ML ~~LOC~~ SOAJ
0.2500 mg | SUBCUTANEOUS | 1 refills | Status: DC
Start: 2023-12-16 — End: 2023-12-22

## 2023-12-22 ENCOUNTER — Other Ambulatory Visit: Payer: Self-pay

## 2023-12-22 DIAGNOSIS — I10 Essential (primary) hypertension: Secondary | ICD-10-CM

## 2023-12-22 DIAGNOSIS — E66811 Obesity, class 1: Secondary | ICD-10-CM

## 2023-12-22 DIAGNOSIS — E782 Mixed hyperlipidemia: Secondary | ICD-10-CM

## 2023-12-22 MED ORDER — SEMAGLUTIDE-WEIGHT MANAGEMENT 0.25 MG/0.5ML ~~LOC~~ SOAJ: 0.2500 mg | SUBCUTANEOUS | 1 refills | Status: DC

## 2024-01-09 ENCOUNTER — Telehealth: Payer: Self-pay

## 2024-01-09 NOTE — Telephone Encounter (Signed)
 Prior Authorization was done through patient's insurance for [employer]. Patient's insurance approved the [facility] for dates 12/28/23 to 07/27/24.

## 2024-01-13 ENCOUNTER — Other Ambulatory Visit: Payer: Self-pay | Admitting: Family Medicine

## 2024-01-13 ENCOUNTER — Other Ambulatory Visit: Payer: Self-pay | Admitting: Physician Assistant

## 2024-01-13 DIAGNOSIS — K219 Gastro-esophageal reflux disease without esophagitis: Secondary | ICD-10-CM

## 2024-01-13 DIAGNOSIS — N952 Postmenopausal atrophic vaginitis: Secondary | ICD-10-CM

## 2024-01-13 DIAGNOSIS — J309 Allergic rhinitis, unspecified: Secondary | ICD-10-CM

## 2024-02-07 ENCOUNTER — Other Ambulatory Visit: Payer: Self-pay | Admitting: Physician Assistant

## 2024-02-07 DIAGNOSIS — N952 Postmenopausal atrophic vaginitis: Secondary | ICD-10-CM

## 2024-02-21 ENCOUNTER — Other Ambulatory Visit: Payer: Self-pay | Admitting: Physician Assistant

## 2024-02-21 DIAGNOSIS — G8929 Other chronic pain: Secondary | ICD-10-CM

## 2024-03-05 NOTE — Progress Notes (Unsigned)
 Subjective:  Patient ID: Colleen Larsen, female    DOB: 1966-08-21  Age: 58 y.o. MRN: [id number]  No chief complaint on file.   HPI:  Hypertension: Patient is currently taking Losartan  50 mg 1 tablet daily. She denies any HA, dizziness, or issues with the medicine.  Hyperlipidemia:  Patient is currently taking Rosuvastatin  5 mg 1 tablet daily. Denies any joint pain or issues with the medicine  Anxiety: Patient is currently taking Lexapro  10mg . Feels her anxiety is well controlled and has not had any issues.   GERD: Controlled, continue to monitor symptoms and take protonix  40mg   Prediabetes: Last A1C 6.2%     12/07/2023    8:12 AM 06/27/2023    3:40 PM 11/24/2022   10:01 AM 08/19/2022    8:29 AM 07/27/2022    8:24 AM  Depression screen PHQ 2/9  Decreased Interest 0 1 0 1 1  Down, Depressed, Hopeless 0 0 0 1 1  PHQ - 2 Score 0 1 0 2 2  Altered sleeping 1 2  1 1   Tired, decreased energy 1 2  1 1   Change in appetite 1 0  0 1  Feeling bad or failure about yourself  0 0  0 1  Trouble concentrating 1 0  0 1  Moving slowly or fidgety/restless 0 0  0 0  Suicidal thoughts 0 0  0 0  PHQ-9 Score 4 5  4 7   Difficult doing work/chores Somewhat difficult Somewhat difficult  Somewhat difficult Not difficult at all        12/07/2023    8:11 AM  Fall Risk   Falls in the past year? 0  Number falls in past yr: 0  Injury with Fall? 0  Risk for fall due to : No Fall Risks  Follow up Falls evaluation completed    Patient Care Team: Odilia Bennett, [place] as PCP - General (Physician Assistant)   Review of Systems  Current Outpatient Medications on File Prior to Visit  Medication Sig Dispense Refill   acetaminophen  (TYLENOL ) 500 MG tablet Take 1 tablet (500 mg total) by mouth every 6 (six) hours as needed (pain). 30 tablet 0   albuterol  (VENTOLIN  HFA) 108 (90 Base) MCG/ACT inhaler Inhale 2 puffs into the lungs every 6 (six) hours as needed for wheezing or shortness of breath. 8 g 2    azelastine  (ASTELIN ) 0.1 % nasal spray Place 1 spray into both nostrils 2 (two) times daily. Use in each nostril as directed 30 mL 12   cetirizine (ZYRTEC) 10 MG tablet Take 10 mg by mouth daily as needed for allergies.      cyanocobalamin  (VITAMIN B12) 1000 MCG/ML injection Inject 1 mL (1,000 mcg total) into the muscle once a week. (Patient not taking: Reported on 12/07/2023) 1 mL 3   escitalopram  (LEXAPRO ) 10 MG tablet Take 1 tablet (10 mg total) by mouth at bedtime. 90 tablet 1   estradiol  (ESTRACE ) 0.5 MG tablet TAKE 1 TABLET DAILY 90 tablet 2   fluticasone  (FLONASE ) 50 MCG/ACT nasal spray Place 2 sprays into both nostrils 2 (two) times daily as needed for allergies. 16 g 6   folic acid  (FOLVITE ) 1 MG tablet Take 1 tablet (1 mg total) by mouth daily. 30 tablet 3   hydrochlorothiazide  (HYDRODIURIL ) 25 MG tablet Take 1 tablet (25 mg total) by mouth daily. 90 tablet 3   losartan  (COZAAR ) 50 MG tablet TAKE 1 TABLET TWICE A DAY 180 tablet 1   Melatonin 10  MG TABS Take 10 mg by mouth at bedtime as needed (for sleep).     meloxicam  (MOBIC ) 15 MG tablet TAKE 1 TABLET(15 MG) BY MOUTH DAILY 30 tablet 3   montelukast  (SINGULAIR ) 10 MG tablet TAKE 1 TABLET AT BEDTIME 90 tablet 3   ondansetron  (ZOFRAN ) 4 MG tablet Take 1 tablet (4 mg total) by mouth every 8 (eight) hours as needed for nausea or vomiting. 20 tablet 0   pantoprazole  (PROTONIX ) 40 MG tablet TAKE 1 TABLET DAILY 90 tablet 1   phentermine  37.5 MG capsule TAKE 1 CAPSULE(37.5 MG) BY MOUTH EVERY MORNING AS DIRECTED 30 capsule 0   rosuvastatin  (CRESTOR ) 5 MG tablet Take 1 tablet (5 mg total) by mouth at bedtime. 90 tablet 3   Semaglutide -Weight Management 0.25 MG/0.5ML SOAJ Inject 0.25 mg into the skin once a week. 2 mL 1   Trospium  Chloride 60 MG CP24 Take 1 capsule (60 mg total) by mouth daily. 90 capsule 3   Vitamin D , Ergocalciferol , (DRISDOL ) 1.25 MG (50000 UNIT) CAPS capsule Take 1 capsule (50,000 Units total) by mouth every 7 (seven) days.  (Patient taking differently: Take 50,000 Units by mouth every 7 (seven) days. Saturday's) 12 capsule 1   No current facility-administered medications on file prior to visit.   Past Medical History:  Diagnosis Date   Allergic rhinitis, seasonal    COPD (chronic obstructive pulmonary disease) (HCC)    GAD (generalized anxiety disorder)    GERD (gastroesophageal reflux disease)    History of adenomatous polyp of colon    Hyperlipidemia, mixed    Hypertension    MDD (major depressive disorder)    Mixed stress and urge urinary incontinence    OA (osteoarthritis)    OAB (overactive bladder)    Tobacco user 12/07/2023   Wears glasses    Past Surgical History:  Procedure Laterality Date   ANTERIOR CERVICAL DECOMP/DISCECTOMY FUSION  02/14/2007   @[facility]  by dr pool   BILATERAL CARPAL TUNNLE RELEASE Bilateral    1990s   BLADDER SUSPENSION N/A 02/14/2023   Procedure: TRANSVAGINAL TAPE (TVT) PROCEDURE;  Surgeon: Arma Lamp, MD;  Location: [facility]  Service: Gynecology;  Laterality: N/A;  Total time requested is 1 hour   CERVICAL SPINE SURGERY  04/28/2010   @[facility]  by dr pool;   RE-EXPLORATION C5-6 FUSION / REMOVAL HARDWARE AND ACDF C6-7   CHOLECYSTECTOMY OPEN  1981   CYSTOSCOPY N/A 02/14/2023   Procedure: CYSTOSCOPY;  Surgeon: Arma Lamp, MD;  Location: [facility];  Service: Gynecology;  Laterality: N/A;   DEBRIDEMENT TENNIS ELBOW Right    1990s   KNEE CLOSED REDUCTION Right 08/02/2018   Procedure: CLOSED MANIPULATION RIGHT TOTAL KNEE;  Surgeon: Hazle Lites, MD;  Location: [facility];  Service: Orthopedics;  Laterality: Right;    LAPAROSCOPIC ASSISTED VAGINAL HYSTERECTOMY  2003   TONSILLECTOMY     child   TOTAL KNEE ARTHROPLASTY Right 05/17/2018   Procedure: RIGHT TOTAL KNEE ARTHROPLASTY, INSERTION OF BONE GRAFT IN FEMORAL CANAL;  Surgeon: Hazle Lites, MD;  Location: [facility];  Service: Orthopedics;  Laterality: Right;   TUBAL  LIGATION Bilateral    yrs ago    Family History  Problem Relation Age of Onset   Cancer Mother    Hypertension Mother    Hyperlipidemia Mother    Rectal cancer Mother    Hypertension Father    Hyperlipidemia Father    Heart Problems Father    Arthritis Father    COPD  Father    Hypertension Brother    Hypertension Paternal Aunt    Heart Problems Paternal Aunt    Hypertension Paternal Aunt    Hyperlipidemia Paternal Aunt    Hypertension Paternal Aunt    Hyperlipidemia Paternal Aunt    Skin cancer Paternal Aunt    Heart Problems Paternal Aunt    Breast cancer Maternal Grandmother    Hypertension Paternal Grandmother    Heart Problems Paternal Grandmother    Hypertension Paternal Grandfather    Stroke Paternal Grandfather    Hyperlipidemia Paternal Grandfather    Colon cancer Neg Hx    Colon polyps Neg Hx    Esophageal cancer Neg Hx    Stomach cancer Neg Hx    Social History   Socioeconomic History   Marital status: Married    Spouse name: Not on file   Number of children: Not on file   Years of education: Not on file   Highest education level: Not on file  Occupational History   Not on file  Tobacco Use   Smoking status: Former    Current packs/day: 0.00    Average packs/day: 1 pack/day for 30.0 years (30.0 ttl pk-yrs)    Types: Cigarettes    Quit date: 06/25/2023    Years since quitting: 0.6   Smokeless tobacco: Never  Vaping Use   Vaping status: Some Days   Substances: Nicotine, Flavoring   Devices: buse  Substance and Sexual Activity   Alcohol use: Yes    Comment: RARE   Drug use: Never   Sexual activity: Yes    Partners: Male    Birth control/protection: Surgical  Other Topics Concern   Not on file  Social History Narrative   Not on file   Social Drivers of Health   Financial Resource Strain: Low Risk  (07/27/2022)   Overall Financial Resource Strain (CARDIA)    Difficulty of Paying Living Expenses: Not hard at all  Food Insecurity: No Food  Insecurity (07/27/2022)   Hunger Vital Sign    Worried About Running Out of Food in the Last Year: Never true    Ran Out of Food in the Last Year: Never true  Transportation Needs: No Transportation Needs (07/27/2022)   PRAPARE - Administrator, Civil Service (Medical): No    Lack of Transportation (Non-Medical): No  Physical Activity: Inactive (07/27/2022)   Exercise Vital Sign    Days of Exercise per Week: 0 days    Minutes of Exercise per Session: 0 min  Stress: No Stress Concern Present (07/27/2022)   [employer] of Occupational Health - Occupational Stress Questionnaire    Feeling of Stress : Not at all  Social Connections: Moderately Integrated (07/27/2022)   Social Connection and Isolation Panel [NHANES]    Frequency of Communication with Friends and Family: More than three times a week    Frequency of Social Gatherings with Friends and Family: More than three times a week    Attends Religious Services: 1 to 4 times per year    Active Member of [employer] or Organizations: No    Attends Banker Meetings: Never    Marital Status: Married    Objective:  There were no vitals taken for this visit.     12/07/2023    8:00 AM 09/02/2023    9:50 AM 06/27/2023    3:37 PM  BP/Weight  Systolic BP 130 132 110  Diastolic BP 78 82 68  Wt. (Lbs) 195.4 186 183.2  BMI 32.52 kg/m2 30.95 kg/m2 30.49 kg/m2    Physical Exam  Diabetic Foot Exam - Simple   No data filed      Lab Results  Component Value Date   WBC 5.4 12/07/2023   HGB 12.0 12/07/2023   HCT 36.3 12/07/2023   PLT 192 12/07/2023   GLUCOSE 93 12/07/2023   CHOL 156 12/07/2023   TRIG 121 12/07/2023   HDL 46 12/07/2023   LDLCALC 88 12/07/2023   ALT 12 12/07/2023   AST 20 12/07/2023   NA 137 12/07/2023   K 3.8 12/07/2023   CL 98 12/07/2023   CREATININE 0.72 12/07/2023   BUN 18 12/07/2023   CO2 24 12/07/2023   TSH 1.850 12/07/2023   INR 0.87 05/10/2018   HGBA1C 6.2 (H) 09/01/2023       Assessment & Plan:  There are no diagnoses linked to this encounter.   No orders of the defined types were placed in this encounter.   No orders of the defined types were placed in this encounter.    Follow-up: No follow-ups on file.   I,Marla I Leal-Borjas,acting as a scribe for [employer], PA.,have documented all relevant documentation on the behalf of Odilia Bennett, PA,as directed by  Odilia Bennett, PA while in the presence of Odilia Bennett, [place].   An After Visit Summary was printed and given to the patient.  Odilia Bennett, [place] [facility] [phone]

## 2024-03-06 ENCOUNTER — Encounter: Payer: Self-pay | Admitting: Physician Assistant

## 2024-03-06 ENCOUNTER — Ambulatory Visit: Payer: 59 | Admitting: Physician Assistant

## 2024-03-06 ENCOUNTER — Ambulatory Visit (INDEPENDENT_AMBULATORY_CARE_PROVIDER_SITE_OTHER)
Admission: RE | Admit: 2024-03-06 | Discharge: 2024-03-06 | Disposition: A | Discharge status: Home / Self Care | Source: Ambulatory Visit | Attending: Physician Assistant | Admitting: Physician Assistant

## 2024-03-06 VITALS — BP 111/68 | HR 69 | Temp 97.8°F | Resp 16 | Ht 65.0 in | Wt 195.0 lb

## 2024-03-06 DIAGNOSIS — I10 Essential (primary) hypertension: Secondary | ICD-10-CM | POA: Diagnosis not present

## 2024-03-06 DIAGNOSIS — M25561 Pain in right knee: Secondary | ICD-10-CM

## 2024-03-06 DIAGNOSIS — G8929 Other chronic pain: Secondary | ICD-10-CM

## 2024-03-06 DIAGNOSIS — M5431 Sciatica, right side: Secondary | ICD-10-CM

## 2024-03-06 DIAGNOSIS — E782 Mixed hyperlipidemia: Secondary | ICD-10-CM

## 2024-03-06 DIAGNOSIS — J301 Allergic rhinitis due to pollen: Secondary | ICD-10-CM

## 2024-03-06 DIAGNOSIS — M2570 Osteophyte, unspecified joint: Secondary | ICD-10-CM

## 2024-03-06 DIAGNOSIS — K219 Gastro-esophageal reflux disease without esophagitis: Secondary | ICD-10-CM | POA: Diagnosis not present

## 2024-03-06 DIAGNOSIS — E6609 Other obesity due to excess calories: Secondary | ICD-10-CM

## 2024-03-06 DIAGNOSIS — F411 Generalized anxiety disorder: Secondary | ICD-10-CM | POA: Diagnosis not present

## 2024-03-06 DIAGNOSIS — M1712 Unilateral primary osteoarthritis, left knee: Secondary | ICD-10-CM

## 2024-03-06 DIAGNOSIS — M5432 Sciatica, left side: Secondary | ICD-10-CM

## 2024-03-06 DIAGNOSIS — E66811 Obesity, class 1: Secondary | ICD-10-CM

## 2024-03-06 MED ORDER — LORATADINE 10 MG PO TABS: 10.0000 mg | ORAL_TABLET | Freq: Every day | ORAL | 11 refills | Status: DC

## 2024-03-06 MED ORDER — WEGOVY 0.5 MG/0.5ML ~~LOC~~ SOAJ
0.5000 mg | SUBCUTANEOUS | 1 refills | Status: DC
Start: 2024-03-06 — End: 2024-05-09

## 2024-03-06 NOTE — Patient Instructions (Signed)
 VISIT SUMMARY:  During your visit, we discussed your ongoing issues with weight management, knee pain, a growing knot on your head, and allergies. We also reviewed your sciatica, which has improved since your last visit.  YOUR PLAN:  -WEIGHT MANAGEMENT: You are using Wegovy  for weight management but have not experienced weight loss and have gained a few pounds. We will continue to monitor your progress and discuss further options if needed.  -KNEE PAIN WITH SWELLING POST-REPLACEMENT: You have chronic knee pain and swelling in your replaced knee, which may involve nerve issues. We will refer you to an orthopedic specialist for further evaluation and order a knee x-ray to check the structural integrity of your knee. If the x-ray is normal, we will consider nerve involvement.  -KNEE OSTEOARTHRITIS: You have chronic knee osteoarthritis with worsening symptoms. We will administer a knee injection to help manage your symptoms and delay the need for surgery. Future injections may be considered based on how your symptoms progress.  -ALLERGIC RHINITIS: Your allergic rhinitis is well-controlled with montelukast  and Flonase , but you have noticed some memory issues possibly related to montelukast . We will discontinue montelukast  and start you on loratadine  (Claritin ) for allergy management. Continue using Flonase  for nasal symptoms. If you develop a sinus infection, we may consider prescribing a Z-Pak.  INSTRUCTIONS:  Please follow up with the orthopedic specialist as referred and get the knee x-ray done as soon as possible. Continue with your current medications and start taking loratadine  (Claritin ) instead of montelukast . If you notice any new or worsening symptoms, please contact our office.

## 2024-03-06 NOTE — Assessment & Plan Note (Signed)
 Sciatica well-managed with previous Kenalog  injection. No current exacerbation.

## 2024-03-06 NOTE — Assessment & Plan Note (Signed)
 Sent for xray to confirm if growth is bone related If normal xray will have ultrasound performed Continue to monitor for increases in size Will expedite reading if mass continues to grow.

## 2024-03-06 NOTE — Assessment & Plan Note (Signed)
 Knee osteoarthritis Chronic knee osteoarthritis with worsening symptoms. Injections considered to manage symptoms and delay surgery. - Administer knee injection to manage symptoms and delay surgery. - Consider future injections based on symptom progression.

## 2024-03-06 NOTE — Assessment & Plan Note (Addendum)
 Knee pain with swelling post-replacement Chronic knee pain and swelling 3-4 years post-replacement, possibly involving nerve issues. Requires orthopedic evaluation and imaging. - Refer to orthopedic specialist for evaluation. - Order knee x-ray to assess structural integrity. - Consider nerve involvement if x-ray is unremarkable. - Contact previous surgical practice for follow-up facilitation.

## 2024-03-06 NOTE — Assessment & Plan Note (Signed)
 Controlled Denies any new or worsening symptoms Continue taking Lexapro  10mg  as prescribed Continue to monitor symptoms Will adjust treatment as needed depending on symptoms

## 2024-03-06 NOTE — Assessment & Plan Note (Signed)
 No major side effects reported with Wegovy  .25mg  Increase dose to .5mg  If no side effects can increase dose to 1mg  after 4 weeks of treatment Continue to monitor for any side effects.

## 2024-03-06 NOTE — Assessment & Plan Note (Signed)
 Controlled Continue taking protonix  40mg  as directed Continue to monitor symptoms Will adjust treatment depending on symptoms

## 2024-03-06 NOTE — Assessment & Plan Note (Signed)
 Allergic rhinitis well-controlled with montelukast  and Flonase . Memory concerns with montelukast . Transition to loratadine  considered. - Discontinue montelukast  due to memory concerns. - Start loratadine  (Claritin ) for allergy management. - Continue Flonase  for nasal symptoms. - Consider Z-Pak if sinus infection develops.

## 2024-03-06 NOTE — Assessment & Plan Note (Signed)
 LDL controlled at 80-90 with Crestor  5mg . -Consider increasing Crestor  dose if LDL remains high on today's labs. Lab Results  Component Value Date   LDLCALC 88 12/07/2023

## 2024-03-06 NOTE — Assessment & Plan Note (Signed)
 Well controlled.  Continue to work on eating a healthy diet and exercise.  Labs drawn today.   No major side effects reported, and no issues with compliance. The current medical regimen is effective;  continue present plan with Losartan  50mg , hydrochlorothiazide  25mg  Will adjust medication as needed depending on labs BP Readings from Last 3 Encounters:  03/06/24 111/68  12/07/23 130/78  09/02/23 132/82

## 2024-03-07 ENCOUNTER — Encounter: Payer: Self-pay | Admitting: Physician Assistant

## 2024-03-07 LAB — COMPREHENSIVE METABOLIC PANEL WITH GFR
ALT: 15 IU/L (ref 0–32)
AST: 20 IU/L (ref 0–40)
Albumin: 4.3 g/dL (ref 3.8–4.9)
Alkaline Phosphatase: 107 IU/L (ref 44–121)
BUN/Creatinine Ratio: 10 (ref 9–23)
BUN: 8 mg/dL (ref 6–24)
Bilirubin Total: 0.3 mg/dL (ref 0.0–1.2)
CO2: 25 mmol/L (ref 20–29)
Calcium: 9.3 mg/dL (ref 8.7–10.2)
Chloride: 100 mmol/L (ref 96–106)
Creatinine, Ser: 0.79 mg/dL (ref 0.57–1.00)
Globulin, Total: 2.2 g/dL (ref 1.5–4.5)
Glucose: 87 mg/dL (ref 70–99)
Potassium: 3.5 mmol/L (ref 3.5–5.2)
Sodium: 140 mmol/L (ref 134–144)
Total Protein: 6.5 g/dL (ref 6.0–8.5)
eGFR: 86 mL/min/{1.73_m2} (ref >59)

## 2024-03-07 LAB — CBC WITH DIFFERENTIAL/PLATELET
Basophils Absolute: 0 10*3/uL (ref 0.0–0.2)
Basos: 0 %
EOS (ABSOLUTE): 0.1 10*3/uL (ref 0.0–0.4)
Eos: 2 %
Hematocrit: 37.4 % (ref 34.0–46.6)
Hemoglobin: 12.7 g/dL (ref 11.1–15.9)
Immature Grans (Abs): 0 10*3/uL (ref 0.0–0.1)
Immature Granulocytes: 0 %
Lymphocytes Absolute: 1.9 10*3/uL (ref 0.7–3.1)
Lymphs: 27 %
MCH: 29.5 pg (ref 26.6–33.0)
MCHC: 34 g/dL (ref 31.5–35.7)
MCV: 87 fL (ref 79–97)
Monocytes Absolute: 0.5 10*3/uL (ref 0.1–0.9)
Monocytes: 8 %
Neutrophils Absolute: 4.3 10*3/uL (ref 1.4–7.0)
Neutrophils: 63 %
Platelets: 189 10*3/uL (ref 150–450)
RBC: 4.3 x10E6/uL (ref 3.77–5.28)
RDW: 14.2 % (ref 11.7–15.4)
WBC: 6.9 10*3/uL (ref 3.4–10.8)

## 2024-03-07 LAB — HEMOGLOBIN A1C
Est. average glucose Bld gHb Est-mCnc: 120 mg/dL
Hgb A1c MFr Bld: 5.8 % — ABNORMAL HIGH (ref 4.8–5.6)

## 2024-03-07 LAB — LIPID PANEL
Chol/HDL Ratio: 2.9 ratio (ref 0.0–4.4)
Cholesterol, Total: 160 mg/dL (ref 100–199)
HDL: 56 mg/dL (ref >39)
LDL Chol Calc (NIH): 88 mg/dL (ref 0–99)
Triglycerides: 87 mg/dL (ref 0–149)
VLDL Cholesterol Cal: 16 mg/dL (ref 5–40)

## 2024-03-14 ENCOUNTER — Encounter: Payer: Self-pay | Admitting: Physician Assistant

## 2024-03-15 ENCOUNTER — Ambulatory Visit: Payer: Self-pay | Admitting: Physician Assistant

## 2024-03-15 ENCOUNTER — Other Ambulatory Visit: Payer: Self-pay | Admitting: Physician Assistant

## 2024-03-15 DIAGNOSIS — M2570 Osteophyte, unspecified joint: Secondary | ICD-10-CM

## 2024-03-15 DIAGNOSIS — B9689 Other specified bacterial agents as the cause of diseases classified elsewhere: Secondary | ICD-10-CM

## 2024-03-15 MED ORDER — AZITHROMYCIN 250 MG PO TABS: ORAL_TABLET | ORAL | 0 refills | Status: DC

## 2024-03-19 ENCOUNTER — Ambulatory Visit (HOSPITAL_BASED_OUTPATIENT_CLINIC_OR_DEPARTMENT_OTHER)
Admission: RE | Admit: 2024-03-19 | Discharge: 2024-03-19 | Disposition: A | Discharge status: Home / Self Care | Source: Ambulatory Visit | Attending: Physician Assistant | Admitting: Physician Assistant

## 2024-03-19 DIAGNOSIS — M2570 Osteophyte, unspecified joint: Secondary | ICD-10-CM

## 2024-04-06 ENCOUNTER — Other Ambulatory Visit: Payer: Self-pay | Admitting: Obstetrics and Gynecology

## 2024-04-06 DIAGNOSIS — N3281 Overactive bladder: Secondary | ICD-10-CM

## 2024-04-10 ENCOUNTER — Encounter: Payer: Self-pay | Admitting: Physician Assistant

## 2024-04-11 ENCOUNTER — Ambulatory Visit: Payer: Self-pay | Admitting: Physician Assistant

## 2024-04-11 DIAGNOSIS — M898X8 Other specified disorders of bone, other site: Secondary | ICD-10-CM

## 2024-04-12 ENCOUNTER — Other Ambulatory Visit: Payer: Self-pay | Admitting: Physician Assistant

## 2024-04-12 DIAGNOSIS — I10 Essential (primary) hypertension: Secondary | ICD-10-CM

## 2024-04-17 ENCOUNTER — Other Ambulatory Visit: Payer: Self-pay

## 2024-04-17 DIAGNOSIS — N3281 Overactive bladder: Secondary | ICD-10-CM

## 2024-04-17 MED ORDER — TROSPIUM CHLORIDE ER 60 MG PO CP24: 1.0000 | ORAL_CAPSULE | Freq: Every day | ORAL | 0 refills | Status: AC

## 2024-04-19 ENCOUNTER — Encounter: Payer: Self-pay | Admitting: Physician Assistant

## 2024-05-09 ENCOUNTER — Encounter: Payer: Self-pay | Admitting: Physician Assistant

## 2024-05-09 ENCOUNTER — Other Ambulatory Visit: Payer: Self-pay | Admitting: Physician Assistant

## 2024-05-09 DIAGNOSIS — E66811 Obesity, class 1: Secondary | ICD-10-CM

## 2024-05-10 ENCOUNTER — Other Ambulatory Visit: Payer: Self-pay | Admitting: Physician Assistant

## 2024-05-10 MED ORDER — WEGOVY 1 MG/0.5ML ~~LOC~~ SOAJ: 1.0000 mg | SUBCUTANEOUS | 1 refills | Status: DC

## 2024-05-17 DIAGNOSIS — M25561 Pain in right knee: Secondary | ICD-10-CM | POA: Insufficient documentation

## 2024-06-05 ENCOUNTER — Institutional Professional Consult (permissible substitution): Admitting: Plastic Surgery

## 2024-06-06 NOTE — Progress Notes (Unsigned)
 Subjective:  Patient ID: Colleen Larsen, female    DOB: 1966-01-09  Age: 58 y.o. MRN: [id number]  No chief complaint on file.   HPI:     12/07/2023    8:12 AM 06/27/2023    3:40 PM 11/24/2022   10:01 AM 08/19/2022    8:29 AM 07/27/2022    8:24 AM  Depression screen PHQ 2/9  Decreased Interest 0 1 0 1 1  Down, Depressed, Hopeless 0 0 0 1 1  PHQ - 2 Score 0 1 0 2 2  Altered sleeping 1 2  1 1   Tired, decreased energy 1 2  1 1   Change in appetite 1 0  0 1  Feeling bad or failure about yourself  0 0  0 1  Trouble concentrating 1 0  0 1  Moving slowly or fidgety/restless 0 0  0 0  Suicidal thoughts 0 0  0 0  PHQ-9 Score 4 5  4 7   Difficult doing work/chores Somewhat difficult Somewhat difficult  Somewhat difficult Not difficult at all        12/07/2023    8:11 AM  Fall Risk   Falls in the past year? 0  Number falls in past yr: 0  Injury with Fall? 0  Risk for fall due to : No Fall Risks  Follow up Falls evaluation completed    Patient Care Team: Milon Cleaves, [place] as PCP - General (Physician Assistant)   Review of Systems  Constitutional:  Negative for appetite change, fatigue and fever.  HENT:  Negative for congestion, ear pain, sinus pressure and sore throat.   Respiratory:  Negative for cough, chest tightness, shortness of breath and wheezing.   Cardiovascular:  Negative for chest pain and palpitations.  Gastrointestinal:  Negative for abdominal pain, constipation, diarrhea, nausea and vomiting.  Genitourinary:  Negative for dysuria and hematuria.  Musculoskeletal:  Negative for arthralgias, back pain, joint swelling and myalgias.  Skin:  Negative for rash.  Neurological:  Negative for dizziness, weakness and headaches.  Psychiatric/Behavioral:  Negative for dysphoric mood. The patient is not nervous/anxious.     Current Outpatient Medications on File Prior to Visit  Medication Sig Dispense Refill   azithromycin  (ZITHROMAX ) 250 MG tablet 2 DAILY FOR FIRST DAY, THEN  DECREASE TO ONE DAILY FOR 4 MORE DAYS. 6 tablet 0   acetaminophen  (TYLENOL ) 500 MG tablet Take 1 tablet (500 mg total) by mouth every 6 (six) hours as needed (pain). 30 tablet 0   albuterol  (VENTOLIN  HFA) 108 (90 Base) MCG/ACT inhaler Inhale 2 puffs into the lungs every 6 (six) hours as needed for wheezing or shortness of breath. 8 g 2   azelastine  (ASTELIN ) 0.1 % nasal spray Place 1 spray into both nostrils 2 (two) times daily. Use in each nostril as directed 30 mL 12   cetirizine (ZYRTEC) 10 MG tablet Take 10 mg by mouth daily as needed for allergies.      cyanocobalamin  (VITAMIN B12) 1000 MCG/ML injection Inject 1 mL (1,000 mcg total) into the muscle once a week. 1 mL 3   escitalopram  (LEXAPRO ) 10 MG tablet Take 1 tablet (10 mg total) by mouth at bedtime. 90 tablet 1   estradiol  (ESTRACE ) 0.5 MG tablet TAKE 1 TABLET DAILY 90 tablet 2   fluticasone  (FLONASE ) 50 MCG/ACT nasal spray Place 2 sprays into both nostrils 2 (two) times daily as needed for allergies. 16 g 6   folic acid  (FOLVITE ) 1 MG tablet Take 1 tablet (1 mg  total) by mouth daily. 30 tablet 3   hydrochlorothiazide  (HYDRODIURIL ) 25 MG tablet Take 1 tablet (25 mg total) by mouth daily. 90 tablet 3   loratadine  (CLARITIN ) 10 MG tablet Take 1 tablet (10 mg total) by mouth daily. 30 tablet 11   losartan  (COZAAR ) 50 MG tablet TAKE 1 TABLET TWICE A DAY 180 tablet 1   Melatonin 10 MG TABS Take 10 mg by mouth at bedtime as needed (for sleep).     meloxicam  (MOBIC ) 15 MG tablet TAKE 1 TABLET(15 MG) BY MOUTH DAILY 30 tablet 3   montelukast  (SINGULAIR ) 10 MG tablet TAKE 1 TABLET AT BEDTIME 90 tablet 3   ondansetron  (ZOFRAN ) 4 MG tablet Take 1 tablet (4 mg total) by mouth every 8 (eight) hours as needed for nausea or vomiting. 20 tablet 0   pantoprazole  (PROTONIX ) 40 MG tablet TAKE 1 TABLET DAILY 90 tablet 1   phentermine  37.5 MG capsule TAKE 1 CAPSULE(37.5 MG) BY MOUTH EVERY MORNING AS DIRECTED (Patient not taking: Reported on 03/06/2024) 30 capsule  0   rosuvastatin  (CRESTOR ) 5 MG tablet Take 1 tablet (5 mg total) by mouth at bedtime. 90 tablet 3   Semaglutide -Weight Management (WEGOVY ) 1 MG/0.5ML SOAJ Inject 1 mg into the skin once a week. 2 mL 1   Trospium  Chloride 60 MG CP24 Take 1 capsule (60 mg total) by mouth daily at 8 pm. 90 capsule 0   Vitamin D , Ergocalciferol , (DRISDOL ) 1.25 MG (50000 UNIT) CAPS capsule Take 1 capsule (50,000 Units total) by mouth every 7 (seven) days. (Patient taking differently: Take 50,000 Units by mouth every 7 (seven) days. Saturday's) 12 capsule 1   No current facility-administered medications on file prior to visit.   Past Medical History:  Diagnosis Date   Allergic rhinitis, seasonal    COPD (chronic obstructive pulmonary disease) (HCC)    GAD (generalized anxiety disorder)    GERD (gastroesophageal reflux disease)    History of adenomatous polyp of colon    Hyperlipidemia, mixed    Hypertension    MDD (major depressive disorder)    Mixed stress and urge urinary incontinence    OA (osteoarthritis)    OAB (overactive bladder)    Tobacco user 12/07/2023   Wears glasses    Past Surgical History:  Procedure Laterality Date   ANTERIOR CERVICAL DECOMP/DISCECTOMY FUSION  02/14/2007   @[facility]  by dr pool   BILATERAL CARPAL TUNNLE RELEASE Bilateral    1990s   BLADDER SUSPENSION N/A 02/14/2023   Procedure: TRANSVAGINAL TAPE (TVT) PROCEDURE;  Surgeon: Marilynne Rosaline SAILOR, MD;  Location: [facility];  Service: Gynecology;  Laterality: N/A;  Total time requested is 1 hour   CERVICAL SPINE SURGERY  04/28/2010   @[facility]  by dr pool;   RE-EXPLORATION C5-6 FUSION / REMOVAL HARDWARE AND ACDF C6-7   CHOLECYSTECTOMY OPEN  1981   CYSTOSCOPY N/A 02/14/2023   Procedure: CYSTOSCOPY;  Surgeon: Marilynne Rosaline SAILOR, MD;  Location[facility];  Service: Gynecology;  Laterality: N/A;   DEBRIDEMENT TENNIS ELBOW Right    1990s   KNEE CLOSED REDUCTION Right 08/02/2018   Procedure: CLOSED  MANIPULATION RIGHT TOTAL KNEE;  Surgeon: Heide Ingle, MD;  Location: [facility];  Service: Orthopedics;  Laterality: Right;    LAPAROSCOPIC ASSISTED VAGINAL HYSTERECTOMY  2003   TONSILLECTOMY     child   TOTAL KNEE ARTHROPLASTY Right 05/17/2018   Procedure: RIGHT TOTAL KNEE ARTHROPLASTY, INSERTION OF BONE GRAFT IN FEMORAL CANAL;  Surgeon: Heide Ingle, MD;  Location: [facility];  Service: Orthopedics;  Laterality: Right;   TUBAL LIGATION Bilateral    yrs ago    Family History  Problem Relation Age of Onset   Cancer Mother    Hypertension Mother    Hyperlipidemia Mother    Rectal cancer Mother    Hypertension Father    Hyperlipidemia Father    Heart Problems Father    Arthritis Father    COPD Father    Hypertension Brother    Hypertension Paternal Aunt    Heart Problems Paternal Aunt    Hypertension Paternal Aunt    Hyperlipidemia Paternal Aunt    Hypertension Paternal Aunt    Hyperlipidemia Paternal Aunt    Skin cancer Paternal Aunt    Heart Problems Paternal Aunt    Breast cancer Maternal Grandmother    Hypertension Paternal Grandmother    Heart Problems Paternal Grandmother    Hypertension Paternal Grandfather    Stroke Paternal Grandfather    Hyperlipidemia Paternal Grandfather    Colon cancer Neg Hx    Colon polyps Neg Hx    Esophageal cancer Neg Hx    Stomach cancer Neg Hx    Social History   Socioeconomic History   Marital status: Married    Spouse name: Not on file   Number of children: Not on file   Years of education: Not on file   Highest education level: Not on file  Occupational History   Not on file  Tobacco Use   Smoking status: Former    Current packs/day: 0.00    Average packs/day: 1 pack/day for 30.0 years (30.0 ttl pk-yrs)    Types: Cigarettes    Quit date: 06/25/2023    Years since quitting: 0.9   Smokeless tobacco: Never  Vaping Use   Vaping status: Some Days   Substances: Nicotine, Flavoring   Devices: buse  Substance and Sexual  Activity   Alcohol use: Yes    Comment: RARE   Drug use: Never   Sexual activity: Yes    Partners: Male    Birth control/protection: Surgical  Other Topics Concern   Not on file  Social History Narrative   Not on file   Social Drivers of Health   Financial Resource Strain: Low Risk  (07/27/2022)   Overall Financial Resource Strain (CARDIA)    Difficulty of Paying Living Expenses: Not hard at all  Food Insecurity: No Food Insecurity (07/27/2022)   Hunger Vital Sign    Worried About Running Out of Food in the Last Year: Never true    Ran Out of Food in the Last Year: Never true  Transportation Needs: No Transportation Needs (07/27/2022)   PRAPARE - Administrator, Civil Service (Medical): No    Lack of Transportation (Non-Medical): No  Physical Activity: Inactive (07/27/2022)   Exercise Vital Sign    Days of Exercise per Week: 0 days    Minutes of Exercise per Session: 0 min  Stress: No Stress Concern Present (07/27/2022)   [employer] of Occupational Health - Occupational Stress Questionnaire    Feeling of Stress : Not at all  Social Connections: Moderately Integrated (07/27/2022)   Social Connection and Isolation Panel    Frequency of Communication with Friends and Family: More than three times a week    Frequency of Social Gatherings with Friends and Family: More than three times a week    Attends Religious Services: 1 to 4 times per year    Active Member of [employer]  or Organizations: No    Attends Banker Meetings: Never    Marital Status: Married    Objective:  There were no vitals taken for this visit.     03/06/2024    7:49 AM 12/07/2023    8:00 AM 09/02/2023    9:50 AM  BP/Weight  Systolic BP 111 130 132  Diastolic BP 68 78 82  Wt. (Lbs) 195 195.4 186  BMI 32.45 kg/m2 32.52 kg/m2 30.95 kg/m2    Physical Exam    Lab Results  Component Value Date   WBC 6.9 03/06/2024   HGB 12.7 03/06/2024   HCT 37.4 03/06/2024   PLT 189 03/06/2024    GLUCOSE 87 03/06/2024   CHOL 160 03/06/2024   TRIG 87 03/06/2024   HDL 56 03/06/2024   LDLCALC 88 03/06/2024   ALT 15 03/06/2024   AST 20 03/06/2024   NA 140 03/06/2024   K 3.5 03/06/2024   CL 100 03/06/2024   CREATININE 0.79 03/06/2024   BUN 8 03/06/2024   CO2 25 03/06/2024   TSH 1.850 12/07/2023   INR 0.87 05/10/2018   HGBA1C 5.8 (H) 03/06/2024      Assessment & Plan:  There are no diagnoses linked to this encounter.   No orders of the defined types were placed in this encounter.   No orders of the defined types were placed in this encounter.    Follow-up: No follow-ups on file.   I,Lauren M Auman,acting as a Neurosurgeon for [employer], PA.,have documented all relevant documentation on the behalf of Nola Angles, PA,as directed by  Nola Angles, PA while in the presence of Nola Angles, [place].   An After Visit Summary was printed and given to the patient.  Nola Angles, [place] [facility] [phone]

## 2024-06-07 ENCOUNTER — Ambulatory Visit: Admitting: Physician Assistant

## 2024-06-07 ENCOUNTER — Encounter: Payer: Self-pay | Admitting: Physician Assistant

## 2024-06-07 VITALS — BP 122/80 | HR 76 | Temp 98.2°F | Ht 65.0 in | Wt 189.4 lb

## 2024-06-07 DIAGNOSIS — R7303 Prediabetes: Secondary | ICD-10-CM | POA: Insufficient documentation

## 2024-06-07 DIAGNOSIS — F17219 Nicotine dependence, cigarettes, with unspecified nicotine-induced disorders: Secondary | ICD-10-CM

## 2024-06-07 DIAGNOSIS — J301 Allergic rhinitis due to pollen: Secondary | ICD-10-CM

## 2024-06-07 DIAGNOSIS — G8929 Other chronic pain: Secondary | ICD-10-CM

## 2024-06-07 DIAGNOSIS — I10 Essential (primary) hypertension: Secondary | ICD-10-CM | POA: Diagnosis not present

## 2024-06-07 DIAGNOSIS — J0111 Acute recurrent frontal sinusitis: Secondary | ICD-10-CM | POA: Diagnosis not present

## 2024-06-07 DIAGNOSIS — Z6832 Body mass index (BMI) 32.0-32.9, adult: Secondary | ICD-10-CM

## 2024-06-07 DIAGNOSIS — E559 Vitamin D deficiency, unspecified: Secondary | ICD-10-CM

## 2024-06-07 DIAGNOSIS — Z6831 Body mass index (BMI) 31.0-31.9, adult: Secondary | ICD-10-CM | POA: Insufficient documentation

## 2024-06-07 DIAGNOSIS — M65311 Trigger thumb, right thumb: Secondary | ICD-10-CM | POA: Insufficient documentation

## 2024-06-07 DIAGNOSIS — F411 Generalized anxiety disorder: Secondary | ICD-10-CM

## 2024-06-07 DIAGNOSIS — K219 Gastro-esophageal reflux disease without esophagitis: Secondary | ICD-10-CM

## 2024-06-07 DIAGNOSIS — Z139 Encounter for screening, unspecified: Secondary | ICD-10-CM | POA: Insufficient documentation

## 2024-06-07 DIAGNOSIS — Z720 Tobacco use: Secondary | ICD-10-CM | POA: Insufficient documentation

## 2024-06-07 DIAGNOSIS — E782 Mixed hyperlipidemia: Secondary | ICD-10-CM | POA: Diagnosis not present

## 2024-06-07 DIAGNOSIS — M25561 Pain in right knee: Secondary | ICD-10-CM

## 2024-06-07 DIAGNOSIS — M5431 Sciatica, right side: Secondary | ICD-10-CM

## 2024-06-07 DIAGNOSIS — M25562 Pain in left knee: Secondary | ICD-10-CM

## 2024-06-07 MED ORDER — AZITHROMYCIN 250 MG PO TABS: ORAL_TABLET | ORAL | 0 refills | Status: AC

## 2024-06-07 MED ORDER — DICLOFENAC SODIUM 50 MG PO TBEC: 50.0000 mg | DELAYED_RELEASE_TABLET | Freq: Every day | ORAL | 0 refills | Status: DC

## 2024-06-07 MED ORDER — PREDNISONE 20 MG PO TABS: ORAL_TABLET | ORAL | 0 refills | Status: AC

## 2024-06-07 NOTE — Assessment & Plan Note (Signed)
 Depression and anxiety managed with escitalopram  10 mg without reported issues.

## 2024-06-07 NOTE — Assessment & Plan Note (Signed)
 Chronic left knee pain due to osteoarthritis with significant gastrointestinal side effects from previous high-dose diclofenac . - Prescribed diclofenac  50 mg once daily for 30 days. - Consider every other day dosing if side effects persist. - Monitor for gastrointestinal side effects. - Plan follow-up in three months to assess knee pain and consider injection therapy.

## 2024-06-07 NOTE — Assessment & Plan Note (Signed)
 Refractory gastroesophageal reflux disease with persistent symptoms despite pantoprazole . No endoscopy or H. pylori testing history. Considering Voquezna for its longer duration of action. - Discontinued pantoprazole . - Provided samples of Voquezna for trial. - Monitor response to Voquezna. - Consider referral for endoscopy if symptoms persist.

## 2024-06-07 NOTE — Assessment & Plan Note (Signed)
 Controlled Continue to monitor diet and exercise Labs drawn today Will adjust treatment based on results Lab Results  Component Value Date   HGBA1C 5.8 (H) 03/06/2024   HGBA1C 6.2 (H) 09/01/2023

## 2024-06-07 NOTE — Assessment & Plan Note (Signed)
 Acute exacerbation of chronic sinusitis with sinus drainage, throat pain, and green sputum. Possible sinus infection indicated by pus behind the right ear. - Prescribed azithromycin  (Z-Pak). - Prescribed prednisone  to reduce inflammation and open sinuses. - Monitor response to treatment and adjust if necessary.

## 2024-06-07 NOTE — Assessment & Plan Note (Signed)
 Almost 1 year smoke free on August 14th Congratulations! Keep up the good work.

## 2024-06-07 NOTE — Assessment & Plan Note (Signed)
 Well controlled.  Continue to work on eating a healthy diet and exercise.  Labs drawn today.   No major side effects reported, and no issues with compliance. The current medical regimen is effective;  continue present plan with Losartan  50mg , hydrochlorothiazide  25mg  Will adjust medication as needed depending on labs BP Readings from Last 3 Encounters:  06/07/24 122/80  03/06/24 111/68  12/07/23 130/78

## 2024-06-07 NOTE — Assessment & Plan Note (Signed)
 Hyperlipidemia managed with medication. - Order blood test to evaluate cholesterol levels.

## 2024-06-07 NOTE — Patient Instructions (Signed)
 VISIT SUMMARY:  During your visit, we discussed your ongoing knee pain, sinus symptoms, acid reflux, and other health concerns. We reviewed your current medications and made some adjustments to better manage your symptoms. We also celebrated your one-year milestone of not smoking, which is a significant achievement.  YOUR PLAN:  -CHRONIC LEFT KNEE PAIN DUE TO OSTEOARTHRITIS: Osteoarthritis is a condition where the protective cartilage that cushions the ends of your bones wears down over time. We have prescribed diclofenac  50 mg once daily for 30 days to help manage your knee pain. If you continue to experience side effects, you may take it every other day. We will monitor for any gastrointestinal side effects and follow up in three months to assess your knee pain and consider injection therapy.  -CHRONIC SINUSITIS WITH ACUTE EXACERBATION: Chronic sinusitis is a condition where the cavities around your nasal passages become inflamed and swollen for an extended period. You are experiencing an acute exacerbation, which means your symptoms have suddenly worsened. We have prescribed azithromycin  (Z-Pak) and prednisone  to reduce inflammation and open your sinuses. Please monitor your response to the treatment and let us  know if there are any issues.  -REFRACTORY GASTROESOPHAGEAL REFLUX DISEASE: Gastroesophageal reflux disease (GERD) is a chronic condition where stomach acid frequently flows back into the tube connecting your mouth and stomach. We have discontinued pantoprazole  and provided samples of Voquezna for you to try, as it has a longer duration of action. We will monitor your response to Voquezna and consider a referral for an endoscopy if your symptoms persist.  -DEPRESSION AND ANXIETY: Depression and anxiety are mental health conditions that affect your mood and overall well-being. You are currently managing these conditions with escitalopram  10 mg daily, and there are no reported issues at this  time.  -SCALP MASS, PENDING SURGICAL EXCISION: You have a scalp mass that is pending surgical excision. There has been no change in size or pain. You have a scheduled appointment with a plastic surgeon on September 9th for a better cosmetic outcome.  -EASY BRUISING/ECCHYMOSIS, UNDER EVALUATION: Easy bruising or ecchymosis can be caused by various factors, including low platelets or anemia. We have ordered a blood count to evaluate for these conditions. We also discussed the interaction between diclofenac  and escitalopram , which can increase the risk of bleeding, though the risk is lower with your current dosages. Please monitor for any changes in bruising.  -HYPERLIPIDEMIA: Hyperlipidemia is a condition where there are high levels of fats (lipids) in your blood. We have ordered a blood test to evaluate your cholesterol levels.  -TOBACCO USE, IN REMISSION: You have successfully quit smoking for one year, which is a significant milestone for your health. Congratulations on this achievement!  INSTRUCTIONS:  Please follow up in three months to assess your knee pain and consider injection therapy. Monitor your response to the new medications and report any side effects. Attend your scheduled appointment with the plastic surgeon on September 9th for the scalp mass excision. We will also follow up on your blood test results to evaluate for anemia, low platelets, and cholesterol levels.

## 2024-06-08 LAB — CBC WITH DIFFERENTIAL/PLATELET
Basophils Absolute: 0 x10E3/uL (ref 0.0–0.2)
Basos: 0 %
EOS (ABSOLUTE): 0.2 x10E3/uL (ref 0.0–0.4)
Eos: 3 %
Hematocrit: 38 % (ref 34.0–46.6)
Hemoglobin: 12.4 g/dL (ref 11.1–15.9)
Immature Grans (Abs): 0 x10E3/uL (ref 0.0–0.1)
Immature Granulocytes: 0 %
Lymphocytes Absolute: 1.5 x10E3/uL (ref 0.7–3.1)
Lymphs: 22 %
MCH: 29.8 pg (ref 26.6–33.0)
MCHC: 32.6 g/dL (ref 31.5–35.7)
MCV: 91 fL (ref 79–97)
Monocytes Absolute: 0.5 x10E3/uL (ref 0.1–0.9)
Monocytes: 7 %
Neutrophils Absolute: 4.6 x10E3/uL (ref 1.4–7.0)
Neutrophils: 68 %
Platelets: 203 x10E3/uL (ref 150–450)
RBC: 4.16 x10E6/uL (ref 3.77–5.28)
RDW: 14.6 % (ref 11.7–15.4)
WBC: 6.8 x10E3/uL (ref 3.4–10.8)

## 2024-06-08 LAB — COMPREHENSIVE METABOLIC PANEL WITH GFR
ALT: 17 IU/L (ref 0–32)
AST: 23 IU/L (ref 0–40)
Albumin: 4.2 g/dL (ref 3.8–4.9)
Alkaline Phosphatase: 93 IU/L (ref 44–121)
BUN/Creatinine Ratio: 20 (ref 9–23)
BUN: 14 mg/dL (ref 6–24)
Bilirubin Total: 0.5 mg/dL (ref 0.0–1.2)
CO2: 24 mmol/L (ref 20–29)
Calcium: 9.3 mg/dL (ref 8.7–10.2)
Chloride: 99 mmol/L (ref 96–106)
Creatinine, Ser: 0.69 mg/dL (ref 0.57–1.00)
Globulin, Total: 2 g/dL (ref 1.5–4.5)
Glucose: 79 mg/dL (ref 70–99)
Potassium: 2.8 mmol/L — ABNORMAL LOW (ref 3.5–5.2)
Sodium: 142 mmol/L (ref 134–144)
Total Protein: 6.2 g/dL (ref 6.0–8.5)
eGFR: 100 mL/min/1.73 (ref >59)

## 2024-06-08 LAB — HEMOGLOBIN A1C
Est. average glucose Bld gHb Est-mCnc: 103 mg/dL
Hgb A1c MFr Bld: 5.2 % (ref 4.8–5.6)

## 2024-06-08 LAB — LIPID PANEL
Chol/HDL Ratio: 2.9 ratio (ref 0.0–4.4)
Cholesterol, Total: 149 mg/dL (ref 100–199)
HDL: 51 mg/dL (ref >39)
LDL Chol Calc (NIH): 80 mg/dL (ref 0–99)
Triglycerides: 96 mg/dL (ref 0–149)
VLDL Cholesterol Cal: 18 mg/dL (ref 5–40)

## 2024-06-11 ENCOUNTER — Ambulatory Visit: Payer: Self-pay | Admitting: Physician Assistant

## 2024-06-11 DIAGNOSIS — I10 Essential (primary) hypertension: Secondary | ICD-10-CM

## 2024-06-11 MED ORDER — POTASSIUM CHLORIDE CRYS ER 10 MEQ PO TBCR: 10.0000 meq | EXTENDED_RELEASE_TABLET | Freq: Two times a day (BID) | ORAL | 1 refills | Status: DC

## 2024-06-11 NOTE — Telephone Encounter (Signed)
 Patient informed and understood verbally and was scheduled to come back to recheck CMP on this Friday!   Placed order for CMP to be released.

## 2024-06-15 ENCOUNTER — Encounter: Payer: Self-pay | Admitting: Physician Assistant

## 2024-06-15 ENCOUNTER — Other Ambulatory Visit

## 2024-06-15 DIAGNOSIS — I10 Essential (primary) hypertension: Secondary | ICD-10-CM

## 2024-06-16 LAB — COMPREHENSIVE METABOLIC PANEL WITH GFR
ALT: 18 IU/L (ref 0–32)
AST: 16 IU/L (ref 0–40)
Albumin: 4 g/dL (ref 3.8–4.9)
Alkaline Phosphatase: 83 IU/L (ref 44–121)
BUN/Creatinine Ratio: 22 (ref 9–23)
BUN: 17 mg/dL (ref 6–24)
Bilirubin Total: 0.3 mg/dL (ref 0.0–1.2)
CO2: 24 mmol/L (ref 20–29)
Calcium: 9.4 mg/dL (ref 8.7–10.2)
Chloride: 98 mmol/L (ref 96–106)
Creatinine, Ser: 0.76 mg/dL (ref 0.57–1.00)
Globulin, Total: 2.1 g/dL (ref 1.5–4.5)
Glucose: 89 mg/dL (ref 70–99)
Potassium: 2.8 mmol/L — ABNORMAL LOW (ref 3.5–5.2)
Sodium: 140 mmol/L (ref 134–144)
Total Protein: 6.1 g/dL (ref 6.0–8.5)
eGFR: 90 mL/min/1.73 (ref >59)

## 2024-06-19 ENCOUNTER — Other Ambulatory Visit: Payer: Self-pay | Admitting: Physician Assistant

## 2024-06-19 ENCOUNTER — Ambulatory Visit: Payer: Self-pay | Admitting: Physician Assistant

## 2024-06-19 DIAGNOSIS — K219 Gastro-esophageal reflux disease without esophagitis: Secondary | ICD-10-CM

## 2024-06-19 DIAGNOSIS — F411 Generalized anxiety disorder: Secondary | ICD-10-CM

## 2024-06-19 DIAGNOSIS — I10 Essential (primary) hypertension: Secondary | ICD-10-CM

## 2024-06-19 MED ORDER — VOQUEZNA 10 MG PO TABS: 10.0000 mg | ORAL_TABLET | Freq: Every day | ORAL | 3 refills | Status: DC

## 2024-06-20 ENCOUNTER — Other Ambulatory Visit: Payer: Self-pay | Admitting: Physician Assistant

## 2024-06-20 DIAGNOSIS — K219 Gastro-esophageal reflux disease without esophagitis: Secondary | ICD-10-CM

## 2024-06-20 MED ORDER — ESOMEPRAZOLE MAGNESIUM 40 MG PO CPDR: 40.0000 mg | DELAYED_RELEASE_CAPSULE | Freq: Every day | ORAL | 3 refills | Status: DC

## 2024-06-21 ENCOUNTER — Other Ambulatory Visit: Payer: Self-pay | Admitting: Physician Assistant

## 2024-06-21 DIAGNOSIS — E876 Hypokalemia: Secondary | ICD-10-CM

## 2024-06-21 DIAGNOSIS — I10 Essential (primary) hypertension: Secondary | ICD-10-CM

## 2024-06-21 MED ORDER — POTASSIUM CHLORIDE CRYS ER 20 MEQ PO TBCR: 20.0000 meq | EXTENDED_RELEASE_TABLET | Freq: Two times a day (BID) | ORAL | 1 refills | Status: DC

## 2024-07-03 ENCOUNTER — Other Ambulatory Visit: Payer: Self-pay | Admitting: Physician Assistant

## 2024-07-03 DIAGNOSIS — G8929 Other chronic pain: Secondary | ICD-10-CM

## 2024-07-04 ENCOUNTER — Other Ambulatory Visit: Payer: Self-pay | Admitting: Physician Assistant

## 2024-07-04 DIAGNOSIS — E7849 Other hyperlipidemia: Secondary | ICD-10-CM

## 2024-07-04 DIAGNOSIS — K219 Gastro-esophageal reflux disease without esophagitis: Secondary | ICD-10-CM

## 2024-07-04 DIAGNOSIS — I10 Essential (primary) hypertension: Secondary | ICD-10-CM

## 2024-07-05 ENCOUNTER — Other Ambulatory Visit

## 2024-07-06 ENCOUNTER — Other Ambulatory Visit: Payer: Self-pay | Admitting: Physician Assistant

## 2024-07-06 LAB — COMPREHENSIVE METABOLIC PANEL WITH GFR
ALT: 18 IU/L (ref 0–32)
AST: 19 IU/L (ref 0–40)
Albumin: 4.1 g/dL (ref 3.8–4.9)
Alkaline Phosphatase: 90 IU/L (ref 44–121)
BUN/Creatinine Ratio: 21 (ref 12–28)
BUN: 15 mg/dL (ref 8–27)
Bilirubin Total: 0.4 mg/dL (ref 0.0–1.2)
CO2: 24 mmol/L (ref 20–29)
Calcium: 9.2 mg/dL (ref 8.7–10.3)
Chloride: 99 mmol/L (ref 96–106)
Creatinine, Ser: 0.7 mg/dL (ref 0.57–1.00)
Globulin, Total: 2.2 g/dL (ref 1.5–4.5)
Glucose: 88 mg/dL (ref 70–99)
Potassium: 3.4 mmol/L — ABNORMAL LOW (ref 3.5–5.2)
Sodium: 138 mmol/L (ref 134–144)
Total Protein: 6.3 g/dL (ref 6.0–8.5)
eGFR: 99 mL/min/1.73 (ref >59)

## 2024-07-09 ENCOUNTER — Encounter: Payer: Self-pay | Admitting: Physician Assistant

## 2024-07-09 ENCOUNTER — Ambulatory Visit: Admitting: Physician Assistant

## 2024-07-09 VITALS — BP 122/82 | HR 84 | Temp 97.5°F | Ht 65.0 in | Wt 190.0 lb

## 2024-07-09 DIAGNOSIS — L659 Nonscarring hair loss, unspecified: Secondary | ICD-10-CM

## 2024-07-09 DIAGNOSIS — E669 Obesity, unspecified: Secondary | ICD-10-CM

## 2024-07-09 DIAGNOSIS — I10 Essential (primary) hypertension: Secondary | ICD-10-CM

## 2024-07-09 DIAGNOSIS — M25561 Pain in right knee: Secondary | ICD-10-CM

## 2024-07-09 DIAGNOSIS — M5441 Lumbago with sciatica, right side: Secondary | ICD-10-CM

## 2024-07-09 DIAGNOSIS — K219 Gastro-esophageal reflux disease without esophagitis: Secondary | ICD-10-CM

## 2024-07-09 DIAGNOSIS — E876 Hypokalemia: Secondary | ICD-10-CM

## 2024-07-09 MED ORDER — WEGOVY 1.7 MG/0.75ML ~~LOC~~ SOAJ: 1.7000 mg | SUBCUTANEOUS | 1 refills | Status: DC

## 2024-07-09 MED ORDER — TRIAMCINOLONE ACETONIDE 40 MG/ML IJ SUSP
80.0000 mg | Freq: Once | INTRAMUSCULAR | Status: AC
  Administered 2024-07-09: 80 mg via INTRAMUSCULAR

## 2024-07-09 MED ORDER — PREDNISONE 20 MG PO TABS: ORAL_TABLET | ORAL | 0 refills | Status: AC

## 2024-07-09 NOTE — Progress Notes (Signed)
 Acute Office Visit  Subjective:    Patient ID: Colleen Larsen, female    DOB: 06-12-1966, 58 y.o.   MRN: [id number]  Chief Complaint  Patient presents with   Sciatic nerve pain    HPI: Patient is in today for sciatica pain  Discussed the use of AI scribe software for clinical note transcription with the patient, who gave verbal consent to proceed.  History of Present Illness Colleen Larsen is a 58 year old female who presents for a possible steroid injection for sciatica pain.  She experiences consistent sciatica pain, particularly during her twelve-hour work shifts. She is concerned about managing this pain during an upcoming trip to [place]. She last received a steroid injection in the spring and uses a Tommy Copper knee sleeve for some relief. She takes diclofenac  (Voltaren ) once daily for joint pain without gastrointestinal side effects.  She has been experiencing hair loss for about a month, noticing a bald spot and significant shedding. Her hairdresser suggested collagen supplements.  She is currently on Wegovy  at a dose of one milligram but reports no weight loss despite efforts. No nausea or diarrhea. Her potassium levels have improved to 3.4 from 2.8, and she continues to take potassium supplements. She is considering adjusting her diuretic use to better manage potassium levels.  No sinus congestion or ear pain, although she has been blowing her nose due to seasonal changes. No stomach issues or upset stomach with her current medications.  Her blood pressure was recorded at 122/82. She is currently taking losartan  for blood pressure management and Nexium  for reflux, which she reports is working well. She reports feeling dizzy starting this week.    Past Medical History:  Diagnosis Date   Allergic rhinitis, seasonal    COPD (chronic obstructive pulmonary disease) (HCC)    GAD (generalized anxiety disorder)    GERD (gastroesophageal reflux disease)    History of  adenomatous polyp of colon    Hyperlipidemia, mixed    Hypertension    MDD (major depressive disorder)    Mixed stress and urge urinary incontinence    OA (osteoarthritis)    OAB (overactive bladder)    Tobacco user 12/07/2023   Wears glasses     Past Surgical History:  Procedure Laterality Date   ANTERIOR CERVICAL DECOMP/DISCECTOMY FUSION  02/14/2007   @[facility]  by dr pool   BILATERAL CARPAL TUNNLE RELEASE Bilateral    1990s   BLADDER SUSPENSION N/A 02/14/2023   Procedure: TRANSVAGINAL TAPE (TVT) PROCEDURE;  Surgeon: Marilynne Rosaline SAILOR, MD;  Location: [facility];  Service: Gynecology;  Laterality: N/A;  Total time requested is 1 hour   CERVICAL SPINE SURGERY  04/28/2010   @[facility]  by dr pool;   RE-EXPLORATION C5-6 FUSION / REMOVAL HARDWARE AND ACDF C6-7   CHOLECYSTECTOMY OPEN  1981   CYSTOSCOPY N/A 02/14/2023   Procedure: CYSTOSCOPY;  Surgeon: Marilynne Rosaline SAILOR, MD;  Location: [facility];  Service: Gynecology;  Laterality: N/A;   DEBRIDEMENT TENNIS ELBOW Right    1990s   KNEE CLOSED REDUCTION Right 08/02/2018   Procedure: CLOSED MANIPULATION RIGHT TOTAL KNEE;  Surgeon: Heide Ingle, MD;  Location: [facility];  Service: Orthopedics;  Laterality: Right;    LAPAROSCOPIC ASSISTED VAGINAL HYSTERECTOMY  2003   TONSILLECTOMY     child   TOTAL KNEE ARTHROPLASTY Right 05/17/2018   Procedure: RIGHT TOTAL KNEE ARTHROPLASTY, INSERTION OF BONE GRAFT IN FEMORAL CANAL;  Surgeon: Heide Ingle, MD;  Location: [facility];  Service: Orthopedics;  Laterality: Right;   TUBAL LIGATION Bilateral    yrs ago    Family History  Problem Relation Age of Onset   Cancer Mother    Hypertension Mother    Hyperlipidemia Mother    Rectal cancer Mother    Hypertension Father    Hyperlipidemia Father    Heart Problems Father    Arthritis Father    COPD Father    Hypertension Brother    Hypertension Paternal Aunt    Heart Problems Paternal Aunt    Hypertension Paternal  Aunt    Hyperlipidemia Paternal Aunt    Hypertension Paternal Aunt    Hyperlipidemia Paternal Aunt    Skin cancer Paternal Aunt    Heart Problems Paternal Aunt    Breast cancer Maternal Grandmother    Hypertension Paternal Grandmother    Heart Problems Paternal Grandmother    Hypertension Paternal Grandfather    Stroke Paternal Grandfather    Hyperlipidemia Paternal Grandfather    Colon cancer Neg Hx    Colon polyps Neg Hx    Esophageal cancer Neg Hx    Stomach cancer Neg Hx     Social History   Socioeconomic History   Marital status: Married    Spouse name: Not on file   Number of children: Not on file   Years of education: Not on file   Highest education level: Not on file  Occupational History   Not on file  Tobacco Use   Smoking status: Former    Current packs/day: 0.00    Average packs/day: 1 pack/day for 30.0 years (30.0 ttl pk-yrs)    Types: Cigarettes    Quit date: 06/25/2023    Years since quitting: 1.0   Smokeless tobacco: Never  Vaping Use   Vaping status: Some Days   Substances: Nicotine, Flavoring   Devices: buse  Substance and Sexual Activity   Alcohol use: Yes    Comment: RARE   Drug use: Never   Sexual activity: Yes    Partners: Male    Birth control/protection: Surgical  Other Topics Concern   Not on file  Social History Narrative   Not on file   Social Drivers of Health   Financial Resource Strain: Low Risk  (07/27/2022)   Overall Financial Resource Strain (CARDIA)    Difficulty of Paying Living Expenses: Not hard at all  Food Insecurity: No Food Insecurity (07/27/2022)   Hunger Vital Sign    Worried About Running Out of Food in the Last Year: Never true    Ran Out of Food in the Last Year: Never true  Transportation Needs: No Transportation Needs (07/27/2022)   PRAPARE - Administrator, Civil Service (Medical): No    Lack of Transportation (Non-Medical): No  Physical Activity: Inactive (07/27/2022)   Exercise Vital Sign     Days of Exercise per Week: 0 days    Minutes of Exercise per Session: 0 min  Stress: No Stress Concern Present (07/27/2022)   [employer] of Occupational Health - Occupational Stress Questionnaire    Feeling of Stress : Not at all  Social Connections: Moderately Integrated (07/27/2022)   Social Connection and Isolation Panel    Frequency of Communication with Friends and Family: More than three times a week    Frequency of Social Gatherings with Friends and Family: More than three times a week    Attends Religious Services: 1 to 4 times per year    Active Member of [employer] or Organizations:  No    Attends Club or Organization Meetings: Never    Marital Status: Married  Catering manager Violence: Not At Risk (07/27/2022)   Humiliation, Afraid, Rape, and Kick questionnaire    Fear of Current or Ex-Partner: No    Emotionally Abused: No    Physically Abused: No    Sexually Abused: No    Outpatient Medications Prior to Visit  Medication Sig Dispense Refill   acetaminophen  (TYLENOL ) 500 MG tablet Take 1 tablet (500 mg total) by mouth every 6 (six) hours as needed (pain). (Patient not taking: Reported on 07/10/2024) 30 tablet 0   albuterol  (VENTOLIN  HFA) 108 (90 Base) MCG/ACT inhaler Inhale 2 puffs into the lungs every 6 (six) hours as needed for wheezing or shortness of breath. 8 g 2   cetirizine (ZYRTEC) 10 MG tablet Take 10 mg by mouth daily as needed for allergies.      diclofenac  (VOLTAREN ) 50 MG EC tablet TAKE 1 TABLET(50 MG) BY MOUTH DAILY 30 tablet 3   escitalopram  (LEXAPRO ) 10 MG tablet TAKE 1 TABLET AT BEDTIME 90 tablet 3   esomeprazole  (NEXIUM ) 40 MG capsule Take 1 capsule (40 mg total) by mouth daily. 30 capsule 3   estradiol  (ESTRACE ) 0.5 MG tablet TAKE 1 TABLET DAILY 90 tablet 2   hydrochlorothiazide  (HYDRODIURIL ) 25 MG tablet TAKE 1 TABLET DAILY 90 tablet 3   loratadine  (CLARITIN ) 10 MG tablet Take 1 tablet (10 mg total) by mouth daily. 30 tablet 11   losartan  (COZAAR ) 50 MG  tablet TAKE 1 TABLET TWICE A DAY 180 tablet 1   Melatonin 10 MG TABS Take 10 mg by mouth at bedtime as needed (for sleep).     montelukast  (SINGULAIR ) 10 MG tablet TAKE 1 TABLET AT BEDTIME 90 tablet 3   potassium chloride  SA (KLOR-CON  M) 20 MEQ tablet Take 1 tablet (20 mEq total) by mouth 2 (two) times daily. 60 tablet 1   rosuvastatin  (CRESTOR ) 5 MG tablet TAKE 1 TABLET AT BEDTIME 90 tablet 3   Trospium  Chloride 60 MG CP24 Take 1 capsule (60 mg total) by mouth daily at 8 pm. 90 capsule 0   azelastine  (ASTELIN ) 0.1 % nasal spray Place 1 spray into both nostrils 2 (two) times daily. Use in each nostril as directed 30 mL 12   fluticasone  (FLONASE ) 50 MCG/ACT nasal spray Place 2 sprays into both nostrils 2 (two) times daily as needed for allergies. 16 g 6   WEGOVY  1 MG/0.5ML SOAJ SQ injection ADMINISTER 1 MG UNDER THE SKIN 1 TIME A WEEK 2 mL 1   No facility-administered medications prior to visit.    Allergies  Allergen Reactions   Celebrex [Celecoxib] Hives    Review of Systems  Constitutional:  Negative for appetite change, fatigue and fever.  HENT:  Negative for congestion, ear pain, sinus pressure and sore throat.   Respiratory:  Negative for cough, chest tightness, shortness of breath and wheezing.   Cardiovascular:  Negative for chest pain and palpitations.  Gastrointestinal:  Negative for abdominal pain, constipation, diarrhea, nausea and vomiting.  Genitourinary:  Negative for dysuria and hematuria.  Musculoskeletal:  Positive for myalgias (right side sciatica pain). Negative for arthralgias, back pain and joint swelling.  Skin:  Negative for rash.  Neurological:  Negative for dizziness, weakness and headaches.  Psychiatric/Behavioral:  Negative for dysphoric mood. The patient is not nervous/anxious.        Objective:        07/10/2024    8:07 AM 07/09/2024  9:51 AM 06/07/2024    7:42 AM  Vitals with BMI  Height 5' 6 5' 5 5' 5  Weight 190 lbs 190 lbs 189 lbs 6 oz  BMI  30.68 31.62 31.52  Systolic 131 122 877  Diastolic 81 82 80  Pulse 80 84 76    Orthostatic VS for the past 72 hrs (Last 3 readings):  Patient Position BP Location  07/09/24 0951 Sitting Left Arm     Physical Exam Vitals reviewed.  Constitutional:      Appearance: Normal appearance.  Neck:     Vascular: No carotid bruit.  Cardiovascular:     Rate and Rhythm: Normal rate and regular rhythm.     Heart sounds: Normal heart sounds.  Pulmonary:     Effort: Pulmonary effort is normal.     Breath sounds: Normal breath sounds.  Abdominal:     General: Bowel sounds are normal.     Palpations: Abdomen is soft.     Tenderness: There is no abdominal tenderness.  Musculoskeletal:        General: Tenderness present.  Neurological:     Mental Status: She is alert and oriented to person, place, and time.  Psychiatric:        Mood and Affect: Mood normal.        Behavior: Behavior normal.     Health Maintenance Due  Topic Date Due   Lung Cancer Screening  11/30/2023    There are no preventive care reminders to display for this patient.   Lab Results  Component Value Date   TSH 1.850 12/07/2023   Lab Results  Component Value Date   WBC 6.8 06/07/2024   HGB 12.4 06/07/2024   HCT 38.0 06/07/2024   MCV 91 06/07/2024   PLT 203 06/07/2024   Lab Results  Component Value Date   NA 138 07/05/2024   K 3.4 (L) 07/05/2024   CO2 24 07/05/2024   GLUCOSE 88 07/05/2024   BUN 15 07/05/2024   CREATININE 0.70 07/05/2024   BILITOT 0.4 07/05/2024   ALKPHOS 90 07/05/2024   AST 19 07/05/2024   ALT 18 07/05/2024   PROT 6.3 07/05/2024   ALBUMIN 4.1 07/05/2024   CALCIUM  9.2 07/05/2024   ANIONGAP 13 08/02/2018   EGFR 99 07/05/2024   Lab Results  Component Value Date   CHOL 149 06/07/2024   Lab Results  Component Value Date   HDL 51 06/07/2024   Lab Results  Component Value Date   LDLCALC 80 06/07/2024   Lab Results  Component Value Date   TRIG 96 06/07/2024   Lab Results   Component Value Date   CHOLHDL 2.9 06/07/2024   Lab Results  Component Value Date   HGBA1C 5.2 06/07/2024       Assessment & Plan:  Acute right-sided low back pain with right-sided sciatica Assessment & Plan: Chronic right-sided sciatica with frequent flares, exacerbated by prolonged standing. Planning a trip to [place], which may exacerbate symptoms. - Administer high-dose steroid injection on the right side near the SI joint. - Prescribe a 5-day taper of oral prednisone  starting the Saturday before the trip, with caution about fluid retention and weight gain. - Advise wearing a knee brace during the trip.  Orders: -     predniSONE ; Take 3 tablets (60 mg total) by mouth daily with breakfast for 3 days, THEN 2 tablets (40 mg total) daily with breakfast for 3 days, THEN 1 tablet (20 mg total) daily with breakfast for 3 days. (  Patient not taking: No sig reported)  Dispense: 18 tablet; Refill: 0 -     Triamcinolone  Acetonide  Arthralgia of right knee Assessment & Plan: Chronic right knee pain managed with a Tommy Copper sleeve, providing some relief. - Continue using the Tommy Copper knee sleeve for support.   Nonscarring hair loss Assessment & Plan: Hair loss noted for about a month, possibly related to Wegovy  use. Thyroid function is normal. - Increase Wegovy  dose to 1.7 mg and monitor for side effects and efficacy. - Consider switching to Mounjaro or Zepbound if no improvement or side effects occur.   Obesity with body mass index 30 or greater Assessment & Plan: Obesity management with Wegovy , currently at 1 mg with no significant weight loss. No side effects reported at current dose. - Increase Wegovy  dose to 1.7 mg and reassess weight loss progress.   Hypokalemia Assessment & Plan: Hypokalemia with recent potassium level at 3.4, improved from 2.8. Considering diuretic adjustment to prevent further potassium loss. - Continue potassium supplementation. - Reduce  hydrochlorothiazide  to every other day for one week, then discontinue. - Recheck potassium levels after returning from [place] trip.   Primary hypertension Assessment & Plan: Blood pressure well-controlled at 122/82. Considering diuretic adjustment to manage hypokalemia. - Monitor blood pressure closely, especially after diuretic adjustment. - If blood pressure rises above 140/90, consider resuming hydrochlorothiazide  every other day.   Gastroesophageal reflux disease without esophagitis Assessment & Plan: GERD managed with Nexium , which is effective. Insurance did not cover Voquezna , but Nexium  is a suitable alternative. - Continue Nexium  for GERD management. - Monitor for any reflux symptoms and report if she occurs.   Other orders -     Wegovy ; Inject 1.7 mg into the skin once a week.  Dispense: 3 mL; Refill: 1     Meds ordered this encounter  Medications   semaglutide -weight management (WEGOVY ) 1.7 MG/0.75ML SOAJ SQ injection    Sig: Inject 1.7 mg into the skin once a week.    Dispense:  3 mL    Refill:  1   predniSONE  (DELTASONE ) 20 MG tablet    Sig: Take 3 tablets (60 mg total) by mouth daily with breakfast for 3 days, THEN 2 tablets (40 mg total) daily with breakfast for 3 days, THEN 1 tablet (20 mg total) daily with breakfast for 3 days.    Dispense:  18 tablet    Refill:  0   triamcinolone  acetonide (KENALOG -40) injection 80 mg    No orders of the defined types were placed in this encounter.    Follow-up: Return in about 24 days (around 08/02/2024) for lab visit.  An After Visit Summary was printed and given to the patient.   I,Lauren M Auman,acting as a Neurosurgeon for [employer], PA.,have documented all relevant documentation on the behalf of Nola Angles, PA,as directed by  Nola Angles, PA while in the presence of Nola Angles, [place].    LILLETTE Kato I Leal-Borjas,acting as a scribe for Nola Angles, PA.,have documented all relevant documentation on the behalf of Nola Angles, PA,as directed by  Nola Angles, PA while in the presence of Nola Angles, [place].    Nola Angles, [place] [facility] [phone]

## 2024-07-10 ENCOUNTER — Other Ambulatory Visit: Payer: Self-pay | Admitting: Physician Assistant

## 2024-07-10 ENCOUNTER — Ambulatory Visit: Admitting: Plastic Surgery

## 2024-07-10 ENCOUNTER — Ambulatory Visit: Payer: Self-pay | Admitting: Physician Assistant

## 2024-07-10 ENCOUNTER — Encounter: Payer: Self-pay | Admitting: Plastic Surgery

## 2024-07-10 VITALS — BP 131/81 | HR 80 | Ht 66.0 in | Wt 190.0 lb

## 2024-07-10 DIAGNOSIS — J309 Allergic rhinitis, unspecified: Secondary | ICD-10-CM

## 2024-07-10 DIAGNOSIS — R220 Localized swelling, mass and lump, head: Secondary | ICD-10-CM

## 2024-07-10 DIAGNOSIS — J018 Other acute sinusitis: Secondary | ICD-10-CM

## 2024-07-10 DIAGNOSIS — M898X8 Other specified disorders of bone, other site: Secondary | ICD-10-CM | POA: Insufficient documentation

## 2024-07-10 MED ORDER — AZELASTINE HCL 0.1 % NA SOLN: 1.0000 | Freq: Two times a day (BID) | NASAL | 12 refills | Status: AC

## 2024-07-10 MED ORDER — FLUTICASONE PROPIONATE 50 MCG/ACT NA SUSP: 2.0000 | Freq: Two times a day (BID) | NASAL | 6 refills | Status: AC | PRN

## 2024-07-10 NOTE — Telephone Encounter (Signed)
 Copied from CRM [id number]. Topic: Clinical - Medication Refill >> Jul 10, 2024  1:01 PM Deleta S wrote: Medication: azelastine  (ASTELIN ) 0.1 % nasal spray [[id number]] and fluticasone  (FLONASE ) 50 MCG/ACT nasal spray  Has the patient contacted their pharmacy? Yes (Agent: If no, request that the patient contact the pharmacy for the refill. If patient does not wish to contact the pharmacy document the reason why and proceed with request.) (Agent: If yes, when and what did the pharmacy advise?)  This is the patient's preferred pharmacy:    [id number] [place], [place] - [address] AT [address] [place] [address] Phone: [phone] Fax: [phone]  Is this the correct pharmacy for this prescription? Yes If no, delete pharmacy and type the correct one.   Has the prescription been filled recently? unsure  Is the patient out of the medication? unsure  Has the patient been seen for an appointment in the last year OR does the patient have an upcoming appointment? Yes  Can we respond through MyChart? unsure  Agent: Please be advised that Rx refills may take up to 3 business days. We ask that you follow-up with your pharmacy.  [facility] calling to fill

## 2024-07-10 NOTE — Progress Notes (Signed)
 Patient ID: Colleen Larsen, female    DOB: [birth year], 58 y.o.   MRN: [id number]   Chief Complaint  Patient presents with   Consult    The patient is a 58 year old female here for evaluation of her forehead.  She has had a firm mass on the central portion of her superior forehead for the past several years.  It seems to be getting larger.  She is not aware of any injury or trauma to the area.  Her past medical and surgical history is listed below.  The area is about 2 cm in size.  It is firm and not movable.  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    Review of Systems  Constitutional: Negative.   Eyes: Negative.   Respiratory: Negative.    Cardiovascular: Negative.   Gastrointestinal: Negative.   Endocrine: Negative.   Genitourinary: Negative.   Musculoskeletal: Negative.     Past Medical History:  Diagnosis Date   Allergic rhinitis, seasonal    COPD (chronic obstructive pulmonary disease) (HCC)    GAD (generalized anxiety disorder)    GERD (gastroesophageal reflux disease)    History of adenomatous polyp of colon    Hyperlipidemia, mixed    Hypertension    MDD (major depressive disorder)    Mixed stress and urge urinary incontinence    OA (osteoarthritis)    OAB (overactive bladder)    Tobacco user 12/07/2023   Wears glasses     Past Surgical History:  Procedure Laterality Date   ANTERIOR CERVICAL DECOMP/DISCECTOMY FUSION  02/14/2007   @[facility]  by dr pool   BILATERAL CARPAL TUNNLE RELEASE Bilateral    1990s   BLADDER SUSPENSION N/A 02/14/2023   Procedure: TRANSVAGINAL TAPE (TVT) PROCEDURE;  Surgeon: Marilynne Rosaline SAILOR, MD;  Location: [facility];  Service: Gynecology;  Laterality: N/A;  Total time requested is 1 hour   CERVICAL SPINE SURGERY  04/28/2010   @[facility]  by dr pool;   RE-EXPLORATION C5-6 FUSION / REMOVAL HARDWARE AND ACDF C6-7   CHOLECYSTECTOMY OPEN  1981   CYSTOSCOPY N/A 02/14/2023   Procedure: CYSTOSCOPY;  Surgeon: Marilynne Rosaline SAILOR, MD;   Location: [facility];  Service: Gynecology;  Laterality: N/A;   DEBRIDEMENT TENNIS ELBOW Right    1990s   KNEE CLOSED REDUCTION Right 08/02/2018   Procedure: CLOSED MANIPULATION RIGHT TOTAL KNEE;  Surgeon: Heide Ingle, MD;  Location: [facility];  Service: Orthopedics;  Laterality: Right;    LAPAROSCOPIC ASSISTED VAGINAL HYSTERECTOMY  2003   TONSILLECTOMY     child   TOTAL KNEE ARTHROPLASTY Right 05/17/2018   Procedure: RIGHT TOTAL KNEE ARTHROPLASTY, INSERTION OF BONE GRAFT IN FEMORAL CANAL;  Surgeon: Heide Ingle, MD;  Location: [facility];  Service: Orthopedics;  Laterality: Right;   TUBAL LIGATION Bilateral    yrs ago      Current Outpatient Medications:    albuterol  (VENTOLIN  HFA) 108 (90 Base) MCG/ACT inhaler, Inhale 2 puffs into the lungs every 6 (six) hours as needed for wheezing or shortness of breath., Disp: 8 g, Rfl: 2   azelastine  (ASTELIN ) 0.1 % nasal spray, Place 1 spray into both nostrils 2 (two) times daily. Use in each nostril as directed, Disp: 30 mL, Rfl: 12   cetirizine (ZYRTEC) 10 MG tablet, Take 10 mg by mouth daily as needed for allergies. , Disp: , Rfl:    diclofenac  (VOLTAREN ) 50 MG EC tablet, TAKE 1 TABLET(50 MG) BY MOUTH DAILY, Disp: 30 tablet, Rfl: 3  escitalopram  (LEXAPRO ) 10 MG tablet, TAKE 1 TABLET AT BEDTIME, Disp: 90 tablet, Rfl: 3   esomeprazole  (NEXIUM ) 40 MG capsule, Take 1 capsule (40 mg total) by mouth daily., Disp: 30 capsule, Rfl: 3   estradiol  (ESTRACE ) 0.5 MG tablet, TAKE 1 TABLET DAILY, Disp: 90 tablet, Rfl: 2   fluticasone  (FLONASE ) 50 MCG/ACT nasal spray, Place 2 sprays into both nostrils 2 (two) times daily as needed for allergies., Disp: 16 g, Rfl: 6   hydrochlorothiazide  (HYDRODIURIL ) 25 MG tablet, TAKE 1 TABLET DAILY, Disp: 90 tablet, Rfl: 3   loratadine  (CLARITIN ) 10 MG tablet, Take 1 tablet (10 mg total) by mouth daily., Disp: 30 tablet, Rfl: 11   losartan  (COZAAR ) 50 MG tablet, TAKE 1 TABLET TWICE A DAY, Disp: 180  tablet, Rfl: 1   Melatonin 10 MG TABS, Take 10 mg by mouth at bedtime as needed (for sleep)., Disp: , Rfl:    montelukast  (SINGULAIR ) 10 MG tablet, TAKE 1 TABLET AT BEDTIME, Disp: 90 tablet, Rfl: 3   potassium chloride  SA (KLOR-CON  M) 20 MEQ tablet, Take 1 tablet (20 mEq total) by mouth 2 (two) times daily., Disp: 60 tablet, Rfl: 1   rosuvastatin  (CRESTOR ) 5 MG tablet, TAKE 1 TABLET AT BEDTIME, Disp: 90 tablet, Rfl: 3   semaglutide -weight management (WEGOVY ) 1.7 MG/0.75ML SOAJ SQ injection, Inject 1.7 mg into the skin once a week., Disp: 3 mL, Rfl: 1   Trospium  Chloride 60 MG CP24, Take 1 capsule (60 mg total) by mouth daily at 8 pm., Disp: 90 capsule, Rfl: 0   acetaminophen  (TYLENOL ) 500 MG tablet, Take 1 tablet (500 mg total) by mouth every 6 (six) hours as needed (pain). (Patient not taking: Reported on 07/10/2024), Disp: 30 tablet, Rfl: 0   predniSONE  (DELTASONE ) 20 MG tablet, Take 3 tablets (60 mg total) by mouth daily with breakfast for 3 days, THEN 2 tablets (40 mg total) daily with breakfast for 3 days, THEN 1 tablet (20 mg total) daily with breakfast for 3 days. (Patient not taking: No sig reported), Disp: 18 tablet, Rfl: 0   Objective:   Vitals:   07/10/24 0807  BP: 131/81  Pulse: 80  SpO2: 97%    Physical Exam HENT:     Head: Atraumatic.   Cardiovascular:     Rate and Rhythm: Normal rate.     Pulses: Normal pulses.  Pulmonary:     Effort: Pulmonary effort is normal.  Musculoskeletal:        General: No swelling.  Skin:    General: Skin is warm.     Capillary Refill: Capillary refill takes less than 2 seconds.     Coloration: Skin is not jaundiced.     Findings: Lesion present. No bruising.  Neurological:     Mental Status: She is alert and oriented to person, place, and time.  Psychiatric:        Mood and Affect: Mood normal.        Behavior: Behavior normal.        Thought Content: Thought content normal.        Judgment: Judgment normal.     Assessment & Plan:   Skull mass  Recommend excision of mass of forehead.  This has the potential to get larger and could become a little more difficult to remove.  Be due to the location being so close to the scalp I think doing it in the OR would be the best option.  Pictures were obtained of the patient and placed in  the chart with the patient's or guardian's permission.   Estefana RAMAN Quadry Kampa, DO

## 2024-07-11 DIAGNOSIS — M5441 Lumbago with sciatica, right side: Secondary | ICD-10-CM | POA: Insufficient documentation

## 2024-07-11 DIAGNOSIS — L659 Nonscarring hair loss, unspecified: Secondary | ICD-10-CM | POA: Insufficient documentation

## 2024-07-11 NOTE — Assessment & Plan Note (Signed)
 Hair loss noted for about a month, possibly related to Wegovy  use. Thyroid function is normal. - Increase Wegovy  dose to 1.7 mg and monitor for side effects and efficacy. - Consider switching to Mounjaro or Zepbound if no improvement or side effects occur.

## 2024-07-11 NOTE — Assessment & Plan Note (Signed)
 GERD managed with Nexium , which is effective. Insurance did not cover Voquezna , but Nexium  is a suitable alternative. - Continue Nexium  for GERD management. - Monitor for any reflux symptoms and report if she occurs.

## 2024-07-11 NOTE — Assessment & Plan Note (Signed)
 Blood pressure well-controlled at 122/82. Considering diuretic adjustment to manage hypokalemia. - Monitor blood pressure closely, especially after diuretic adjustment. - If blood pressure rises above 140/90, consider resuming hydrochlorothiazide  every other day.

## 2024-07-11 NOTE — Assessment & Plan Note (Signed)
 Hypokalemia with recent potassium level at 3.4, improved from 2.8. Considering diuretic adjustment to prevent further potassium loss. - Continue potassium supplementation. - Reduce hydrochlorothiazide  to every other day for one week, then discontinue. - Recheck potassium levels after returning from [place] trip.

## 2024-07-11 NOTE — Assessment & Plan Note (Signed)
 Chronic right knee pain managed with a Tommy Copper sleeve, providing some relief. - Continue using the Tommy Copper knee sleeve for support.

## 2024-07-11 NOTE — Assessment & Plan Note (Signed)
 Obesity management with Wegovy , currently at 1 mg with no significant weight loss. No side effects reported at current dose. - Increase Wegovy  dose to 1.7 mg and reassess weight loss progress.

## 2024-07-11 NOTE — Assessment & Plan Note (Signed)
 Chronic right-sided sciatica with frequent flares, exacerbated by prolonged standing. Planning a trip to [place], which may exacerbate symptoms. - Administer high-dose steroid injection on the right side near the SI joint. - Prescribe a 5-day taper of oral prednisone  starting the Saturday before the trip, with caution about fluid retention and weight gain. - Advise wearing a knee brace during the trip.

## 2024-07-17 ENCOUNTER — Other Ambulatory Visit: Payer: Self-pay | Admitting: Physician Assistant

## 2024-07-17 DIAGNOSIS — Z716 Tobacco abuse counseling: Secondary | ICD-10-CM

## 2024-07-21 ENCOUNTER — Other Ambulatory Visit: Payer: Self-pay | Admitting: Physician Assistant

## 2024-07-21 DIAGNOSIS — K219 Gastro-esophageal reflux disease without esophagitis: Secondary | ICD-10-CM

## 2024-07-31 ENCOUNTER — Other Ambulatory Visit: Payer: Self-pay

## 2024-07-31 DIAGNOSIS — R899 Unspecified abnormal finding in specimens from other organs, systems and tissues: Secondary | ICD-10-CM

## 2024-08-02 ENCOUNTER — Other Ambulatory Visit

## 2024-08-02 DIAGNOSIS — R899 Unspecified abnormal finding in specimens from other organs, systems and tissues: Secondary | ICD-10-CM

## 2024-08-03 LAB — COMPREHENSIVE METABOLIC PANEL WITH GFR
ALT: 27 IU/L (ref 0–32)
AST: 21 IU/L (ref 0–40)
Albumin: 4.1 g/dL (ref 3.8–4.9)
Alkaline Phosphatase: 79 IU/L (ref 49–135)
BUN/Creatinine Ratio: 21 (ref 12–28)
BUN: 15 mg/dL (ref 8–27)
Bilirubin Total: 0.3 mg/dL (ref 0.0–1.2)
CO2: 26 mmol/L (ref 20–29)
Calcium: 9.3 mg/dL (ref 8.7–10.3)
Chloride: 102 mmol/L (ref 96–106)
Creatinine, Ser: 0.7 mg/dL (ref 0.57–1.00)
Globulin, Total: 2 g/dL (ref 1.5–4.5)
Glucose: 79 mg/dL (ref 70–99)
Potassium: 4.1 mmol/L (ref 3.5–5.2)
Sodium: 143 mmol/L (ref 134–144)
Total Protein: 6.1 g/dL (ref 6.0–8.5)
eGFR: 99 mL/min/1.73 (ref >59)

## 2024-08-06 ENCOUNTER — Ambulatory Visit: Payer: Self-pay | Admitting: Physician Assistant

## 2024-08-09 ENCOUNTER — Other Ambulatory Visit: Payer: Self-pay | Admitting: Physician Assistant

## 2024-08-09 DIAGNOSIS — K219 Gastro-esophageal reflux disease without esophagitis: Secondary | ICD-10-CM

## 2024-08-13 ENCOUNTER — Other Ambulatory Visit: Payer: Self-pay | Admitting: Obstetrics and Gynecology

## 2024-08-13 DIAGNOSIS — N3281 Overactive bladder: Secondary | ICD-10-CM

## 2024-08-20 ENCOUNTER — Ambulatory Visit (INDEPENDENT_AMBULATORY_CARE_PROVIDER_SITE_OTHER): Admitting: Student

## 2024-08-20 ENCOUNTER — Telehealth: Payer: Self-pay | Admitting: Plastic Surgery

## 2024-08-20 VITALS — BP 136/81 | HR 88 | Wt 188.0 lb

## 2024-08-20 DIAGNOSIS — R220 Localized swelling, mass and lump, head: Secondary | ICD-10-CM

## 2024-08-20 DIAGNOSIS — M898X8 Other specified disorders of bone, other site: Secondary | ICD-10-CM

## 2024-08-20 DIAGNOSIS — Z719 Counseling, unspecified: Secondary | ICD-10-CM

## 2024-08-20 MED ORDER — ONDANSETRON HCL 4 MG PO TABS: 4.0000 mg | ORAL_TABLET | Freq: Three times a day (TID) | ORAL | 0 refills | Status: DC | PRN

## 2024-08-20 MED ORDER — OXYCODONE HCL 5 MG PO TABS: 5.0000 mg | ORAL_TABLET | Freq: Four times a day (QID) | ORAL | 0 refills | Status: DC | PRN

## 2024-08-20 MED ORDER — CEPHALEXIN 500 MG PO CAPS: 500.0000 mg | ORAL_CAPSULE | Freq: Four times a day (QID) | ORAL | 0 refills | Status: AC

## 2024-08-20 NOTE — Telephone Encounter (Signed)
 Yes that's fine

## 2024-08-20 NOTE — Telephone Encounter (Signed)
 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 Please advise

## 2024-08-20 NOTE — Progress Notes (Signed)
 Patient ID: Colleen Larsen, female    DOB: 11-05-1965, 58 y.o.   MRN: [id number]  Chief Complaint  Patient presents with   Pre-op Exam      ICD-10-CM   1. Skull mass  M89.8X8        History of Present Illness: Colleen Larsen is a 57 y.o.  female  with a history of forehead mass.  She presents for preoperative evaluation for upcoming procedure, excision of forehead/skull mass, scheduled for 09/06/2024 with Dr. Lowery.  The patient has not had problems with anesthesia.  Patient denies any history of cardiac disease.  She denies taking any blood thinners.  Patient reports she is not a smoker.  Patient states that she is currently taking estradiol  for hot flashes.  She denies any history of miscarriages.  She denies any personal family history of blood clots or clotting diseases.  She denies any recent surgeries, traumas or infections.  She denies any history of stroke or heart attack.  She denies any history of Crohn's disease, ulcerative colitis or asthma.  She does report that she has COPD, she denies any issues with her COPD.  She states that she does not follow-up with pulmonology and that her PCP manages her COPD.  She denies any history of cancer.  She denies any varicosities to her lower extremities.  She denies any recent fevers, chills or changes in her health.  Summary of Previous Visit: Patient was seen for initial consult by Dr. Lowery on 07/10/2024.  At this visit, patient reported she had a firm mass on the central portion of her superior forehead for the past several years and it was getting larger.  Patient was not aware of any injury or trauma to the area.  The area was about 2 cm in size, it was firm and not movable.  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  Excision of mass of the forehead was recommended.  Job: Label Designer, television/film set, planning to take 1 week off  PMH Significant for: COPD, mass to the head, GERD, osteoarthritis, GAD   Past Medical History: Allergies: Allergies   Allergen Reactions   Celebrex [Celecoxib] Hives    Current Medications:  Current Outpatient Medications:    albuterol  (VENTOLIN  HFA) 108 (90 Base) MCG/ACT inhaler, Inhale 2 puffs into the lungs every 6 (six) hours as needed for wheezing or shortness of breath., Disp: 8 g, Rfl: 2   azelastine  (ASTELIN ) 0.1 % nasal spray, Place 1 spray into both nostrils 2 (two) times daily. Use in each nostril as directed, Disp: 30 mL, Rfl: 12   cephALEXin  (KEFLEX ) 500 MG capsule, Take 1 capsule (500 mg total) by mouth 4 (four) times daily for 3 days., Disp: 12 capsule, Rfl: 0   escitalopram  (LEXAPRO ) 10 MG tablet, TAKE 1 TABLET AT BEDTIME, Disp: 90 tablet, Rfl: 3   esomeprazole  (NEXIUM ) 40 MG capsule, Take 1 capsule (40 mg total) by mouth daily., Disp: 90 capsule, Rfl: 2   estradiol  (ESTRACE ) 0.5 MG tablet, TAKE 1 TABLET DAILY, Disp: 90 tablet, Rfl: 2   fluticasone  (FLONASE ) 50 MCG/ACT nasal spray, Place 2 sprays into both nostrils 2 (two) times daily as needed for allergies., Disp: 16 g, Rfl: 6   hydrochlorothiazide  (HYDRODIURIL ) 25 MG tablet, TAKE 1 TABLET DAILY, Disp: 90 tablet, Rfl: 3   loratadine  (CLARITIN ) 10 MG tablet, Take 1 tablet (10 mg total) by mouth daily., Disp: 30 tablet, Rfl: 11   losartan  (COZAAR ) 50 MG tablet, TAKE 1 TABLET TWICE A  DAY, Disp: 180 tablet, Rfl: 1   Melatonin 10 MG TABS, Take 10 mg by mouth at bedtime as needed (for sleep)., Disp: , Rfl:    montelukast  (SINGULAIR ) 10 MG tablet, TAKE 1 TABLET AT BEDTIME, Disp: 90 tablet, Rfl: 3   ondansetron  (ZOFRAN ) 4 MG tablet, Take 1 tablet (4 mg total) by mouth every 8 (eight) hours as needed for up to 10 doses for nausea or vomiting., Disp: 10 tablet, Rfl: 0   oxyCODONE  (ROXICODONE ) 5 MG immediate release tablet, Take 1 tablet (5 mg total) by mouth every 6 (six) hours as needed for up to 8 doses for severe pain (pain score 7-10)., Disp: 8 tablet, Rfl: 0   potassium chloride  SA (KLOR-CON  M) 20 MEQ tablet, Take 1 tablet (20 mEq total) by mouth  2 (two) times daily., Disp: 60 tablet, Rfl: 1   rosuvastatin  (CRESTOR ) 5 MG tablet, TAKE 1 TABLET AT BEDTIME, Disp: 90 tablet, Rfl: 3   Trospium  Chloride 60 MG CP24, Take 1 capsule (60 mg total) by mouth daily at 8 pm., Disp: 90 capsule, Rfl: 0   acetaminophen  (TYLENOL ) 500 MG tablet, Take 1 tablet (500 mg total) by mouth every 6 (six) hours as needed (pain). (Patient not taking: Reported on 08/20/2024), Disp: 30 tablet, Rfl: 0   cetirizine (ZYRTEC) 10 MG tablet, Take 10 mg by mouth daily as needed for allergies.  (Patient not taking: Reported on 08/20/2024), Disp: , Rfl:    diclofenac  (VOLTAREN ) 50 MG EC tablet, TAKE 1 TABLET(50 MG) BY MOUTH DAILY (Patient not taking: Reported on 08/20/2024), Disp: 30 tablet, Rfl: 3   semaglutide -weight management (WEGOVY ) 1.7 MG/0.75ML SOAJ SQ injection, Inject 1.7 mg into the skin once a week. (Patient not taking: Reported on 08/20/2024), Disp: 3 mL, Rfl: 1  Past Medical Problems: Past Medical History:  Diagnosis Date   Allergic rhinitis, seasonal    COPD (chronic obstructive pulmonary disease) (HCC)    GAD (generalized anxiety disorder)    GERD (gastroesophageal reflux disease)    History of adenomatous polyp of colon    Hyperlipidemia, mixed    Hypertension    MDD (major depressive disorder)    Mixed stress and urge urinary incontinence    OA (osteoarthritis)    OAB (overactive bladder)    Tobacco user 12/07/2023   Wears glasses     Past Surgical History: Past Surgical History:  Procedure Laterality Date   ANTERIOR CERVICAL DECOMP/DISCECTOMY FUSION  02/14/2007   @[facility]  by dr pool   BILATERAL CARPAL TUNNLE RELEASE Bilateral    1990s   BLADDER SUSPENSION N/A 02/14/2023   Procedure: TRANSVAGINAL TAPE (TVT) PROCEDURE;  Surgeon: Marilynne Rosaline SAILOR, MD;  Location: [facility];  Service: Gynecology;  Laterality: N/A;  Total time requested is 1 hour   CERVICAL SPINE SURGERY  04/28/2010   @[facility]  by dr pool;   RE-EXPLORATION C5-6 FUSION /  REMOVAL HARDWARE AND ACDF C6-7   CHOLECYSTECTOMY OPEN  1981   CYSTOSCOPY N/A 02/14/2023   Procedure: CYSTOSCOPY;  Surgeon: Marilynne Rosaline SAILOR, MD;  Location: [facility];  Service: Gynecology;  Laterality: N/A;   DEBRIDEMENT TENNIS ELBOW Right    1990s   KNEE CLOSED REDUCTION Right 08/02/2018   Procedure: CLOSED MANIPULATION RIGHT TOTAL KNEE;  Surgeon: Heide Ingle, MD;  Location: [facility];  Service: Orthopedics;  Laterality: Right;    LAPAROSCOPIC ASSISTED VAGINAL HYSTERECTOMY  2003   TONSILLECTOMY     child   TOTAL KNEE ARTHROPLASTY Right 05/17/2018   Procedure:  RIGHT TOTAL KNEE ARTHROPLASTY, INSERTION OF BONE GRAFT IN FEMORAL CANAL;  Surgeon: Heide Ingle, MD;  Location: [facility];  Service: Orthopedics;  Laterality: Right;   TUBAL LIGATION Bilateral    yrs ago    Social History: Social History   Socioeconomic History   Marital status: Married    Spouse name: Not on file   Number of children: Not on file   Years of education: Not on file   Highest education level: Not on file  Occupational History   Not on file  Tobacco Use   Smoking status: Former    Current packs/day: 0.00    Average packs/day: 1 pack/day for 30.0 years (30.0 ttl pk-yrs)    Types: Cigarettes    Quit date: 06/25/2023    Years since quitting: 1.1   Smokeless tobacco: Never  Vaping Use   Vaping status: Some Days   Substances: Nicotine, Flavoring   Devices: buse  Substance and Sexual Activity   Alcohol use: Yes    Comment: RARE   Drug use: Never   Sexual activity: Yes    Partners: Male    Birth control/protection: Surgical  Other Topics Concern   Not on file  Social History Narrative   Not on file   Social Drivers of Health   Financial Resource Strain: Low Risk  (07/27/2022)   Overall Financial Resource Strain (CARDIA)    Difficulty of Paying Living Expenses: Not hard at all  Food Insecurity: No Food Insecurity (07/27/2022)   Hunger Vital Sign    Worried About Running  Out of Food in the Last Year: Never true    Ran Out of Food in the Last Year: Never true  Transportation Needs: No Transportation Needs (07/27/2022)   PRAPARE - Administrator, Civil Service (Medical): No    Lack of Transportation (Non-Medical): No  Physical Activity: Inactive (07/27/2022)   Exercise Vital Sign    Days of Exercise per Week: 0 days    Minutes of Exercise per Session: 0 min  Stress: No Stress Concern Present (07/27/2022)   [employer] of Occupational Health - Occupational Stress Questionnaire    Feeling of Stress : Not at all  Social Connections: Moderately Integrated (07/27/2022)   Social Connection and Isolation Panel    Frequency of Communication with Friends and Family: More than three times a week    Frequency of Social Gatherings with Friends and Family: More than three times a week    Attends Religious Services: 1 to 4 times per year    Active Member of [employer] or Organizations: No    Attends Banker Meetings: Never    Marital Status: Married  Catering manager Violence: Not At Risk (07/27/2022)   Humiliation, Afraid, Rape, and Kick questionnaire    Fear of Current or Ex-Partner: No    Emotionally Abused: No    Physically Abused: No    Sexually Abused: No    Family History: Family History  Problem Relation Age of Onset   Cancer Mother    Hypertension Mother    Hyperlipidemia Mother    Rectal cancer Mother    Hypertension Father    Hyperlipidemia Father    Heart Problems Father    Arthritis Father    COPD Father    Hypertension Brother    Hypertension Paternal Aunt    Heart Problems Paternal Aunt    Hypertension Paternal Aunt    Hyperlipidemia Paternal Aunt    Hypertension Paternal Aunt    Hyperlipidemia  Paternal Aunt    Skin cancer Paternal Aunt    Heart Problems Paternal Aunt    Breast cancer Maternal Grandmother    Hypertension Paternal Grandmother    Heart Problems Paternal Grandmother    Hypertension Paternal  Grandfather    Stroke Paternal Grandfather    Hyperlipidemia Paternal Grandfather    Colon cancer Neg Hx    Colon polyps Neg Hx    Esophageal cancer Neg Hx    Stomach cancer Neg Hx     Review of Systems: Denies any recent fevers, chills or changes in her health  Physical Exam: Vital Signs BP 136/81 (BP Location: Left Arm, Patient Position: Sitting, Cuff Size: Large)   Pulse 88   Wt 188 lb (85.3 kg)   SpO2 100%   BMI 30.34 kg/m   Physical Exam  Constitutional:      General: Not in acute distress.    Appearance: Normal appearance. Not ill-appearing.  HENT:     Head: Normocephalic and atraumatic.  Neck:     Musculoskeletal: Normal range of motion.  Cardiovascular:     Rate and Rhythm: Normal rate Pulmonary:     Effort: Pulmonary effort is normal. No respiratory distress.  Musculoskeletal: Normal range of motion.  Skin:    General: Skin is warm and dry.     Findings: No erythema or rash.  Neurological:     Mental Status: Alert and oriented to person, place, and time. Mental status is at baseline.  Psychiatric:        Mood and Affect: Mood normal.        Behavior: Behavior normal.    Assessment/Plan: The patient is scheduled for excision of forehead/skull mass with Dr. Lowery.  Risks, benefits, and alternatives of procedure discussed, questions answered and consent obtained.    Will send clearance to patient's PCP.  Smoking Status: Quit 14 months ago, counseling Given?  Discussed with her the importance of continuing smoking cessation as smoking can inhibit wound healing.  Patient expressed understanding.  Caprini Score: 6; Risk Factors include: Age, BMI > 25, hormone replacement, COPD and length of planned surgery. Recommendation for mechanical prophylaxis. Encourage early ambulation.   Pictures obtained: @consult   Post-op Rx sent to pharmacy: Oxycodone , Zofran , Keflex   Instructed the patient to hold estradiol  2 weeks before and 2 weeks after surgery.  We  discussed the risks of blood clots with estrogen therapy.  Patient expressed understanding and states that she will be able to hold this medication.  Discussed with the patient to hold her losartan  and hydrochlorothiazide  the day of surgery.  Patient states she is no longer taking Wegovy  and no longer taking diclofenac .  Instructed patient to hold any multivitamins or supplements at least 1 week prior to surgery.  Understanding.  Patient was provided with the General Surgical Risk consent document and Pain Medication Agreement prior to their appointment.  They had adequate time to read through the risk consent documents and Pain Medication Agreement. We also discussed them in person together during this preop appointment. All of their questions were answered to their satisfaction.  Recommended calling if they have any further questions.  Risk consent form and Pain Medication Agreement to be scanned into patient's chart.  The consent was obtained with risks and complications reviewed which included bleeding, pain, scar, infection and the risk of anesthesia.  The patients questions were answered to the patients expressed satisfaction.   Discussed with the patient that her history of COPD may put her at increased risk  of issues with anesthesia.  Patient expressed understanding.   Electronically signed by: Estefana FORBES Peck, PA-C 08/20/2024 4:39 PM

## 2024-08-20 NOTE — H&P (View-Only) (Signed)
 Patient ID: Colleen Larsen, female    DOB: 11-05-1965, 58 y.o.   MRN: [id number]  Chief Complaint  Patient presents with   Pre-op Exam      ICD-10-CM   1. Skull mass  M89.8X8        History of Present Illness: Colleen Larsen is a 57 y.o.  female  with a history of forehead mass.  She presents for preoperative evaluation for upcoming procedure, excision of forehead/skull mass, scheduled for 09/06/2024 with Dr. Lowery.  The patient has not had problems with anesthesia.  Patient denies any history of cardiac disease.  She denies taking any blood thinners.  Patient reports she is not a smoker.  Patient states that she is currently taking estradiol  for hot flashes.  She denies any history of miscarriages.  She denies any personal family history of blood clots or clotting diseases.  She denies any recent surgeries, traumas or infections.  She denies any history of stroke or heart attack.  She denies any history of Crohn's disease, ulcerative colitis or asthma.  She does report that she has COPD, she denies any issues with her COPD.  She states that she does not follow-up with pulmonology and that her PCP manages her COPD.  She denies any history of cancer.  She denies any varicosities to her lower extremities.  She denies any recent fevers, chills or changes in her health.  Summary of Previous Visit: Patient was seen for initial consult by Dr. Lowery on 07/10/2024.  At this visit, patient reported she had a firm mass on the central portion of her superior forehead for the past several years and it was getting larger.  Patient was not aware of any injury or trauma to the area.  The area was about 2 cm in size, it was firm and not movable.  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  Excision of mass of the forehead was recommended.  Job: Label Designer, television/film set, planning to take 1 week off  PMH Significant for: COPD, mass to the head, GERD, osteoarthritis, GAD   Past Medical History: Allergies: Allergies   Allergen Reactions   Celebrex [Celecoxib] Hives    Current Medications:  Current Outpatient Medications:    albuterol  (VENTOLIN  HFA) 108 (90 Base) MCG/ACT inhaler, Inhale 2 puffs into the lungs every 6 (six) hours as needed for wheezing or shortness of breath., Disp: 8 g, Rfl: 2   azelastine  (ASTELIN ) 0.1 % nasal spray, Place 1 spray into both nostrils 2 (two) times daily. Use in each nostril as directed, Disp: 30 mL, Rfl: 12   cephALEXin  (KEFLEX ) 500 MG capsule, Take 1 capsule (500 mg total) by mouth 4 (four) times daily for 3 days., Disp: 12 capsule, Rfl: 0   escitalopram  (LEXAPRO ) 10 MG tablet, TAKE 1 TABLET AT BEDTIME, Disp: 90 tablet, Rfl: 3   esomeprazole  (NEXIUM ) 40 MG capsule, Take 1 capsule (40 mg total) by mouth daily., Disp: 90 capsule, Rfl: 2   estradiol  (ESTRACE ) 0.5 MG tablet, TAKE 1 TABLET DAILY, Disp: 90 tablet, Rfl: 2   fluticasone  (FLONASE ) 50 MCG/ACT nasal spray, Place 2 sprays into both nostrils 2 (two) times daily as needed for allergies., Disp: 16 g, Rfl: 6   hydrochlorothiazide  (HYDRODIURIL ) 25 MG tablet, TAKE 1 TABLET DAILY, Disp: 90 tablet, Rfl: 3   loratadine  (CLARITIN ) 10 MG tablet, Take 1 tablet (10 mg total) by mouth daily., Disp: 30 tablet, Rfl: 11   losartan  (COZAAR ) 50 MG tablet, TAKE 1 TABLET TWICE A  DAY, Disp: 180 tablet, Rfl: 1   Melatonin 10 MG TABS, Take 10 mg by mouth at bedtime as needed (for sleep)., Disp: , Rfl:    montelukast  (SINGULAIR ) 10 MG tablet, TAKE 1 TABLET AT BEDTIME, Disp: 90 tablet, Rfl: 3   ondansetron  (ZOFRAN ) 4 MG tablet, Take 1 tablet (4 mg total) by mouth every 8 (eight) hours as needed for up to 10 doses for nausea or vomiting., Disp: 10 tablet, Rfl: 0   oxyCODONE  (ROXICODONE ) 5 MG immediate release tablet, Take 1 tablet (5 mg total) by mouth every 6 (six) hours as needed for up to 8 doses for severe pain (pain score 7-10)., Disp: 8 tablet, Rfl: 0   potassium chloride  SA (KLOR-CON  M) 20 MEQ tablet, Take 1 tablet (20 mEq total) by mouth  2 (two) times daily., Disp: 60 tablet, Rfl: 1   rosuvastatin  (CRESTOR ) 5 MG tablet, TAKE 1 TABLET AT BEDTIME, Disp: 90 tablet, Rfl: 3   Trospium  Chloride 60 MG CP24, Take 1 capsule (60 mg total) by mouth daily at 8 pm., Disp: 90 capsule, Rfl: 0   acetaminophen  (TYLENOL ) 500 MG tablet, Take 1 tablet (500 mg total) by mouth every 6 (six) hours as needed (pain). (Patient not taking: Reported on 08/20/2024), Disp: 30 tablet, Rfl: 0   cetirizine (ZYRTEC) 10 MG tablet, Take 10 mg by mouth daily as needed for allergies.  (Patient not taking: Reported on 08/20/2024), Disp: , Rfl:    diclofenac  (VOLTAREN ) 50 MG EC tablet, TAKE 1 TABLET(50 MG) BY MOUTH DAILY (Patient not taking: Reported on 08/20/2024), Disp: 30 tablet, Rfl: 3   semaglutide -weight management (WEGOVY ) 1.7 MG/0.75ML SOAJ SQ injection, Inject 1.7 mg into the skin once a week. (Patient not taking: Reported on 08/20/2024), Disp: 3 mL, Rfl: 1  Past Medical Problems: Past Medical History:  Diagnosis Date   Allergic rhinitis, seasonal    COPD (chronic obstructive pulmonary disease) (HCC)    GAD (generalized anxiety disorder)    GERD (gastroesophageal reflux disease)    History of adenomatous polyp of colon    Hyperlipidemia, mixed    Hypertension    MDD (major depressive disorder)    Mixed stress and urge urinary incontinence    OA (osteoarthritis)    OAB (overactive bladder)    Tobacco user 12/07/2023   Wears glasses     Past Surgical History: Past Surgical History:  Procedure Laterality Date   ANTERIOR CERVICAL DECOMP/DISCECTOMY FUSION  02/14/2007   @[facility]  by dr pool   BILATERAL CARPAL TUNNLE RELEASE Bilateral    1990s   BLADDER SUSPENSION N/A 02/14/2023   Procedure: TRANSVAGINAL TAPE (TVT) PROCEDURE;  Surgeon: Marilynne Rosaline SAILOR, MD;  Location: [facility];  Service: Gynecology;  Laterality: N/A;  Total time requested is 1 hour   CERVICAL SPINE SURGERY  04/28/2010   @[facility]  by dr pool;   RE-EXPLORATION C5-6 FUSION /  REMOVAL HARDWARE AND ACDF C6-7   CHOLECYSTECTOMY OPEN  1981   CYSTOSCOPY N/A 02/14/2023   Procedure: CYSTOSCOPY;  Surgeon: Marilynne Rosaline SAILOR, MD;  Location: [facility];  Service: Gynecology;  Laterality: N/A;   DEBRIDEMENT TENNIS ELBOW Right    1990s   KNEE CLOSED REDUCTION Right 08/02/2018   Procedure: CLOSED MANIPULATION RIGHT TOTAL KNEE;  Surgeon: Heide Ingle, MD;  Location: [facility];  Service: Orthopedics;  Laterality: Right;    LAPAROSCOPIC ASSISTED VAGINAL HYSTERECTOMY  2003   TONSILLECTOMY     child   TOTAL KNEE ARTHROPLASTY Right 05/17/2018   Procedure:  RIGHT TOTAL KNEE ARTHROPLASTY, INSERTION OF BONE GRAFT IN FEMORAL CANAL;  Surgeon: Heide Ingle, MD;  Location: [facility];  Service: Orthopedics;  Laterality: Right;   TUBAL LIGATION Bilateral    yrs ago    Social History: Social History   Socioeconomic History   Marital status: Married    Spouse name: Not on file   Number of children: Not on file   Years of education: Not on file   Highest education level: Not on file  Occupational History   Not on file  Tobacco Use   Smoking status: Former    Current packs/day: 0.00    Average packs/day: 1 pack/day for 30.0 years (30.0 ttl pk-yrs)    Types: Cigarettes    Quit date: 06/25/2023    Years since quitting: 1.1   Smokeless tobacco: Never  Vaping Use   Vaping status: Some Days   Substances: Nicotine, Flavoring   Devices: buse  Substance and Sexual Activity   Alcohol use: Yes    Comment: RARE   Drug use: Never   Sexual activity: Yes    Partners: Male    Birth control/protection: Surgical  Other Topics Concern   Not on file  Social History Narrative   Not on file   Social Drivers of Health   Financial Resource Strain: Low Risk  (07/27/2022)   Overall Financial Resource Strain (CARDIA)    Difficulty of Paying Living Expenses: Not hard at all  Food Insecurity: No Food Insecurity (07/27/2022)   Hunger Vital Sign    Worried About Running  Out of Food in the Last Year: Never true    Ran Out of Food in the Last Year: Never true  Transportation Needs: No Transportation Needs (07/27/2022)   PRAPARE - Administrator, Civil Service (Medical): No    Lack of Transportation (Non-Medical): No  Physical Activity: Inactive (07/27/2022)   Exercise Vital Sign    Days of Exercise per Week: 0 days    Minutes of Exercise per Session: 0 min  Stress: No Stress Concern Present (07/27/2022)   [employer] of Occupational Health - Occupational Stress Questionnaire    Feeling of Stress : Not at all  Social Connections: Moderately Integrated (07/27/2022)   Social Connection and Isolation Panel    Frequency of Communication with Friends and Family: More than three times a week    Frequency of Social Gatherings with Friends and Family: More than three times a week    Attends Religious Services: 1 to 4 times per year    Active Member of [employer] or Organizations: No    Attends Banker Meetings: Never    Marital Status: Married  Catering manager Violence: Not At Risk (07/27/2022)   Humiliation, Afraid, Rape, and Kick questionnaire    Fear of Current or Ex-Partner: No    Emotionally Abused: No    Physically Abused: No    Sexually Abused: No    Family History: Family History  Problem Relation Age of Onset   Cancer Mother    Hypertension Mother    Hyperlipidemia Mother    Rectal cancer Mother    Hypertension Father    Hyperlipidemia Father    Heart Problems Father    Arthritis Father    COPD Father    Hypertension Brother    Hypertension Paternal Aunt    Heart Problems Paternal Aunt    Hypertension Paternal Aunt    Hyperlipidemia Paternal Aunt    Hypertension Paternal Aunt    Hyperlipidemia  Paternal Aunt    Skin cancer Paternal Aunt    Heart Problems Paternal Aunt    Breast cancer Maternal Grandmother    Hypertension Paternal Grandmother    Heart Problems Paternal Grandmother    Hypertension Paternal  Grandfather    Stroke Paternal Grandfather    Hyperlipidemia Paternal Grandfather    Colon cancer Neg Hx    Colon polyps Neg Hx    Esophageal cancer Neg Hx    Stomach cancer Neg Hx     Review of Systems: Denies any recent fevers, chills or changes in her health  Physical Exam: Vital Signs BP 136/81 (BP Location: Left Arm, Patient Position: Sitting, Cuff Size: Large)   Pulse 88   Wt 188 lb (85.3 kg)   SpO2 100%   BMI 30.34 kg/m   Physical Exam  Constitutional:      General: Not in acute distress.    Appearance: Normal appearance. Not ill-appearing.  HENT:     Head: Normocephalic and atraumatic.  Neck:     Musculoskeletal: Normal range of motion.  Cardiovascular:     Rate and Rhythm: Normal rate Pulmonary:     Effort: Pulmonary effort is normal. No respiratory distress.  Musculoskeletal: Normal range of motion.  Skin:    General: Skin is warm and dry.     Findings: No erythema or rash.  Neurological:     Mental Status: Alert and oriented to person, place, and time. Mental status is at baseline.  Psychiatric:        Mood and Affect: Mood normal.        Behavior: Behavior normal.    Assessment/Plan: The patient is scheduled for excision of forehead/skull mass with Dr. Lowery.  Risks, benefits, and alternatives of procedure discussed, questions answered and consent obtained.    Will send clearance to patient's PCP.  Smoking Status: Quit 14 months ago, counseling Given?  Discussed with her the importance of continuing smoking cessation as smoking can inhibit wound healing.  Patient expressed understanding.  Caprini Score: 6; Risk Factors include: Age, BMI > 25, hormone replacement, COPD and length of planned surgery. Recommendation for mechanical prophylaxis. Encourage early ambulation.   Pictures obtained: @consult   Post-op Rx sent to pharmacy: Oxycodone , Zofran , Keflex   Instructed the patient to hold estradiol  2 weeks before and 2 weeks after surgery.  We  discussed the risks of blood clots with estrogen therapy.  Patient expressed understanding and states that she will be able to hold this medication.  Discussed with the patient to hold her losartan  and hydrochlorothiazide  the day of surgery.  Patient states she is no longer taking Wegovy  and no longer taking diclofenac .  Instructed patient to hold any multivitamins or supplements at least 1 week prior to surgery.  Understanding.  Patient was provided with the General Surgical Risk consent document and Pain Medication Agreement prior to their appointment.  They had adequate time to read through the risk consent documents and Pain Medication Agreement. We also discussed them in person together during this preop appointment. All of their questions were answered to their satisfaction.  Recommended calling if they have any further questions.  Risk consent form and Pain Medication Agreement to be scanned into patient's chart.  The consent was obtained with risks and complications reviewed which included bleeding, pain, scar, infection and the risk of anesthesia.  The patients questions were answered to the patients expressed satisfaction.   Discussed with the patient that her history of COPD may put her at increased risk  of issues with anesthesia.  Patient expressed understanding.   Electronically signed by: Estefana FORBES Peck, PA-C 08/20/2024 4:39 PM

## 2024-08-21 ENCOUNTER — Telehealth: Payer: Self-pay

## 2024-08-21 NOTE — Telephone Encounter (Signed)
 Surgical clearance sent to PCP [facility] [phone]). Confirmation of receipt.

## 2024-08-29 ENCOUNTER — Encounter (HOSPITAL_BASED_OUTPATIENT_CLINIC_OR_DEPARTMENT_OTHER): Payer: Self-pay | Admitting: Plastic Surgery

## 2024-09-01 ENCOUNTER — Other Ambulatory Visit: Payer: Self-pay | Admitting: Physician Assistant

## 2024-09-01 DIAGNOSIS — E876 Hypokalemia: Secondary | ICD-10-CM

## 2024-09-03 ENCOUNTER — Encounter (HOSPITAL_BASED_OUTPATIENT_CLINIC_OR_DEPARTMENT_OTHER)
Admission: RE | Admit: 2024-09-03 | Discharge: 2024-09-03 | Disposition: A | Source: Ambulatory Visit | Attending: Plastic Surgery

## 2024-09-03 DIAGNOSIS — Z01818 Encounter for other preprocedural examination: Secondary | ICD-10-CM | POA: Diagnosis present

## 2024-09-03 LAB — BASIC METABOLIC PANEL WITH GFR
Anion gap: 12 (ref 5–15)
BUN: 10 mg/dL (ref 6–20)
CO2: 27 mmol/L (ref 22–32)
Calcium: 9.3 mg/dL (ref 8.9–10.3)
Chloride: 101 mmol/L (ref 98–111)
Creatinine, Ser: 0.71 mg/dL (ref 0.44–1.00)
GFR, Estimated: >60 mL/min (ref >60)
Glucose, Bld: 103 mg/dL — ABNORMAL HIGH (ref 70–99)
Potassium: 3.6 mmol/L (ref 3.5–5.1)
Sodium: 140 mmol/L (ref 135–145)

## 2024-09-06 ENCOUNTER — Encounter (HOSPITAL_BASED_OUTPATIENT_CLINIC_OR_DEPARTMENT_OTHER)
Admission: RE | Disposition: A | Discharge status: Home / Self Care | Payer: Self-pay | Source: Home / Self Care | Attending: Plastic Surgery

## 2024-09-06 ENCOUNTER — Encounter (HOSPITAL_BASED_OUTPATIENT_CLINIC_OR_DEPARTMENT_OTHER): Payer: Self-pay | Admitting: Plastic Surgery

## 2024-09-06 ENCOUNTER — Ambulatory Visit (HOSPITAL_BASED_OUTPATIENT_CLINIC_OR_DEPARTMENT_OTHER)
Admission: RE | Admit: 2024-09-06 | Discharge: 2024-09-06 | Disposition: A | Discharge status: Home / Self Care | Attending: Plastic Surgery | Admitting: Plastic Surgery

## 2024-09-06 ENCOUNTER — Ambulatory Visit (HOSPITAL_BASED_OUTPATIENT_CLINIC_OR_DEPARTMENT_OTHER): Admitting: Anesthesiology

## 2024-09-06 ENCOUNTER — Other Ambulatory Visit: Payer: Self-pay

## 2024-09-06 DIAGNOSIS — F32A Depression, unspecified: Secondary | ICD-10-CM | POA: Insufficient documentation

## 2024-09-06 DIAGNOSIS — J449 Chronic obstructive pulmonary disease, unspecified: Secondary | ICD-10-CM | POA: Diagnosis not present

## 2024-09-06 DIAGNOSIS — F411 Generalized anxiety disorder: Secondary | ICD-10-CM | POA: Diagnosis not present

## 2024-09-06 DIAGNOSIS — R220 Localized swelling, mass and lump, head: Secondary | ICD-10-CM | POA: Diagnosis not present

## 2024-09-06 DIAGNOSIS — D164 Benign neoplasm of bones of skull and face: Secondary | ICD-10-CM | POA: Insufficient documentation

## 2024-09-06 DIAGNOSIS — M898X8 Other specified disorders of bone, other site: Secondary | ICD-10-CM | POA: Diagnosis present

## 2024-09-06 DIAGNOSIS — K219 Gastro-esophageal reflux disease without esophagitis: Secondary | ICD-10-CM | POA: Diagnosis not present

## 2024-09-06 DIAGNOSIS — Z79890 Hormone replacement therapy (postmenopausal): Secondary | ICD-10-CM | POA: Insufficient documentation

## 2024-09-06 DIAGNOSIS — Z01818 Encounter for other preprocedural examination: Secondary | ICD-10-CM

## 2024-09-06 HISTORY — PX: EXCISION MASS HEAD: SHX6702

## 2024-09-06 SURGERY — EXCISION, MASS, HEAD
Anesthesia: General | Site: Head

## 2024-09-06 MED ORDER — BUPIVACAINE HCL 0.25 % IJ SOLN
INTRAMUSCULAR | Status: DC | PRN
  Administered 2024-09-06: .5 mL via INTRAMUSCULAR

## 2024-09-06 MED ORDER — NITROGLYCERIN 2 % TD OINT
TOPICAL_OINTMENT | TRANSDERMAL | Status: AC
Start: 2024-09-06 — End: 2024-09-06
  Filled 2024-09-06: qty 30

## 2024-09-06 MED ORDER — LACTATED RINGERS IV SOLN: INTRAVENOUS | Status: DC

## 2024-09-06 MED ORDER — SODIUM CHLORIDE 0.9% FLUSH: 3.0000 mL | Freq: Two times a day (BID) | INTRAVENOUS | Status: DC

## 2024-09-06 MED ORDER — ACETAMINOPHEN 325 MG PO TABS
650.0000 mg | ORAL_TABLET | ORAL | Status: DC | PRN
Start: 2024-09-06 — End: 2024-09-06

## 2024-09-06 MED ORDER — BUPIVACAINE LIPOSOME 1.3 % IJ SUSP
INTRAMUSCULAR | Status: AC
Start: 2024-09-06 — End: 2024-09-06
  Filled 2024-09-06: qty 40

## 2024-09-06 MED ORDER — OXYCODONE HCL 5 MG/5ML PO SOLN: 5.0000 mg | Freq: Once | ORAL | Status: DC | PRN

## 2024-09-06 MED ORDER — ONDANSETRON HCL 4 MG/2ML IJ SOLN
INTRAMUSCULAR | Status: AC
  Filled 2024-09-06: qty 2

## 2024-09-06 MED ORDER — MIDAZOLAM HCL 2 MG/2ML IJ SOLN
INTRAMUSCULAR | Status: AC
  Filled 2024-09-06: qty 2

## 2024-09-06 MED ORDER — CEFAZOLIN SODIUM-DEXTROSE 2-4 GM/100ML-% IV SOLN
2.0000 g | INTRAVENOUS | Status: AC
  Administered 2024-09-06: 2 g via INTRAVENOUS

## 2024-09-06 MED ORDER — FENTANYL CITRATE (PF) 100 MCG/2ML IJ SOLN
INTRAMUSCULAR | Status: DC | PRN
  Administered 2024-09-06: 50 ug via INTRAVENOUS

## 2024-09-06 MED ORDER — DEXAMETHASONE SOD PHOSPHATE PF 10 MG/ML IJ SOLN
INTRAMUSCULAR | Status: DC | PRN
  Administered 2024-09-06: 5 mg via INTRAVENOUS

## 2024-09-06 MED ORDER — ONDANSETRON HCL 4 MG/2ML IJ SOLN
INTRAMUSCULAR | Status: DC | PRN
Start: 2024-09-06 — End: 2024-09-06
  Administered 2024-09-06: 4 mg via INTRAVENOUS

## 2024-09-06 MED ORDER — FENTANYL CITRATE (PF) 100 MCG/2ML IJ SOLN: 25.0000 ug | INTRAMUSCULAR | Status: DC | PRN

## 2024-09-06 MED ORDER — ACETAMINOPHEN 325 MG RE SUPP: 650.0000 mg | RECTAL | Status: DC | PRN

## 2024-09-06 MED ORDER — BUPIVACAINE HCL (PF) 0.25 % IJ SOLN
INTRAMUSCULAR | Status: AC
Start: 2024-09-06 — End: 2024-09-06
  Filled 2024-09-06: qty 150

## 2024-09-06 MED ORDER — MIDAZOLAM HCL (PF) 2 MG/2ML IJ SOLN
INTRAMUSCULAR | Status: DC | PRN
  Administered 2024-09-06 (×2): 1 mg via INTRAVENOUS

## 2024-09-06 MED ORDER — OXYCODONE HCL 5 MG PO TABS: 5.0000 mg | ORAL_TABLET | ORAL | Status: DC | PRN

## 2024-09-06 MED ORDER — SODIUM CHLORIDE 0.9% FLUSH: 3.0000 mL | INTRAVENOUS | Status: DC | PRN

## 2024-09-06 MED ORDER — PROPOFOL 10 MG/ML IV BOLUS
INTRAVENOUS | Status: DC | PRN
  Administered 2024-09-06: 160 mg via INTRAVENOUS
  Administered 2024-09-06: 40 mg via INTRAVENOUS

## 2024-09-06 MED ORDER — DROPERIDOL 2.5 MG/ML IJ SOLN: 0.6250 mg | Freq: Once | INTRAMUSCULAR | Status: DC | PRN

## 2024-09-06 MED ORDER — LIDOCAINE 2% (20 MG/ML) 5 ML SYRINGE
INTRAMUSCULAR | Status: DC | PRN
  Administered 2024-09-06: 80 mg via INTRAVENOUS

## 2024-09-06 MED ORDER — CHLORHEXIDINE GLUCONATE CLOTH 2 % EX PADS: 6.0000 | MEDICATED_PAD | Freq: Once | CUTANEOUS | Status: DC

## 2024-09-06 MED ORDER — ACETAMINOPHEN 500 MG PO TABS
ORAL_TABLET | ORAL | Status: AC
  Filled 2024-09-06: qty 2

## 2024-09-06 MED ORDER — FENTANYL CITRATE (PF) 100 MCG/2ML IJ SOLN
INTRAMUSCULAR | Status: AC
  Filled 2024-09-06: qty 2

## 2024-09-06 MED ORDER — LIDOCAINE 2% (20 MG/ML) 5 ML SYRINGE
INTRAMUSCULAR | Status: AC
  Filled 2024-09-06: qty 5

## 2024-09-06 MED ORDER — ACETAMINOPHEN 500 MG PO TABS
1000.0000 mg | ORAL_TABLET | Freq: Once | ORAL | Status: AC
  Administered 2024-09-06: 1000 mg via ORAL

## 2024-09-06 MED ORDER — 0.9 % SODIUM CHLORIDE (POUR BTL) OPTIME
TOPICAL | Status: DC | PRN
  Administered 2024-09-06: 1000 mL

## 2024-09-06 MED ORDER — CEFAZOLIN SODIUM-DEXTROSE 2-4 GM/100ML-% IV SOLN
INTRAVENOUS | Status: AC
  Filled 2024-09-06: qty 100

## 2024-09-06 MED ORDER — LIDOCAINE HCL (PF) 1 % IJ SOLN
INTRAMUSCULAR | Status: AC
  Filled 2024-09-06: qty 120

## 2024-09-06 MED ORDER — OXYCODONE HCL 5 MG PO TABS: 5.0000 mg | ORAL_TABLET | Freq: Once | ORAL | Status: DC | PRN

## 2024-09-06 MED ORDER — EPINEPHRINE PF 1 MG/ML IJ SOLN
INTRAMUSCULAR | Status: AC
Start: 2024-09-06 — End: 2024-09-06
  Filled 2024-09-06: qty 2

## 2024-09-06 MED ORDER — SODIUM CHLORIDE 0.9 % IV SOLN: 250.0000 mL | INTRAVENOUS | Status: DC | PRN

## 2024-09-06 SURGICAL SUPPLY — 47 items
BLADE CLIPPER SURG (BLADE) IMPLANT
BLADE SURG 15 STRL LF DISP TIS (BLADE) ×1 IMPLANT
BNDG ELASTIC 2INX 5YD STR LF (GAUZE/BANDAGES/DRESSINGS) IMPLANT
CANISTER SUCT 1200ML W/VALVE (MISCELLANEOUS) IMPLANT
CORD BIPOLAR FORCEPS 12FT (ELECTRODE) IMPLANT
COVER BACK TABLE 60X90IN (DRAPES) ×1 IMPLANT
COVER MAYO STAND STRL (DRAPES) ×1 IMPLANT
DERMABOND ADVANCED .7 DNX12 (GAUZE/BANDAGES/DRESSINGS) IMPLANT
DRAPE LAPAROTOMY 100X72 PEDS (DRAPES) IMPLANT
DRAPE U-SHAPE 76X120 STRL (DRAPES) IMPLANT
DRSG TEGADERM 2-3/8X2-3/4 SM (GAUZE/BANDAGES/DRESSINGS) IMPLANT
ELECT NDL BLADE 2-5/6 (NEEDLE) ×1 IMPLANT
ELECT NEEDLE BLADE 2-5/6 (NEEDLE) ×1 IMPLANT
ELECTRODE REM PT RETRN 9FT PED (ELECTROSURGICAL) IMPLANT
ELECTRODE REM PT RTRN 9FT ADLT (ELECTROSURGICAL) IMPLANT
GAUZE SPONGE 2X2 STRL 8-PLY (GAUZE/BANDAGES/DRESSINGS) IMPLANT
GAUZE SPONGE 4X4 12PLY STRL LF (GAUZE/BANDAGES/DRESSINGS) IMPLANT
GAUZE STRETCH 2X75IN STRL (MISCELLANEOUS) IMPLANT
GAUZE XEROFORM 1X8 LF (GAUZE/BANDAGES/DRESSINGS) IMPLANT
GLOVE BIO SURGEON STRL SZ 6.5 (GLOVE) ×2 IMPLANT
GOWN STRL REUS W/ TWL LRG LVL3 (GOWN DISPOSABLE) ×2 IMPLANT
NDL HYPO 30GX1 BEV (NEEDLE) ×1 IMPLANT
NDL PRECISIONGLIDE 27X1.5 (NEEDLE) IMPLANT
NEEDLE HYPO 30GX1 BEV (NEEDLE) IMPLANT
NEEDLE PRECISIONGLIDE 27X1.5 (NEEDLE) ×1 IMPLANT
PACK BASIN DAY SURGERY FS (CUSTOM PROCEDURE TRAY) ×1 IMPLANT
PENCIL SMOKE EVACUATOR (MISCELLANEOUS) IMPLANT
SHEET MEDIUM DRAPE 40X70 STRL (DRAPES) IMPLANT
SOLN 0.9% NACL POUR BTL 1000ML (IV SOLUTION) IMPLANT
SPIKE FLUID TRANSFER (MISCELLANEOUS) ×1 IMPLANT
STRIP CLOSURE SKIN 1/2X4 (GAUZE/BANDAGES/DRESSINGS) IMPLANT
SUCTION TUBE FRAZIER 10FR DISP (SUCTIONS) IMPLANT
SUT MNCRL 6-0 UNDY P1 1X18 (SUTURE) IMPLANT
SUT MNCRL AB 4-0 PS2 18 (SUTURE) IMPLANT
SUT MON AB 5-0 P3 18 (SUTURE) IMPLANT
SUT MON AB 5-0 PS2 18 (SUTURE) IMPLANT
SUT PROLENE 5 0 P 3 (SUTURE) IMPLANT
SUT PROLENE 5 0 PS 2 (SUTURE) IMPLANT
SUT PROLENE 6 0 P 1 18 (SUTURE) IMPLANT
SUT VIC AB 4-0 PS2 18 (SUTURE) IMPLANT
SUT VIC AB 5-0 P-3 18X BRD (SUTURE) IMPLANT
SUT VIC AB 5-0 PS2 18 (SUTURE) IMPLANT
SYR BULB EAR ULCER 3OZ GRN STR (SYRINGE) IMPLANT
SYR CONTROL 10ML LL (SYRINGE) ×1 IMPLANT
TOWEL GREEN STERILE FF (TOWEL DISPOSABLE) ×1 IMPLANT
TRAY DSU PREP LF (CUSTOM PROCEDURE TRAY) ×1 IMPLANT
TUBE CONNECTING 20X1/4 (TUBING) IMPLANT

## 2024-09-06 NOTE — Transfer of Care (Signed)
 Immediate Anesthesia Transfer of Care Note  Patient: MARKERIA GOETSCH  Procedure(s) Performed: EXCISION, MASS, HEAD (Head)  Patient Location: PACU  Anesthesia Type:General  Level of Consciousness: awake  Airway & Oxygen Therapy: Patient Spontanous Breathing  Post-op Assessment: Report given to RN and Post -op Vital signs reviewed and stable  Post vital signs: Reviewed and stable  Last Vitals:  Vitals Value Taken Time  BP 144/73 09/06/24 11:38  Temp    Pulse 74 09/06/24 11:40  Resp 12 09/06/24 11:40  SpO2 94 % 09/06/24 11:40  Vitals shown include unfiled device data.  Last Pain:  Vitals:   09/06/24 1026  TempSrc: Temporal  PainSc: 0-No pain      Patients Stated Pain Goal: 4 (09/06/24 1026)  Complications: No notable events documented.

## 2024-09-06 NOTE — Anesthesia Postprocedure Evaluation (Signed)
 Anesthesia Post Note  Patient: Colleen Larsen  Procedure(s) Performed: EXCISION, MASS, HEAD (Head)     Patient location during evaluation: PACU Anesthesia Type: General Level of consciousness: awake and alert Pain management: pain level controlled Vital Signs Assessment: post-procedure vital signs reviewed and stable Respiratory status: spontaneous breathing, nonlabored ventilation, respiratory function stable and patient connected to nasal cannula oxygen Cardiovascular status: blood pressure returned to baseline and stable Postop Assessment: no apparent nausea or vomiting Anesthetic complications: no   No notable events documented.  Last Vitals:  Vitals:   09/06/24 1200 09/06/24 1219  BP: (!) 143/87 (!) 157/89  Pulse: 69 74  Resp: 11 20  Temp:  36.5 C  SpO2: 94% 94%    Last Pain:  Vitals:   09/06/24 1219  TempSrc: Temporal  PainSc: 0-No pain                 Rome Ade

## 2024-09-06 NOTE — Interval H&P Note (Signed)
 History and Physical Interval Note:  09/06/2024 10:34 AM  Colleen Larsen Finder  has presented today for surgery, with the diagnosis of skull mass.  The various methods of treatment have been discussed with the patient and family. After consideration of risks, benefits and other options for treatment, the patient has consented to  Procedure(s) with comments: EXCISION, MASS, HEAD (N/A) - Excision of forehead /skull mass as a surgical intervention.  The patient's history has been reviewed, patient examined, no change in status, stable for surgery.  I have reviewed the patient's chart and labs.  Questions were answered to the patient's satisfaction.     Estefana RAMAN Garrett Mitchum

## 2024-09-06 NOTE — Anesthesia Preprocedure Evaluation (Signed)
 Anesthesia Evaluation  Patient identified by MRN, date of birth, ID band Patient awake    Reviewed: Allergy & Precautions, NPO status , Patient's Chart, lab work & pertinent test results  History of Anesthesia Complications Negative for: history of anesthetic complications  Airway Mallampati: II  TM Distance: >3 FB Neck ROM: Full    Dental no notable dental hx. (+) Teeth Intact   Pulmonary neg sleep apnea, COPD, Patient abstained from smoking.Not current smoker, former smoker   Pulmonary exam normal breath sounds clear to auscultation       Cardiovascular Exercise Tolerance: Good METShypertension, Pt. on medications (-) CAD and (-) Past MI (-) dysrhythmias  Rhythm:Regular Rate:Normal - Systolic murmurs    Neuro/Psych  PSYCHIATRIC DISORDERS Anxiety Depression    negative neurological ROS     GI/Hepatic ,GERD  Medicated and Controlled,,(+)     (-) substance abuse    Endo/Other  neg diabetes  Off of GLP1 since 2-3 weeks ago. Denies GI symptoms today  Renal/GU negative Renal ROS     Musculoskeletal   Abdominal  (+) + obese  Peds  Hematology   Anesthesia Other Findings Past Medical History: No date: Allergic rhinitis, seasonal No date: COPD (chronic obstructive pulmonary disease) (HCC) No date: GAD (generalized anxiety disorder) No date: GERD (gastroesophageal reflux disease) No date: History of adenomatous polyp of colon No date: Hyperlipidemia, mixed No date: Hypertension No date: MDD (major depressive disorder) No date: Mixed stress and urge urinary incontinence No date: OA (osteoarthritis) No date: OAB (overactive bladder) 12/07/2023: Tobacco user No date: Wears glasses  Reproductive/Obstetrics                              Anesthesia Physical Anesthesia Plan  ASA: 2  Anesthesia Plan: General   Post-op Pain Management: Tylenol  PO (pre-op)*   Induction: Intravenous  PONV  Risk Score and Plan: 3 and Ondansetron , Dexamethasone  and Midazolam   Airway Management Planned: LMA  Additional Equipment: None  Intra-op Plan:   Post-operative Plan: Extubation in OR  Informed Consent: I have reviewed the patients History and Physical, chart, labs and discussed the procedure including the risks, benefits and alternatives for the proposed anesthesia with the patient or authorized representative who has indicated his/her understanding and acceptance.     Dental advisory given  Plan Discussed with: CRNA and Surgeon  Anesthesia Plan Comments: (Discussed risks of anesthesia with patient, including PONV, sore throat, lip/dental/eye damage. Rare risks discussed as well, such as cardiorespiratory and neurological sequelae, and allergic reactions. Discussed the role of CRNA in patient's perioperative care. Patient understands.)        Anesthesia Quick Evaluation

## 2024-09-06 NOTE — Anesthesia Procedure Notes (Signed)
 Procedure Name: LMA Insertion Date/Time: 09/06/2024 11:15 AM  Performed by: Delayne Olam BIRCH, CRNAPre-anesthesia Checklist: Patient identified, Emergency Drugs available, Suction available and Patient being monitored Patient Re-evaluated:Patient Re-evaluated prior to induction Oxygen Delivery Method: Circle system utilized Preoxygenation: Pre-oxygenation with 100% oxygen Induction Type: IV induction Ventilation: Mask ventilation without difficulty LMA: LMA inserted LMA Size: 4.0 Number of attempts: 1 Airway Equipment and Method: Bite block Placement Confirmation: positive ETCO2 Tube secured with: Tape Dental Injury: Teeth and Oropharynx as per pre-operative assessment

## 2024-09-06 NOTE — Op Note (Signed)
 DATE OF OPERATION: 09/06/2024  LOCATION: [facility]  PREOPERATIVE DIAGNOSIS: forehead/scalp bone mass  POSTOPERATIVE DIAGNOSIS: Same  PROCEDURE: Excision of forehead/scalp bone mass 1 cm  SURGEON: Estefana Fritter, DO  ASSISTANT: Estefana Peck, PA  EBL: none  CONDITION: Stable  COMPLICATIONS: None  INDICATION: The patient, Colleen Larsen, is a 58 y.o. female born on [birth year], is here for treatment of a bone mass of the midline scalp.   PROCEDURE DETAILS:  The patient was seen prior to surgery and marked.  The IV antibiotics were given. The patient was taken to the operating room and given a general anesthetic. A standard time out was performed and all information was confirmed by those in the room. SCDs were placed.   The scalp was injected with local.  The #15 blade was used to make an incision vertically.  The bone mass was located and released from the skull with the osteotome.  It was sent to pathology.  The incision was closed with the 6-0 Monocryl.  Derma bond was applied.  The patient was allowed to wake up and taken to recovery room in stable condition at the end of the case. The family was notified at the end of the case.   The advanced practice practitioner (APP) assisted throughout the case.  The APP was essential in retraction and counter traction when needed to make the case progress smoothly.  This retraction and assistance made it possible to see the tissue plans for the procedure.  The assistance was needed for blood control, tissue re-approximation and assisted with closure of the incision site.

## 2024-09-06 NOTE — Discharge Instructions (Addendum)
 INSTRUCTIONS FOR AFTER SURGERY   You will likely have some questions about what to expect following your operation.  The following information will help you and your family understand what to expect when you are discharged from the hospital.  It is important to follow these guidelines to help ensure a smooth recovery and reduce complication.  Postoperative instructions include information on: diet, wound care, medications and physical activity.  AFTER SURGERY Expect to go home after the procedure.  In some cases, you may need to spend one night in the hospital for observation.  DIET Surgery does not require a specific diet.  However, the healthier you eat the better your body will heal. It is important to increasing your protein intake.  This means limiting the foods with sugar and carbohydrates.  Focus on vegetables and some meat.  If you have liposuction during your procedure be sure to drink water.  If your urine is bright yellow, then it is concentrated, and you need to drink more water.  As a general rule after surgery, you should have 8 ounces of water every hour while awake.  If you find you are persistently nauseated or unable to take in liquids let us  know.  NO TOBACCO USE or EXPOSURE.  This will slow your healing process and lead to a wound.  WOUND CARE If you have steri-strips / tape directly attached to your skin leave them in place. It is OK to get these wet.   We close your incision to leave the smallest and best-looking scar. No ointment or creams on your incisions for four weeks.  No Neosporin (Too many skin reactions).  A few weeks after surgery you can use Mederma and start massaging the scar. We ask you to wear your binder or sports bra for the first 6 weeks around the clock, including while sleeping. This provides added comfort and helps reduce the fluid accumulation at the surgery site. Ice is fine to use.  ACTIVITY No heavy lifting until cleared by the doctor.  This usually  means no more than a half-gallon of milk.  It is OK to walk and climb stairs. Moving your legs is very important to decrease your risk of a blood clot.  It will also help keep you from getting deconditioned.  Every 1 to 2 hours get up and walk for 5 minutes. This will help with a quicker recovery back to normal.  Let pain be your guide so you don't do too much.  This time is for you to recover.  You will be more comfortable if you sleep and rest with your head elevated either with a few pillows under you or in a recliner.  No stomach sleeping for a one month.  WORK Everyone returns to work at different times. As a rough guide, most people take at least 1 - 2 weeks off prior to returning to work. If you need documentation for your job, give the forms to the front staff at the clinic.  DRIVING Arrange for someone to bring you home from the hospital after your surgery.  You may be able to drive a few days after surgery but not while taking any narcotics or valium.  BOWEL MOVEMENTS Constipation can occur after anesthesia and while taking pain medication.  It is important to stay ahead for your comfort.  We recommend taking Milk of Magnesia (2 tablespoons; twice a day) while taking the pain pills.  MEDICATIONS You may be prescribed should start after surgery At your  preoperative visit for you history and physical you were given the following medications: Antibiotic: Start this medication when you get home and take according to the instructions on the bottle. Zofran  4 mg:  This is to treat nausea and vomiting.  You can take this every 6 hours as needed and only if needed. Oxycodone  5 mg every 6 hours for 3 - 5 days.  This is to be used after you have taken the Motrin  or the Tylenol .  Over the counter Medication to take: Ibuprofen  (Motrin ) 400 - 600 mg every 6 hour for 7 days Tylenol  500 mg every 6 hours for 7 days.  Only take the Oxycodone  after you have tried these two. MiraLAX  or stool softener of  choice: Take this according to the bottle if you take the Norco.  WHEN TO CALL Call your surgeon's office if any of the following occur: Fever 101 degrees F or greater Excessive bleeding or fluid from the incision site. Pain that increases over time without aid from the medications Redness, warmth, or pus draining from incision sites Persistent nausea or inability to take in liquids Severe misshapen area that underwent the operation.   No Tylenol  before 4:30pm.   Post Anesthesia Home Care Instructions  Activity: Get plenty of rest for the remainder of the day. A responsible individual must stay with you for 24 hours following the procedure.  For the next 24 hours, DO NOT: -Drive a car -Advertising copywriter -Drink alcoholic beverages -Take any medication unless instructed by your physician -Make any legal decisions or sign important papers.  Meals: Start with liquid foods such as gelatin or soup. Progress to regular foods as tolerated. Avoid greasy, spicy, heavy foods. If nausea and/or vomiting occur, drink only clear liquids until the nausea and/or vomiting subsides. Call your physician if vomiting continues.  Special Instructions/Symptoms: Your throat may feel dry or sore from the anesthesia or the breathing tube placed in your throat during surgery. If this causes discomfort, gargle with warm salt water. The discomfort should disappear within 24 hours.

## 2024-09-07 ENCOUNTER — Encounter (HOSPITAL_BASED_OUTPATIENT_CLINIC_OR_DEPARTMENT_OTHER): Payer: Self-pay | Admitting: Plastic Surgery

## 2024-09-11 ENCOUNTER — Ambulatory Visit: Admitting: Plastic Surgery

## 2024-09-11 VITALS — BP 146/86 | HR 85

## 2024-09-11 DIAGNOSIS — M898X8 Other specified disorders of bone, other site: Secondary | ICD-10-CM

## 2024-09-11 DIAGNOSIS — Z9889 Other specified postprocedural states: Secondary | ICD-10-CM

## 2024-09-11 LAB — SURGICAL PATHOLOGY

## 2024-09-11 NOTE — Progress Notes (Signed)
 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  Her forehead has a little bit of bruising and swelling but she did not get black eyes.  Overall she is doing really well.  She said she felt a little lightheaded now and again.  I am going to have her increase her water intake.  Let us  know if there is any change or if it does not improve.  Will plan to see her in 2 weeks to get the stitches out.  Pictures in 2 weeks.

## 2024-09-12 ENCOUNTER — Ambulatory Visit: Admitting: Physician Assistant

## 2024-09-14 ENCOUNTER — Encounter: Admitting: Plastic Surgery

## 2024-09-18 ENCOUNTER — Telehealth: Payer: Self-pay

## 2024-09-18 NOTE — Telephone Encounter (Signed)
 Copied from [id number]. Topic: Clinical - Medical Advice >> Sep 18, 2024 10:56 AM Dedra B wrote: Reason for CRM: Pt wants to schedule her total knee replacement surgery, but it can't be scheduled until she has clearance. Pt said she had clearance for another surgery two weeks ago and wants to know if that can be used or if she needs to schedule an appt for a new clearance. Pls call pt and let her know.

## 2024-09-25 ENCOUNTER — Encounter: Admitting: Student

## 2024-09-25 NOTE — Telephone Encounter (Signed)
 Tried calling patient. Could not reach patient at this time, will try again later.

## 2024-09-26 ENCOUNTER — Encounter: Admitting: Surgical

## 2024-09-28 ENCOUNTER — Ambulatory Visit (INDEPENDENT_AMBULATORY_CARE_PROVIDER_SITE_OTHER): Admitting: Physician Assistant

## 2024-09-28 DIAGNOSIS — D169 Benign neoplasm of bone and articular cartilage, unspecified: Secondary | ICD-10-CM

## 2024-09-28 NOTE — Progress Notes (Signed)
 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 Lowery who returns to clinic for postoperative follow-up.  She was last seen here in clinic on 09/11/2024.  At that time, she was a bit lightheaded, but otherwise doing well.  Plan for to return in 2 weeks for removal of sutures and postoperative photos.  Today, patient is doing well.  She states that the lightheadedness that she experienced at initial postoperative visit resolved shortly thereafter.  She has not been putting anything on the excision site since that visit.  On exam, there is still a moderate amount of Dermabond overlying the skin closure and involving some of her hair.  Otherwise, H[place]is looks great.  There is a single suture knot visualized at the inferior aspect of the wound that is trimmed.  Discussed debulking some of the Dermabond, but patient understands that would involve some of the overlying hair that is hardened from the glue.  This would allow us  to better visualize the excision site and remove any additional suture knots.  However, patient declines at this time.  She would prefer to simply apply a petroleum based product to help break down the Dermabond.  She understand that this will likely take a week or 2, but will perhaps follow-up with our clinic afterwards for reevaluation.  Patient understands that the Monocryl sutures used for closure are absorbable.  She is otherwise doing completely fine postoperatively with no specific concerns or complaints.  Picture(s) obtained of the patient and placed in the chart were with the patient's or guardian's permission.

## 2024-10-02 ENCOUNTER — Ambulatory Visit: Admitting: Plastic Surgery

## 2024-10-02 VITALS — BP 142/84 | HR 69

## 2024-10-02 DIAGNOSIS — M898X8 Other specified disorders of bone, other site: Secondary | ICD-10-CM

## 2024-10-02 NOTE — Progress Notes (Signed)
 The patient is a 58 year old female here for follow-up after undergoing excision of a forehead mass.  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  She has a little bit of swelling today but no sign of infection.  She has a little bruising as well.  No hematoma or seroma noted.  I took the 2 stitches out that I could see.  She can start Moderma in the next week and be careful about sun exposure.

## 2024-10-05 ENCOUNTER — Other Ambulatory Visit: Payer: Self-pay | Admitting: Physician Assistant

## 2024-10-05 DIAGNOSIS — I10 Essential (primary) hypertension: Secondary | ICD-10-CM

## 2024-10-09 ENCOUNTER — Encounter: Admitting: Student

## 2024-10-10 ENCOUNTER — Encounter: Admitting: Physician Assistant

## 2024-10-16 ENCOUNTER — Encounter: Payer: Self-pay | Admitting: Physician Assistant

## 2024-10-16 ENCOUNTER — Ambulatory Visit: Admitting: Physician Assistant

## 2024-10-16 VITALS — BP 132/72 | HR 78 | Temp 97.7°F | Ht 64.0 in | Wt 195.0 lb

## 2024-10-16 DIAGNOSIS — K219 Gastro-esophageal reflux disease without esophagitis: Secondary | ICD-10-CM

## 2024-10-16 DIAGNOSIS — F411 Generalized anxiety disorder: Secondary | ICD-10-CM

## 2024-10-16 DIAGNOSIS — I10 Essential (primary) hypertension: Secondary | ICD-10-CM

## 2024-10-16 DIAGNOSIS — E782 Mixed hyperlipidemia: Secondary | ICD-10-CM

## 2024-10-16 DIAGNOSIS — E876 Hypokalemia: Secondary | ICD-10-CM

## 2024-10-16 DIAGNOSIS — E66811 Obesity, class 1: Secondary | ICD-10-CM

## 2024-10-16 DIAGNOSIS — M1712 Unilateral primary osteoarthritis, left knee: Secondary | ICD-10-CM

## 2024-10-16 DIAGNOSIS — R7303 Prediabetes: Secondary | ICD-10-CM

## 2024-10-16 MED ORDER — ZEPBOUND 2.5 MG/0.5ML ~~LOC~~ SOAJ: 2.5000 mg | SUBCUTANEOUS | 3 refills | Status: AC

## 2024-10-16 NOTE — Progress Notes (Signed)
 Subjective:  Patient ID: Colleen Larsen, female    DOB: 19-Jan-1966  Age: 58 y.o. MRN: [id number]  Chief Complaint  Patient presents with   Medical Management of Chronic Issues    HPI: Discussed the use of AI scribe software for clinical note transcription with the patient, who gave verbal consent to proceed.  History of Present Illness Colleen Larsen is a 58 year old female who presents for a chronic follow-up visit.  She discontinued Wegovy  due to significant hair loss, which has persisted despite cessation. She has been taking 'Vital Hair' vitamins for about a month, noticing some regrowth but continued shedding. Previous thyroid evaluations were normal.  She experiences significant knee pain, described as a constant ache similar to a 'toothache' that disrupts her sleep. The pain is exacerbated by activities such as climbing stairs, and she plans her day around her knee pain. She uses a copper sleeve for support, which provides some relief. She reports being told that her knee is collapsing on the inside, which is pulling on a nerve from her hip down to her calf, and she attributes her continuous pain to this. She reports that this will be her second total knee replacement, and that the first was performed by a different surgeon.  She has a history of a surgical scar, which she reports is still swollen. She reports that she did not have much bruising after surgery, and that she was told the removed bone growth was benign.  She is currently taking potassium pills, started in August, due to low normal levels in her blood work. No abnormal heart rhythms, weakness, or fatigue.  She wants to lose weight before her upcoming knee surgery, noting that Wegovy  was not effective for weight loss.       06/07/2024    7:50 AM 12/07/2023    8:12 AM 06/27/2023    3:40 PM 11/24/2022   10:01 AM 08/19/2022    8:29 AM  Depression screen PHQ 2/9  Decreased Interest 0 0 1 0 1  Down, Depressed,  Hopeless 0 0 0 0 1  PHQ - 2 Score 0 0 1 0 2  Altered sleeping 0 1 2  1   Tired, decreased energy 2 1 2  1   Change in appetite 0 1 0  0  Feeling bad or failure about yourself  0 0 0  0  Trouble concentrating 0 1 0  0  Moving slowly or fidgety/restless 0 0 0  0  Suicidal thoughts 0 0 0  0  PHQ-9 Score 2  4  5   4    Difficult doing work/chores Not difficult at all Somewhat difficult Somewhat difficult  Somewhat difficult     Data saved with a previous flowsheet row definition        06/07/2024    7:50 AM  Fall Risk   Falls in the past year? 1  Number falls in past yr: 0  Injury with Fall? 0   Risk for fall due to : History of fall(s)  Follow up Falls evaluation completed     Data saved with a previous flowsheet row definition    Patient Care Team: Milon Cleaves, [place] as PCP - General (Physician Assistant)   Review of Systems  Constitutional:  Negative for appetite change, fatigue and fever.  HENT:  Negative for congestion, ear pain, sinus pressure and sore throat.   Respiratory:  Negative for cough, chest tightness, shortness of breath and wheezing.   Cardiovascular:  Negative  for chest pain and palpitations.  Gastrointestinal:  Negative for abdominal pain, constipation, diarrhea, nausea and vomiting.  Genitourinary:  Negative for dysuria and hematuria.  Musculoskeletal:  Positive for myalgias (Left knee pain). Negative for arthralgias, back pain and joint swelling.  Skin:  Negative for rash.  Neurological:  Negative for dizziness, weakness and headaches.  Psychiatric/Behavioral:  Negative for dysphoric mood. The patient is not nervous/anxious.     Medications Ordered Prior to Encounter[1] Past Medical History:  Diagnosis Date   Allergic rhinitis, seasonal    COPD (chronic obstructive pulmonary disease) (HCC)    GAD (generalized anxiety disorder)    GERD (gastroesophageal reflux disease)    History of adenomatous polyp of colon    Hyperlipidemia, mixed    Hypertension     MDD (major depressive disorder)    Mixed stress and urge urinary incontinence    OA (osteoarthritis)    OAB (overactive bladder)    Tobacco user 12/07/2023   Wears glasses    Past Surgical History:  Procedure Laterality Date   ANTERIOR CERVICAL DECOMP/DISCECTOMY FUSION  02/14/2007   @[facility]  by dr pool   BILATERAL CARPAL TUNNLE RELEASE Bilateral    1990s   BLADDER SUSPENSION N/A 02/14/2023   Procedure: TRANSVAGINAL TAPE (TVT) PROCEDURE;  Surgeon: Marilynne Rosaline SAILOR, MD;  Location: [facility];  Service: Gynecology;  Laterality: N/A;  Total time requested is 1 hour   CERVICAL SPINE SURGERY  04/28/2010   @[facility]  by dr pool;   RE-EXPLORATION C5-6 FUSION / REMOVAL HARDWARE AND ACDF C6-7   CHOLECYSTECTOMY OPEN  1981   CYSTOSCOPY N/A 02/14/2023   Procedure: CYSTOSCOPY;  Surgeon: Marilynne Rosaline SAILOR, MD;  Location: [facility];  Service: Gynecology;  Laterality: N/A;   DEBRIDEMENT TENNIS ELBOW Right    1990s   EXCISION MASS HEAD N/A 09/06/2024   Procedure: EXCISION, MASS, HEAD;  Surgeon: Lowery Estefana RAMAN, DO;  Location: [facility];  Service: Plastics;  Laterality: N/A;  Excision of forehead /skull mass   KNEE CLOSED REDUCTION Right 08/02/2018   Procedure: CLOSED MANIPULATION RIGHT TOTAL KNEE;  Surgeon: Heide Ingle, MD;  Location: [facility];  Service: Orthopedics;  Laterality: Right;    LAPAROSCOPIC ASSISTED VAGINAL HYSTERECTOMY  2003   TONSILLECTOMY     child   TOTAL KNEE ARTHROPLASTY Right 05/17/2018   Procedure: RIGHT TOTAL KNEE ARTHROPLASTY, INSERTION OF BONE GRAFT IN FEMORAL CANAL;  Surgeon: Heide Ingle, MD;  Location: [facility];  Service: Orthopedics;  Laterality: Right;   TUBAL LIGATION Bilateral    yrs ago    Family History  Problem Relation Age of Onset   Cancer Mother    Hypertension Mother    Hyperlipidemia Mother    Rectal cancer Mother    Hypertension Father    Hyperlipidemia Father    Heart Problems Father    Arthritis  Father    COPD Father    Hypertension Brother    Hypertension Paternal Aunt    Heart Problems Paternal Aunt    Hypertension Paternal Aunt    Hyperlipidemia Paternal Aunt    Hypertension Paternal Aunt    Hyperlipidemia Paternal Aunt    Skin cancer Paternal Aunt    Heart Problems Paternal Aunt    Breast cancer Maternal Grandmother    Hypertension Paternal Grandmother    Heart Problems Paternal Grandmother    Hypertension Paternal Grandfather    Stroke Paternal Grandfather    Hyperlipidemia Paternal Grandfather    Colon cancer Neg Hx  Colon polyps Neg Hx    Esophageal cancer Neg Hx    Stomach cancer Neg Hx    Social History   Socioeconomic History   Marital status: Married    Spouse name: Not on file   Number of children: Not on file   Years of education: Not on file   Highest education level: 12th grade  Occupational History   Not on file  Tobacco Use   Smoking status: Former    Current packs/day: 0.00    Average packs/day: 1 pack/day for 30.0 years (30.0 ttl pk-yrs)    Types: Cigarettes    Quit date: 06/25/2023    Years since quitting: 1.3   Smokeless tobacco: Never  Vaping Use   Vaping status: Some Days   Substances: Nicotine, Flavoring   Devices: buse  Substance and Sexual Activity   Alcohol use: Not Currently    Comment: RARE   Drug use: Never   Sexual activity: Yes    Partners: Male    Birth control/protection: Surgical  Other Topics Concern   Not on file  Social History Narrative   Not on file   Social Drivers of Health   Tobacco Use: Medium Risk (10/16/2024)   Patient History    Smoking Tobacco Use: Former    Smokeless Tobacco Use: Never    Passive Exposure: Not on Actuary Strain: Low Risk (10/15/2024)   Overall Financial Resource Strain (CARDIA)    Difficulty of Paying Living Expenses: Not hard at all  Food Insecurity: No Food Insecurity (10/15/2024)   Epic    Worried About Radiation Protection Practitioner of Food in the Last Year: Never true     Ran Out of Food in the Last Year: Never true  Transportation Needs: No Transportation Needs (10/15/2024)   Epic    Lack of Transportation (Medical): No    Lack of Transportation (Non-Medical): No  Physical Activity: Insufficiently Active (10/15/2024)   Exercise Vital Sign    Days of Exercise per Week: 3 days    Minutes of Exercise per Session: 10 min  Stress: No Stress Concern Present (10/15/2024)   [employer] of Occupational Health - Occupational Stress Questionnaire    Feeling of Stress: Only a little  Social Connections: Moderately Integrated (10/15/2024)   Social Connection and Isolation Panel    Frequency of Communication with Friends and Family: More than three times a week    Frequency of Social Gatherings with Friends and Family: More than three times a week    Attends Religious Services: More than 4 times per year    Active Member of Clubs or Organizations: No    Attends Banker Meetings: Not on file    Marital Status: Married  Depression (PHQ2-9): Low Risk (06/07/2024)   Depression (PHQ2-9)    PHQ-2 Score: 2  Alcohol Screen: Low Risk (10/15/2024)   Alcohol Screen    Last Alcohol Screening Score (AUDIT): 1  Housing: Unknown (10/15/2024)   Epic    Unable to Pay for Housing in the Last Year: No    Number of Times Moved in the Last Year: Not on file    Homeless in the Last Year: No  Utilities: Not At Risk (07/27/2022)   AHC Utilities    Threatened with loss of utilities: No  Health Literacy: Not on file    Objective:  BP 132/72 (BP Location: Left Arm, Patient Position: Sitting)   Pulse 78   Temp 97.7 F (36.5 C) (Temporal)   Ht 5'  4 (1.626 m)   Wt 195 lb (88.5 kg)   SpO2 98%   BMI 33.47 kg/m      10/16/2024   11:12 AM 10/02/2024    1:50 PM 09/11/2024    3:29 PM  BP/Weight  Systolic BP 132 142 146  Diastolic BP 72 84 86  Wt. (Lbs) 195    BMI 33.47 kg/m2      Physical Exam Vitals reviewed.  Constitutional:      Appearance: Normal  appearance.  Neck:     Vascular: No carotid bruit.  Cardiovascular:     Rate and Rhythm: Normal rate and regular rhythm.     Heart sounds: Normal heart sounds.  Pulmonary:     Effort: Pulmonary effort is normal.     Breath sounds: Normal breath sounds.  Abdominal:     General: Bowel sounds are normal.     Palpations: Abdomen is soft.     Tenderness: There is no abdominal tenderness.  Skin:    Comments: Surgical scar healing well at the forehead  Neurological:     Mental Status: She is alert and oriented to person, place, and time.  Psychiatric:        Mood and Affect: Mood normal.        Behavior: Behavior normal.       Lab Results  Component Value Date   WBC 6.8 06/07/2024   HGB 12.4 06/07/2024   HCT 38.0 06/07/2024   PLT 203 06/07/2024   GLUCOSE 103 (H) 09/03/2024   CHOL 149 06/07/2024   TRIG 96 06/07/2024   HDL 51 06/07/2024   LDLCALC 80 06/07/2024   ALT 27 08/02/2024   AST 21 08/02/2024   NA 140 09/03/2024   K 3.6 09/03/2024   CL 101 09/03/2024   CREATININE 0.71 09/03/2024   BUN 10 09/03/2024   CO2 27 09/03/2024   TSH 1.850 12/07/2023   INR 0.87 05/10/2018   HGBA1C 5.2 06/07/2024    Results for orders placed or performed during the hospital encounter of 09/06/24  Basic metabolic panel per protocol   Collection Time: 09/03/24  2:00 PM  Result Value Ref Range   Sodium 140 135 - 145 mmol/L   Potassium 3.6 3.5 - 5.1 mmol/L   Chloride 101 98 - 111 mmol/L   CO2 27 22 - 32 mmol/L   Glucose, Bld 103 (H) 70 - 99 mg/dL   BUN 10 6 - 20 mg/dL   Creatinine, Ser 9.28 0.44 - 1.00 mg/dL   Calcium  9.3 8.9 - 10.3 mg/dL   GFR, Estimated >39 >39 mL/min   Anion gap 12 5 - 15  Surgical pathology   Collection Time: 09/06/24 11:25 AM  Result Value Ref Range   SURGICAL PATHOLOGY      SURGICAL PATHOLOGY CASE: [id number] PATIENT: HERON FINDER Surgical Pathology Report     Clinical History: skull mass (cm)     FINAL MICROSCOPIC DIAGNOSIS:  A. SOFT  TISSUE MASS, SKULL, EXCISION: - Benign lamellar bone consistent with osteoma.  GROSS DESCRIPTION:  Received in formalin is a 1.1 x 1.0 x 0.4 cm fragment of tan bone.  The specimen is entirely submitted in A1 post decal. ([facility] 09/06/2024)  Final Diagnosis performed by Rexene Daily, MD.   Electronically signed 09/11/2024 Technical component performed at [facility]. [facility], [address] [place], [place] [address].  Professional component performed at [facility]. [address] [place], [place] [address]  Immunohistochemistry Technical component (if applicable) was performed  at [facility]. [address], [place], [place] [address].  IMMUNOHISTOCHEMISTRY DISCLAIMER (if applicable): Some of these immunohistochemical stains  may have been developed and the performance characteristics determine by [facility]. Some may not have been cleared or approved by the U.S. Food and Drug Administration. The FDA has determined that such clearance or approval is not necessary. This test is used for clinical purposes. It should not be regarded as investigational or for research. This laboratory is certified under the Clinical Laboratory Improvement Amendments of 1988 (CLIA-88) as qualified to perform high complexity clinical laboratory testing.  The controls stained appropriately.   .  Assessment & Plan:   Assessment & Plan Primary hypertension Controlled Denies any new or worsening symptoms Continue taking hydrochlorothiazide  25mg , Losartan  50mg  Orders:   tirzepatide  (ZEPBOUND ) 2.5 MG/0.5ML Pen; Inject 2.5 mg into the skin once a week.  Gastroesophageal reflux disease, unspecified whether esophagitis present Continue to monitor symptoms Denies any new or worsening symptoms Continue taking Nexium  40mg  as prescribed   GAD (generalized anxiety disorder) Depression and anxiety managed with escitalopram  10 mg  without reported issues.     Mixed hyperlipidemia Mixed hyperlipidemia Cholesterol levels within normal range. No immediate need for cholesterol testing. - Will plan for cholesterol testing during next year's blood work. Lab Results  Component Value Date   LDLCALC 80 06/07/2024       Prediabetes Previous A1c levels within acceptable range. No immediate need for A1c testing. - Will plan for A1c testing during next year's blood work. Lab Results  Component Value Date   HGBA1C 5.2 06/07/2024   HGBA1C 5.8 (H) 03/06/2024   HGBA1C 6.2 (H) 09/01/2023       Class 1 obesity due to excess calories with serious comorbidity and body mass index (BMI) of 32.0 to 32.9 in adult Considering Zepbound  for weight management due to previous Wegovy  side effects. Discussed benefits and side effects of Zepbound . - Initiated Zepbound  for weight management. - Provided sample of Zepbound . - Instructed to refrigerate Zepbound  and rotate injection sites weekly. - Instructed to inform orthopedist about Zepbound  use. Orders:   tirzepatide  (ZEPBOUND ) 2.5 MG/0.5ML Pen; Inject 2.5 mg into the skin once a week.  Primary osteoarthritis of left knee Knee osteoarthritis with planned total knee replacement Chronic knee osteoarthritis with significant pain and functional impairment. Scheduled for total knee replacement in February. - Proceed with total knee replacement surgery in February. - Continue using copper sleeve for knee support. - Informed orthopedist about Zepbound  use and potential infection risk.    Hypokalemia Low normal potassium levels. Potassium supplementation recommended. - Continue potassium supplementation. - Will monitor potassium levels during next year's blood work.      Body mass index is 33.47 kg/m.   No orders of the defined types were placed in this encounter.  No orders of the defined types were placed in this encounter.   Follow-up: Return in about 3 months (around  01/14/2025) for Chronic, Nola.  An After Visit Summary was printed and given to the patient.   I,Lauren M Auman,acting as a neurosurgeon for [employer], PA.,have documented all relevant documentation on the behalf of Nola Angles, PA,as directed by  Nola Angles, PA while in the presence of Nola Angles, [place].   Nola Angles, [place] [facility] [phone]      [1]  Current Outpatient Medications on File Prior to Visit  Medication Sig Dispense Refill   azelastine  (ASTELIN ) 0.1 % nasal spray Place 1 spray  into both nostrils 2 (two) times daily. Use in each nostril as directed 30 mL 12   escitalopram  (LEXAPRO ) 10 MG tablet TAKE 1 TABLET AT BEDTIME 90 tablet 3   esomeprazole  (NEXIUM ) 40 MG capsule Take 1 capsule (40 mg total) by mouth daily. 90 capsule 2   estradiol  (ESTRACE ) 0.5 MG tablet TAKE 1 TABLET DAILY 90 tablet 2   fluticasone  (FLONASE ) 50 MCG/ACT nasal spray Place 2 sprays into both nostrils 2 (two) times daily as needed for allergies. 16 g 6   hydrochlorothiazide  (HYDRODIURIL ) 25 MG tablet TAKE 1 TABLET DAILY 90 tablet 3   Melatonin 10 MG TABS Take 10 mg by mouth at bedtime as needed (for sleep).     montelukast  (SINGULAIR ) 10 MG tablet TAKE 1 TABLET AT BEDTIME 90 tablet 3   potassium chloride  SA (KLOR-CON  M) 20 MEQ tablet TAKE 1 TABLET BY MOUTH TWICE A DAY 60 tablet 3   rosuvastatin  (CRESTOR ) 5 MG tablet TAKE 1 TABLET AT BEDTIME 90 tablet 3   Trospium  Chloride 60 MG CP24 Take 1 capsule (60 mg total) by mouth daily at 8 pm. 90 capsule 0   losartan  (COZAAR ) 50 MG tablet TAKE 1 TABLET TWICE A DAY 180 tablet 1   No current facility-administered medications on file prior to visit.

## 2024-10-16 NOTE — Assessment & Plan Note (Addendum)
 Depression and anxiety managed with escitalopram  10 mg without reported issues.

## 2024-10-16 NOTE — Assessment & Plan Note (Addendum)
 Continue to monitor symptoms Denies any new or worsening symptoms Continue taking Nexium  40mg  as prescribed

## 2024-10-16 NOTE — Assessment & Plan Note (Signed)
 Knee osteoarthritis with planned total knee replacement Chronic knee osteoarthritis with significant pain and functional impairment. Scheduled for total knee replacement in February. - Proceed with total knee replacement surgery in February. - Continue using copper sleeve for knee support. - Informed orthopedist about Zepbound  use and potential infection risk.

## 2024-10-16 NOTE — Assessment & Plan Note (Addendum)
 Controlled Denies any new or worsening symptoms Continue taking hydrochlorothiazide  25mg , Losartan  50mg  Orders:   tirzepatide  (ZEPBOUND ) 2.5 MG/0.5ML Pen; Inject 2.5 mg into the skin once a week.

## 2024-10-16 NOTE — Assessment & Plan Note (Addendum)
 Mixed hyperlipidemia Cholesterol levels within normal range. No immediate need for cholesterol testing. - Will plan for cholesterol testing during next year's blood work. Lab Results  Component Value Date   LDLCALC 80 06/07/2024

## 2024-10-16 NOTE — Assessment & Plan Note (Addendum)
 Previous A1c levels within acceptable range. No immediate need for A1c testing. - Will plan for A1c testing during next year's blood work. Lab Results  Component Value Date   HGBA1C 5.2 06/07/2024   HGBA1C 5.8 (H) 03/06/2024   HGBA1C 6.2 (H) 09/01/2023

## 2024-10-16 NOTE — Assessment & Plan Note (Signed)
 Low normal potassium levels. Potassium supplementation recommended. - Continue potassium supplementation. - Will monitor potassium levels during next year's blood work.

## 2024-10-16 NOTE — Assessment & Plan Note (Addendum)
 Considering Zepbound  for weight management due to previous Wegovy  side effects. Discussed benefits and side effects of Zepbound . - Initiated Zepbound  for weight management. - Provided sample of Zepbound . - Instructed to refrigerate Zepbound  and rotate injection sites weekly. - Instructed to inform orthopedist about Zepbound  use. Orders:   tirzepatide  (ZEPBOUND ) 2.5 MG/0.5ML Pen; Inject 2.5 mg into the skin once a week.

## 2024-10-18 ENCOUNTER — Telehealth: Payer: Self-pay

## 2024-10-18 NOTE — Telephone Encounter (Addendum)
 PA HAS BEEN STARTED FOR ZEPBOUND  2.5 MG AND HAS BEEN SUBMITTED TO PLAN.

## 2024-10-19 ENCOUNTER — Encounter: Payer: Self-pay | Admitting: Physician Assistant

## 2024-10-23 ENCOUNTER — Ambulatory Visit (HOSPITAL_BASED_OUTPATIENT_CLINIC_OR_DEPARTMENT_OTHER)
Admission: EM | Admit: 2024-10-23 | Discharge: 2024-10-23 | Disposition: A | Discharge status: Home / Self Care | Attending: Family Medicine | Admitting: Family Medicine

## 2024-10-23 ENCOUNTER — Encounter (HOSPITAL_BASED_OUTPATIENT_CLINIC_OR_DEPARTMENT_OTHER): Payer: Self-pay

## 2024-10-23 DIAGNOSIS — R509 Fever, unspecified: Secondary | ICD-10-CM | POA: Diagnosis not present

## 2024-10-23 DIAGNOSIS — Z20828 Contact with and (suspected) exposure to other viral communicable diseases: Secondary | ICD-10-CM

## 2024-10-23 DIAGNOSIS — R051 Acute cough: Secondary | ICD-10-CM | POA: Diagnosis not present

## 2024-10-23 LAB — POC COVID19/FLU A&B COMBO
Covid Antigen, POC: NEGATIVE
Influenza A Antigen, POC: NEGATIVE
Influenza B Antigen, POC: NEGATIVE

## 2024-10-23 MED ORDER — OSELTAMIVIR PHOSPHATE 75 MG PO CAPS: ORAL_CAPSULE | ORAL | 0 refills | Status: AC

## 2024-10-23 MED ORDER — PROMETHAZINE-DM 6.25-15 MG/5ML PO SYRP: 5.0000 mL | ORAL_SOLUTION | Freq: Four times a day (QID) | ORAL | 0 refills | Status: AC | PRN

## 2024-10-23 MED ORDER — ACETAMINOPHEN 325 MG PO TABS
650.0000 mg | ORAL_TABLET | Freq: Once | ORAL | Status: AC
  Administered 2024-10-23: 650 mg via ORAL

## 2024-10-23 NOTE — ED Triage Notes (Signed)
 Pt reports fever and cough since this morning.

## 2024-10-23 NOTE — Discharge Instructions (Signed)
 Viral upper respiratory infection with fever cough and exposure to influenza type A: Rapid flu and COVID are negative.  Get plenty of fluids and rest.  Promethazine  DM, 5 mL, every 6 hours if needed for cough.  Take Tamiflu , 75 mg, 1 pill daily, after food, for 10 days for flu prevention.  If you take a home flu test in the next 36 hours and are positive for the flu, then switch to Tamiflu  75 mg twice daily for 5 days to treat the flu.  Follow-up if symptoms do not improve, worsen or new symptoms occur.

## 2024-10-23 NOTE — ED Provider Notes (Signed)
 " Colleen Larsen    CSN: [id number] Arrival date & time: 10/23/24  1837      History   Chief Complaint Chief Complaint  Patient presents with   Fever    HPI Colleen Larsen is a 58 y.o. female.   58 year old female with cough and fever that started on the morning of 10/23/2024.  She was exposed 2 days ago to influenza type A.   Fever Associated symptoms: cough   Associated symptoms: no chest pain, no chills, no diarrhea, no dysuria, no ear pain, no nausea, no rash, no sore throat and no vomiting     Past Medical History:  Diagnosis Date   Allergic rhinitis, seasonal    COPD (chronic obstructive pulmonary disease) (HCC)    GAD (generalized anxiety disorder)    GERD (gastroesophageal reflux disease)    History of adenomatous polyp of colon    Hyperlipidemia, mixed    Hypertension    MDD (major depressive disorder)    Mixed stress and urge urinary incontinence    OA (osteoarthritis)    OAB (overactive bladder)    Tobacco user 12/07/2023   Wears glasses     Patient Active Problem List   Diagnosis Date Noted   Acute right-sided low back pain with right-sided sciatica 07/11/2024   Nonscarring hair loss 07/11/2024   Skull mass 07/10/2024   Trigger thumb, right thumb 06/07/2024   Encounter for screening, unspecified 06/07/2024   Tobacco use 06/07/2024   Acute recurrent frontal sinusitis 06/07/2024   Prediabetes 06/07/2024   Arthralgia of right knee 05/17/2024   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   Osteoarthritis of left knee 03/06/2024   B12 deficiency 12/08/2023   Abnormal thyroid blood test 12/08/2023   Obesity with body mass index 30 or greater 12/07/2023   Osteoarthritis of patellofemoral joint 12/07/2023   Metabolic syndrome X 12/07/2023   Hypokalemia 12/07/2023   Constipation 12/07/2023   Numbness and tingling in both hands 09/22/2023   Class 1 obesity due to excess calories with body mass index (BMI) of 32.0 to 32.9 in adult 09/22/2023   Chronic pain of  both knees 09/22/2023   Sciatica 09/02/2023   Acute bacterial sinusitis 06/27/2023   Encounter for physical examination 05/25/2023   Encounter for smoking cessation counseling 05/25/2023   Menopause present 05/25/2023   Chronic sinusitis of both maxillary sinuses 05/25/2023   COPD (chronic obstructive pulmonary disease) (HCC) 12/01/2022   GAD (generalized anxiety disorder) 11/24/2022   Gastroesophageal reflux disease 11/24/2022   Hyperlipidemia 11/24/2022   Cigarette nicotine dependence with nicotine-induced disorder 11/24/2022   Allergic rhinitis 11/24/2022   Anxiety 08/19/2022   Aftercare following joint replacement surgery 07/06/2018   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/17/2018   Vitamin D  deficiency 04/05/2018   Dyslipidemia 08/21/2014   Hypertensive disorder 08/14/2014    Past Surgical History:  Procedure Laterality Date   ANTERIOR CERVICAL DECOMP/DISCECTOMY FUSION  02/14/2007   @[facility]  by dr pool   BILATERAL CARPAL TUNNLE RELEASE Bilateral    1990s   BLADDER SUSPENSION N/A 02/14/2023   Procedure: TRANSVAGINAL TAPE (TVT) PROCEDURE;  Surgeon: Marilynne Rosaline SAILOR, MD;  Location: [facility];  Service: Gynecology;  Laterality: N/A;  Total time requested is 1 hour   CERVICAL SPINE SURGERY  04/28/2010   @[facility]  by dr pool;   RE-EXPLORATION C5-6 FUSION / REMOVAL HARDWARE AND ACDF C6-7   CHOLECYSTECTOMY OPEN  1981   CYSTOSCOPY N/A 02/14/2023   Procedure: CYSTOSCOPY;  Surgeon: Marilynne Rosaline SAILOR, MD;  Location: [facility];  Service: Gynecology;  Laterality: N/A;   DEBRIDEMENT TENNIS ELBOW Right    1990s   EXCISION MASS HEAD N/A 09/06/2024   Procedure: EXCISION, MASS, HEAD;  Surgeon: Lowery Estefana RAMAN, DO;  Location: [facility];  Service: Plastics;  Laterality: N/A;  Excision of forehead /skull mass   KNEE CLOSED REDUCTION Right 08/02/2018   Procedure: CLOSED MANIPULATION RIGHT TOTAL KNEE;  Surgeon: Heide Ingle, MD;  Location: [facility];  Service: Orthopedics;  Laterality: Right;    LAPAROSCOPIC ASSISTED VAGINAL HYSTERECTOMY  2003   TONSILLECTOMY     child   TOTAL KNEE ARTHROPLASTY Right 05/17/2018   Procedure: RIGHT TOTAL KNEE ARTHROPLASTY, INSERTION OF BONE GRAFT IN FEMORAL CANAL;  Surgeon: Heide Ingle, MD;  Location: [facility];  Service: Orthopedics;  Laterality: Right;   TUBAL LIGATION Bilateral    yrs ago    OB History     Gravida  3   Para  2   Term  2   Preterm      AB  1   Living  2      SAB  1   IAB      Ectopic      Multiple      Live Births  2            Home Medications    Prior to Admission medications  Medication Sig Start Date End Date Taking? Authorizing Provider  oseltamivir  (TAMIFLU ) 75 MG capsule Take Tamiflu , 75 mg, 1 pill daily for 10 days for prevention.  If you take a home flu test and become positive in the next 48 hours, take Tamiflu  75 mg twice daily for 5 days.  Always take the Tamiflu  after food. 10/23/24  Yes Ival Domino, FNP  promethazine -dextromethorphan (PROMETHAZINE -DM) 6.25-15 MG/5ML syrup Take 5 mLs by mouth 4 (four) times daily as needed for cough. Do not use and drive - May make drowsy. 10/23/24  Yes Ival Domino, FNP  azelastine  (ASTELIN ) 0.1 % nasal spray Place 1 spray into both nostrils 2 (two) times daily. Use in each nostril as directed 07/10/24   Milon Cleaves, PA  escitalopram  (LEXAPRO ) 10 MG tablet TAKE 1 TABLET AT BEDTIME 06/20/24   Milon Cleaves, PA  esomeprazole  (NEXIUM ) 40 MG capsule Take 1 capsule (40 mg total) by mouth daily. 08/09/24   Milon Cleaves, PA  estradiol  (ESTRACE ) 0.5 MG tablet TAKE 1 TABLET DAILY 02/07/24   Milon Cleaves, PA  fluticasone  (FLONASE ) 50 MCG/ACT nasal spray Place 2 sprays into both nostrils 2 (two) times daily as needed for allergies. 07/10/24   Milon Cleaves, PA  hydrochlorothiazide  (HYDRODIURIL ) 25 MG tablet TAKE 1 TABLET DAILY 07/04/24   Milon Cleaves, PA  losartan  (COZAAR ) 50 MG tablet TAKE 1 TABLET TWICE A DAY 10/05/24    Milon Cleaves, PA  Melatonin 10 MG TABS Take 10 mg by mouth at bedtime as needed (for sleep).    [provider]  montelukast  (SINGULAIR ) 10 MG tablet TAKE 1 TABLET AT BEDTIME 01/13/24   Milon Cleaves, PA  potassium chloride  SA (KLOR-CON  M) 20 MEQ tablet TAKE 1 TABLET BY MOUTH TWICE A DAY 09/03/24   Milon Cleaves, PA  rosuvastatin  (CRESTOR ) 5 MG tablet TAKE 1 TABLET AT BEDTIME 07/04/24   Milon Cleaves, PA  tirzepatide  (ZEPBOUND ) 2.5 MG/0.5ML Pen Inject 2.5 mg into the skin once a week. 10/16/24   Milon Cleaves, PA  Trospium  Chloride 60 MG CP24 Take 1 capsule (60 mg total) by mouth daily  at 8 pm. 04/17/24   Zuleta, Kaitlin G, NP    Family History Family History  Problem Relation Age of Onset   Cancer Mother    Hypertension Mother    Hyperlipidemia Mother    Rectal cancer Mother    Hypertension Father    Hyperlipidemia Father    Heart Problems Father    Arthritis Father    COPD Father    Hypertension Brother    Hypertension Paternal Aunt    Heart Problems Paternal Aunt    Hypertension Paternal Aunt    Hyperlipidemia Paternal Aunt    Hypertension Paternal Aunt    Hyperlipidemia Paternal Aunt    Skin cancer Paternal Aunt    Heart Problems Paternal Aunt    Breast cancer Maternal Grandmother    Hypertension Paternal Grandmother    Heart Problems Paternal Grandmother    Hypertension Paternal Grandfather    Stroke Paternal Grandfather    Hyperlipidemia Paternal Grandfather    Colon cancer Neg Hx    Colon polyps Neg Hx    Esophageal cancer Neg Hx    Stomach cancer Neg Hx     Social History Social History[1]   Allergies   Celebrex [celecoxib]   Review of Systems Review of Systems  Constitutional:  Positive for fever. Negative for chills.  HENT:  Negative for ear pain and sore throat.   Eyes:  Negative for pain and visual disturbance.  Respiratory:  Positive for cough. Negative for shortness of breath.   Cardiovascular:  Negative for chest pain and palpitations.   Gastrointestinal:  Negative for abdominal pain, constipation, diarrhea, nausea and vomiting.  Genitourinary:  Negative for dysuria and hematuria.  Musculoskeletal:  Negative for arthralgias and back pain.  Skin:  Negative for color change and rash.  Neurological:  Negative for seizures and syncope.  All other systems reviewed and are negative.    Physical Exam Triage Vital Signs ED Triage Vitals  Encounter Vitals Group     BP 10/23/24 1854 (!) 161/75     Girls Systolic BP Percentile --      Girls Diastolic BP Percentile --      Boys Systolic BP Percentile --      Boys Diastolic BP Percentile --      Pulse Rate 10/23/24 1854 (!) 112     Resp 10/23/24 1854 18     Temp 10/23/24 1854 (!) 101.5 F (38.6 C)     Temp Source 10/23/24 1854 Oral     SpO2 10/23/24 1854 96 %     Weight --      Height --      Head Circumference --      Peak Flow --      Pain Score 10/23/24 1853 0     Pain Loc --      Pain Education --      Exclude from Growth Chart --    No data found.  Updated Vital Signs BP (!) 161/75 (BP Location: Right Arm)   Pulse (!) 112   Temp (!) 101.5 F (38.6 C) (Oral)   Resp 18   SpO2 96%   Visual Acuity Right Eye Distance:   Left Eye Distance:   Bilateral Distance:    Right Eye Near:   Left Eye Near:    Bilateral Near:     Physical Exam Vitals and nursing note reviewed.  Constitutional:      General: She is not in acute distress.    Appearance: She is well-developed. She is  ill-appearing. She is not toxic-appearing or diaphoretic.  HENT:     Head: Normocephalic and atraumatic.     Right Ear: Hearing, tympanic membrane, ear canal and external ear normal.     Left Ear: Hearing, tympanic membrane, ear canal and external ear normal.     Nose: Congestion and rhinorrhea present. Rhinorrhea is clear.     Right Sinus: No maxillary sinus tenderness or frontal sinus tenderness.     Left Sinus: No maxillary sinus tenderness or frontal sinus tenderness.      Mouth/Throat:     Lips: Pink.     Mouth: Mucous membranes are moist.     Pharynx: Uvula midline. No oropharyngeal exudate or posterior oropharyngeal erythema.     Tonsils: No tonsillar exudate.  Eyes:     Conjunctiva/sclera: Conjunctivae normal.     Pupils: Pupils are equal, round, and reactive to light.  Cardiovascular:     Rate and Rhythm: Normal rate and regular rhythm.     Heart sounds: S1 normal and S2 normal. No murmur heard. Pulmonary:     Effort: Pulmonary effort is normal. No respiratory distress.     Breath sounds: Normal breath sounds. No decreased breath sounds, wheezing, rhonchi or rales.  Abdominal:     General: Bowel sounds are normal.     Palpations: Abdomen is soft.     Tenderness: There is no abdominal tenderness.  Musculoskeletal:        General: No swelling.     Cervical back: Neck supple.  Lymphadenopathy:     Head:     Right side of head: No submental, submandibular, tonsillar, preauricular or posterior auricular adenopathy.     Left side of head: No submental, submandibular, tonsillar, preauricular or posterior auricular adenopathy.     Cervical: Cervical adenopathy present.     Right cervical: Superficial cervical adenopathy present.     Left cervical: Superficial cervical adenopathy present.  Skin:    General: Skin is warm.     Capillary Refill: Capillary refill takes less than 2 seconds.     Findings: No rash.  Neurological:     Mental Status: She is alert and oriented to person, place, and time.  Psychiatric:        Mood and Affect: Mood normal.      UC Treatments / Results  Labs (all labs ordered are listed, but only abnormal results are displayed) Labs Reviewed  POC COVID19/FLU A&B COMBO - Normal    EKG   Radiology No results found.  Procedures Procedures (including critical Larsen time)  Medications Ordered in UC Medications  acetaminophen  (TYLENOL ) tablet 650 mg (650 mg Oral Given 10/23/24 1858)    Initial Impression /  Assessment and Plan / UC Course  I have reviewed the triage vital signs and the nursing notes.  Pertinent labs & imaging results that were available during my Larsen of the patient were reviewed by me and considered in my medical decision making (see chart for details).  Plan of Larsen (see discharge instructions for additional patient precautions and education): Viral upper respiratory infection with fever cough and exposure to influenza type A: Rapid flu and COVID are negative.  Get plenty of fluids and rest.  Promethazine  DM, 5 mL, every 6 hours if needed for cough.  Take Tamiflu , 75 mg, 1 pill daily, after food, for 10 days for flu prevention.  If you take a home flu test in the next 36 hours and are positive for the flu, then switch to Tamiflu   75 mg twice daily for 5 days to treat the flu.  Follow-up if symptoms do not improve, worsen or new symptoms occur.  I reviewed the plan of Larsen with the patient and/or the patient's guardian.  The patient and/or guardian had time to ask questions and acknowledged that the questions were answered.  Final Clinical Impressions(s) / UC Diagnoses   Final diagnoses:  Fever, unspecified  Acute cough  Exposure to influenza     Discharge Instructions      Viral upper respiratory infection with fever cough and exposure to influenza type A: Rapid flu and COVID are negative.  Get plenty of fluids and rest.  Promethazine  DM, 5 mL, every 6 hours if needed for cough.  Take Tamiflu , 75 mg, 1 pill daily, after food, for 10 days for flu prevention.  If you take a home flu test in the next 36 hours and are positive for the flu, then switch to Tamiflu  75 mg twice daily for 5 days to treat the flu.  Follow-up if symptoms do not improve, worsen or new symptoms occur.     ED Prescriptions     Medication Sig Dispense Auth. Provider   promethazine -dextromethorphan (PROMETHAZINE -DM) 6.25-15 MG/5ML syrup Take 5 mLs by mouth 4 (four) times daily as needed for cough. Do  not use and drive - May make drowsy. 118 mL Ival Domino, FNP   oseltamivir  (TAMIFLU ) 75 MG capsule Take Tamiflu , 75 mg, 1 pill daily for 10 days for prevention.  If you take a home flu test and become positive in the next 48 hours, take Tamiflu  75 mg twice daily for 5 days.  Always take the Tamiflu  after food. 10 capsule Ival Domino, FNP      PDMP not reviewed this encounter.    [1]  Social History Tobacco Use   Smoking status: Former    Current packs/day: 0.00    Average packs/day: 1 pack/day for 30.0 years (30.0 ttl pk-yrs)    Types: Cigarettes    Quit date: 06/25/2023    Years since quitting: 1.3   Smokeless tobacco: Never  Vaping Use   Vaping status: Some Days   Substances: Nicotine, Flavoring   Devices: buse  Substance Use Topics   Alcohol use: Not Currently    Comment: RARE   Drug use: Never     Ival Domino, FNP 10/23/24 1944  "

## 2024-10-27 ENCOUNTER — Other Ambulatory Visit: Payer: Self-pay | Admitting: Physician Assistant

## 2024-10-27 DIAGNOSIS — N952 Postmenopausal atrophic vaginitis: Secondary | ICD-10-CM

## 2024-10-29 ENCOUNTER — Encounter: Payer: Self-pay | Admitting: Physician Assistant

## 2024-11-12 ENCOUNTER — Other Ambulatory Visit: Payer: Self-pay

## 2024-11-12 DIAGNOSIS — R899 Unspecified abnormal finding in specimens from other organs, systems and tissues: Secondary | ICD-10-CM

## 2024-11-12 DIAGNOSIS — E876 Hypokalemia: Secondary | ICD-10-CM

## 2024-11-13 ENCOUNTER — Other Ambulatory Visit

## 2024-11-13 DIAGNOSIS — R899 Unspecified abnormal finding in specimens from other organs, systems and tissues: Secondary | ICD-10-CM

## 2024-11-13 DIAGNOSIS — E876 Hypokalemia: Secondary | ICD-10-CM

## 2024-11-13 LAB — CBC WITH DIFFERENTIAL/PLATELET
Basophils Absolute: 0 x10E3/uL (ref 0.0–0.2)
Basos: 0 %
EOS (ABSOLUTE): 0.1 x10E3/uL (ref 0.0–0.4)
Eos: 2 %
Hematocrit: 38.2 % (ref 34.0–46.6)
Hemoglobin: 12.4 g/dL (ref 11.1–15.9)
Immature Grans (Abs): 0 x10E3/uL (ref 0.0–0.1)
Immature Granulocytes: 0 %
Lymphocytes Absolute: 2 x10E3/uL (ref 0.7–3.1)
Lymphs: 27 %
MCH: 29.7 pg (ref 26.6–33.0)
MCHC: 32.5 g/dL (ref 31.5–35.7)
MCV: 92 fL (ref 79–97)
Monocytes Absolute: 0.5 x10E3/uL (ref 0.1–0.9)
Monocytes: 7 %
Neutrophils Absolute: 4.7 x10E3/uL (ref 1.4–7.0)
Neutrophils: 64 %
Platelets: 239 x10E3/uL (ref 150–450)
RBC: 4.17 x10E6/uL (ref 3.77–5.28)
RDW: 13.7 % (ref 11.7–15.4)
WBC: 7.3 x10E3/uL (ref 3.4–10.8)

## 2024-11-13 LAB — COMPREHENSIVE METABOLIC PANEL WITH GFR
ALT: 16 IU/L (ref 0–32)
AST: 21 IU/L (ref 0–40)
Albumin: 4.2 g/dL (ref 3.8–4.9)
Alkaline Phosphatase: 99 IU/L (ref 49–135)
BUN/Creatinine Ratio: 20 (ref 12–28)
BUN: 15 mg/dL (ref 8–27)
Bilirubin Total: 0.3 mg/dL (ref 0.0–1.2)
CO2: 25 mmol/L (ref 20–29)
Calcium: 9.3 mg/dL (ref 8.7–10.3)
Chloride: 102 mmol/L (ref 96–106)
Creatinine, Ser: 0.74 mg/dL (ref 0.57–1.00)
Globulin, Total: 2.3 g/dL (ref 1.5–4.5)
Glucose: 96 mg/dL (ref 70–99)
Potassium: 3.3 mmol/L — ABNORMAL LOW (ref 3.5–5.2)
Sodium: 143 mmol/L (ref 134–144)
Total Protein: 6.5 g/dL (ref 6.0–8.5)
eGFR: 93 mL/min/1.73

## 2024-11-14 ENCOUNTER — Ambulatory Visit: Payer: Self-pay | Admitting: Physician Assistant

## 2024-12-05 ENCOUNTER — Ambulatory Visit

## 2024-12-05 DIAGNOSIS — Z23 Encounter for immunization: Secondary | ICD-10-CM

## 2025-01-16 ENCOUNTER — Ambulatory Visit: Admitting: Physician Assistant
